# Patient Record
Sex: Female | Born: 1940 | Race: White | Hispanic: No | Smoking: Never smoker
Health system: Southern US, Community
[De-identification: ages and names within clinical notes are randomized; demographics above are authoritative.]

## PROBLEM LIST (undated history)

## (undated) DIAGNOSIS — I482 Chronic atrial fibrillation, unspecified: Secondary | ICD-10-CM

## (undated) DIAGNOSIS — I10 Essential (primary) hypertension: Secondary | ICD-10-CM

## (undated) HISTORY — DX: Chronic atrial fibrillation, unspecified: I48.20

## (undated) HISTORY — DX: Essential (primary) hypertension: I10

## (undated) HISTORY — PX: OTHER SURGICAL HISTORY: SHX169

---

## 2001-11-09 ENCOUNTER — Encounter: Payer: Self-pay | Admitting: Ophthalmology

## 2001-11-09 ENCOUNTER — Ambulatory Visit (HOSPITAL_COMMUNITY): Admission: RE | Admit: 2001-11-09 | Discharge: 2001-11-10 | Payer: Self-pay | Admitting: Ophthalmology

## 2003-09-19 ENCOUNTER — Ambulatory Visit (HOSPITAL_COMMUNITY): Admission: RE | Admit: 2003-09-19 | Discharge: 2003-09-19 | Payer: Self-pay | Admitting: Family Medicine

## 2014-09-19 DIAGNOSIS — H52222 Regular astigmatism, left eye: Secondary | ICD-10-CM | POA: Diagnosis not present

## 2014-09-19 DIAGNOSIS — H338 Other retinal detachments: Secondary | ICD-10-CM | POA: Diagnosis not present

## 2014-09-19 DIAGNOSIS — H5202 Hypermetropia, left eye: Secondary | ICD-10-CM | POA: Diagnosis not present

## 2014-09-20 DIAGNOSIS — R5383 Other fatigue: Secondary | ICD-10-CM | POA: Diagnosis not present

## 2014-09-20 DIAGNOSIS — I10 Essential (primary) hypertension: Secondary | ICD-10-CM | POA: Diagnosis not present

## 2014-09-20 DIAGNOSIS — Z6823 Body mass index (BMI) 23.0-23.9, adult: Secondary | ICD-10-CM | POA: Diagnosis not present

## 2014-09-20 DIAGNOSIS — F419 Anxiety disorder, unspecified: Secondary | ICD-10-CM | POA: Diagnosis not present

## 2014-10-03 DIAGNOSIS — Z6823 Body mass index (BMI) 23.0-23.9, adult: Secondary | ICD-10-CM | POA: Diagnosis not present

## 2014-10-03 DIAGNOSIS — F321 Major depressive disorder, single episode, moderate: Secondary | ICD-10-CM | POA: Diagnosis not present

## 2014-10-18 ENCOUNTER — Other Ambulatory Visit (HOSPITAL_COMMUNITY): Payer: Self-pay | Admitting: Family Medicine

## 2014-10-19 ENCOUNTER — Other Ambulatory Visit: Payer: Self-pay | Admitting: Family Medicine

## 2014-10-19 DIAGNOSIS — R0989 Other specified symptoms and signs involving the circulatory and respiratory systems: Secondary | ICD-10-CM

## 2014-10-20 ENCOUNTER — Other Ambulatory Visit: Payer: Self-pay | Admitting: Family Medicine

## 2014-10-23 ENCOUNTER — Ambulatory Visit
Admission: RE | Admit: 2014-10-23 | Discharge: 2014-10-23 | Disposition: A | Discharge status: Home / Self Care | Payer: Commercial Managed Care - HMO | Source: Ambulatory Visit | Attending: Family Medicine | Admitting: Family Medicine

## 2014-10-23 DIAGNOSIS — R0989 Other specified symptoms and signs involving the circulatory and respiratory systems: Secondary | ICD-10-CM

## 2014-10-23 DIAGNOSIS — E042 Nontoxic multinodular goiter: Secondary | ICD-10-CM | POA: Diagnosis not present

## 2014-11-01 ENCOUNTER — Ambulatory Visit (HOSPITAL_COMMUNITY): Payer: Commercial Managed Care - HMO

## 2014-11-01 ENCOUNTER — Other Ambulatory Visit (HOSPITAL_COMMUNITY): Payer: Self-pay | Admitting: Internal Medicine

## 2014-11-01 ENCOUNTER — Ambulatory Visit
Admission: RE | Admit: 2014-11-01 | Discharge: 2014-11-01 | Disposition: A | Discharge status: Home / Self Care | Payer: Commercial Managed Care - HMO | Source: Ambulatory Visit | Attending: Internal Medicine | Admitting: Internal Medicine

## 2014-11-01 DIAGNOSIS — R011 Cardiac murmur, unspecified: Secondary | ICD-10-CM

## 2014-11-01 DIAGNOSIS — I10 Essential (primary) hypertension: Secondary | ICD-10-CM | POA: Diagnosis not present

## 2014-11-01 DIAGNOSIS — R0989 Other specified symptoms and signs involving the circulatory and respiratory systems: Secondary | ICD-10-CM | POA: Diagnosis not present

## 2014-11-01 DIAGNOSIS — F419 Anxiety disorder, unspecified: Secondary | ICD-10-CM | POA: Diagnosis not present

## 2014-11-01 DIAGNOSIS — Z6823 Body mass index (BMI) 23.0-23.9, adult: Secondary | ICD-10-CM | POA: Diagnosis not present

## 2014-11-01 DIAGNOSIS — E781 Pure hyperglyceridemia: Secondary | ICD-10-CM | POA: Diagnosis not present

## 2014-11-01 DIAGNOSIS — F329 Major depressive disorder, single episode, unspecified: Secondary | ICD-10-CM | POA: Diagnosis not present

## 2015-03-16 DIAGNOSIS — Z6821 Body mass index (BMI) 21.0-21.9, adult: Secondary | ICD-10-CM | POA: Diagnosis not present

## 2015-03-16 DIAGNOSIS — Z1389 Encounter for screening for other disorder: Secondary | ICD-10-CM | POA: Diagnosis not present

## 2015-03-16 DIAGNOSIS — L989 Disorder of the skin and subcutaneous tissue, unspecified: Secondary | ICD-10-CM | POA: Diagnosis not present

## 2015-03-21 DIAGNOSIS — B9689 Other specified bacterial agents as the cause of diseases classified elsewhere: Secondary | ICD-10-CM | POA: Diagnosis not present

## 2015-03-21 DIAGNOSIS — L02821 Furuncle of head [any part, except face]: Secondary | ICD-10-CM | POA: Diagnosis not present

## 2015-03-21 DIAGNOSIS — L281 Prurigo nodularis: Secondary | ICD-10-CM | POA: Diagnosis not present

## 2015-03-27 DIAGNOSIS — F329 Major depressive disorder, single episode, unspecified: Secondary | ICD-10-CM | POA: Diagnosis not present

## 2015-03-27 DIAGNOSIS — I10 Essential (primary) hypertension: Secondary | ICD-10-CM | POA: Diagnosis not present

## 2015-03-27 DIAGNOSIS — F419 Anxiety disorder, unspecified: Secondary | ICD-10-CM | POA: Diagnosis not present

## 2015-03-27 DIAGNOSIS — Z6821 Body mass index (BMI) 21.0-21.9, adult: Secondary | ICD-10-CM | POA: Diagnosis not present

## 2015-03-27 DIAGNOSIS — Z1211 Encounter for screening for malignant neoplasm of colon: Secondary | ICD-10-CM | POA: Diagnosis not present

## 2015-03-27 DIAGNOSIS — E559 Vitamin D deficiency, unspecified: Secondary | ICD-10-CM | POA: Diagnosis not present

## 2015-03-27 DIAGNOSIS — Z1389 Encounter for screening for other disorder: Secondary | ICD-10-CM | POA: Diagnosis not present

## 2015-04-19 DIAGNOSIS — L570 Actinic keratosis: Secondary | ICD-10-CM | POA: Diagnosis not present

## 2015-04-19 DIAGNOSIS — L720 Epidermal cyst: Secondary | ICD-10-CM | POA: Diagnosis not present

## 2015-04-19 DIAGNOSIS — L821 Other seborrheic keratosis: Secondary | ICD-10-CM | POA: Diagnosis not present

## 2015-04-27 DIAGNOSIS — F419 Anxiety disorder, unspecified: Secondary | ICD-10-CM | POA: Diagnosis not present

## 2015-04-27 DIAGNOSIS — Z1389 Encounter for screening for other disorder: Secondary | ICD-10-CM | POA: Diagnosis not present

## 2015-04-27 DIAGNOSIS — F329 Major depressive disorder, single episode, unspecified: Secondary | ICD-10-CM | POA: Diagnosis not present

## 2015-04-27 DIAGNOSIS — I10 Essential (primary) hypertension: Secondary | ICD-10-CM | POA: Diagnosis not present

## 2015-04-27 DIAGNOSIS — H3320 Serous retinal detachment, unspecified eye: Secondary | ICD-10-CM | POA: Diagnosis not present

## 2015-04-27 DIAGNOSIS — Z6821 Body mass index (BMI) 21.0-21.9, adult: Secondary | ICD-10-CM | POA: Diagnosis not present

## 2015-05-07 DIAGNOSIS — H43812 Vitreous degeneration, left eye: Secondary | ICD-10-CM | POA: Diagnosis not present

## 2015-05-07 DIAGNOSIS — H35351 Cystoid macular degeneration, right eye: Secondary | ICD-10-CM | POA: Diagnosis not present

## 2015-05-07 DIAGNOSIS — H35372 Puckering of macula, left eye: Secondary | ICD-10-CM | POA: Diagnosis not present

## 2015-05-07 DIAGNOSIS — H33312 Horseshoe tear of retina without detachment, left eye: Secondary | ICD-10-CM | POA: Diagnosis not present

## 2015-07-20 DIAGNOSIS — L218 Other seborrheic dermatitis: Secondary | ICD-10-CM | POA: Diagnosis not present

## 2015-11-14 DIAGNOSIS — Z6821 Body mass index (BMI) 21.0-21.9, adult: Secondary | ICD-10-CM | POA: Diagnosis not present

## 2015-11-14 DIAGNOSIS — F330 Major depressive disorder, recurrent, mild: Secondary | ICD-10-CM | POA: Diagnosis not present

## 2015-11-14 DIAGNOSIS — F419 Anxiety disorder, unspecified: Secondary | ICD-10-CM | POA: Diagnosis not present

## 2015-11-14 DIAGNOSIS — Z0001 Encounter for general adult medical examination with abnormal findings: Secondary | ICD-10-CM | POA: Diagnosis not present

## 2015-11-14 DIAGNOSIS — Z1389 Encounter for screening for other disorder: Secondary | ICD-10-CM | POA: Diagnosis not present

## 2015-11-14 DIAGNOSIS — I10 Essential (primary) hypertension: Secondary | ICD-10-CM | POA: Diagnosis not present

## 2015-11-14 DIAGNOSIS — F329 Major depressive disorder, single episode, unspecified: Secondary | ICD-10-CM | POA: Diagnosis not present

## 2015-12-19 DIAGNOSIS — H33312 Horseshoe tear of retina without detachment, left eye: Secondary | ICD-10-CM | POA: Diagnosis not present

## 2015-12-19 DIAGNOSIS — H43812 Vitreous degeneration, left eye: Secondary | ICD-10-CM | POA: Diagnosis not present

## 2015-12-19 DIAGNOSIS — H35351 Cystoid macular degeneration, right eye: Secondary | ICD-10-CM | POA: Diagnosis not present

## 2015-12-19 DIAGNOSIS — H35373 Puckering of macula, bilateral: Secondary | ICD-10-CM | POA: Diagnosis not present

## 2016-04-25 DIAGNOSIS — S93402A Sprain of unspecified ligament of left ankle, initial encounter: Secondary | ICD-10-CM | POA: Diagnosis not present

## 2016-04-25 DIAGNOSIS — Z681 Body mass index (BMI) 19 or less, adult: Secondary | ICD-10-CM | POA: Diagnosis not present

## 2016-05-21 ENCOUNTER — Emergency Department (HOSPITAL_COMMUNITY): Payer: Commercial Managed Care - HMO

## 2016-05-21 ENCOUNTER — Emergency Department (HOSPITAL_COMMUNITY)
Admission: EM | Admit: 2016-05-21 | Discharge: 2016-05-21 | Disposition: A | Discharge status: Home / Self Care | Payer: Commercial Managed Care - HMO | Attending: Emergency Medicine | Admitting: Emergency Medicine

## 2016-05-21 ENCOUNTER — Encounter (HOSPITAL_COMMUNITY): Payer: Self-pay

## 2016-05-21 DIAGNOSIS — Y999 Unspecified external cause status: Secondary | ICD-10-CM | POA: Insufficient documentation

## 2016-05-21 DIAGNOSIS — W109XXA Fall (on) (from) unspecified stairs and steps, initial encounter: Secondary | ICD-10-CM | POA: Diagnosis not present

## 2016-05-21 DIAGNOSIS — Y9301 Activity, walking, marching and hiking: Secondary | ICD-10-CM | POA: Insufficient documentation

## 2016-05-21 DIAGNOSIS — Z79899 Other long term (current) drug therapy: Secondary | ICD-10-CM | POA: Insufficient documentation

## 2016-05-21 DIAGNOSIS — S8391XA Sprain of unspecified site of right knee, initial encounter: Secondary | ICD-10-CM | POA: Diagnosis not present

## 2016-05-21 DIAGNOSIS — M25561 Pain in right knee: Secondary | ICD-10-CM | POA: Diagnosis not present

## 2016-05-21 DIAGNOSIS — Y929 Unspecified place or not applicable: Secondary | ICD-10-CM | POA: Diagnosis not present

## 2016-05-21 DIAGNOSIS — S8991XA Unspecified injury of right lower leg, initial encounter: Secondary | ICD-10-CM | POA: Diagnosis not present

## 2016-05-21 MED ORDER — NAPROXEN 500 MG PO TABS: 500.0000 mg | ORAL_TABLET | Freq: Two times a day (BID) | ORAL | 0 refills | Status: DC

## 2016-05-21 NOTE — ED Triage Notes (Signed)
Pt reports was walking down the stairs yesterday, missed the last step, and fell.  C/O pain to r knee.  Swelling noted.

## 2016-05-21 NOTE — ED Provider Notes (Signed)
[facility] Provider Note   CSN: [id number] Arrival date & time: 05/21/16  1823     History   Chief Complaint Chief Complaint  Patient presents with  . Fall    HPI Lauren Baker is a 75 y.o. female presenting for evaluation of persistent right knee pain and posterior knee swelling since falling yesterday evening.  She was going down a flight of steps when she missed the last step causing a forward fall landing directly on the flexed knee onto hard flooring but believes she twisted it during the fall. She has been able to ambulate and denies popping, clicking or deformity but woke with worsened pain today.  There is no radiation of pain. She has used ice today with some relief.  She denies other injury including head injury and had no loc or dizziness during the event.  The history is provided by the patient and a relative.    History reviewed. No pertinent past medical history.  There are no active problems to display for this patient.   Past Surgical History:  Procedure Laterality Date  . detatched retina      OB History    No data available       Home Medications    Prior to Admission medications   Medication Sig Start Date End Date Taking? Authorizing Provider  naproxen (NAPROSYN) 500 MG tablet Take 1 tablet (500 mg total) by mouth 2 (two) times daily. 05/21/16   Burgess Amor, PA-C    Family History No family history on file.  Social History Social History  Substance Use Topics  . Smoking status: Never Smoker  . Smokeless tobacco: Never Used  . Alcohol use No     Allergies   Review of patient's allergies indicates no known allergies.   Review of Systems Review of Systems  Constitutional: Negative for fever.  Musculoskeletal: Positive for arthralgias and joint swelling. Negative for myalgias.  Neurological: Negative for weakness and numbness.     Physical Exam Updated Vital Signs BP 163/94   Pulse 93   Temp 98.4 F (36.9 C) (Oral)    Resp 18   Ht 5\' 9"  (1.753 m)   Wt 72.6 kg   SpO2 99%   BMI 23.63 kg/m   Physical Exam  Constitutional: She appears well-developed and well-nourished.  HENT:  Head: Atraumatic.  Neck: Normal range of motion.  Cardiovascular:  Pulses equal bilaterally  Musculoskeletal: She exhibits tenderness.       Right knee: She exhibits swelling. She exhibits no effusion, no ecchymosis, no erythema, normal alignment, no LCL laxity, no bony tenderness, normal meniscus and no MCL laxity.  Joint appears stable with no pain or laxity with valgus or varus strain. ttp with swelling noted in popliteal space.  No crepitus with ROM of joint.   Neurological: She is alert. She has normal strength. She displays normal reflexes. No sensory deficit.  Skin: Skin is warm and dry.  Psychiatric: She has a normal mood and affect.     ED Treatments / Results  Labs (all labs ordered are listed, but only abnormal results are displayed) Labs Reviewed - No data to display  EKG  EKG Interpretation None       Radiology Dg Knee Complete 4 Views Right  Result Date: 05/21/2016 CLINICAL DATA:  Recent fall with knee pain, initial encounter EXAM: RIGHT KNEE - COMPLETE 4+ VIEW COMPARISON:  None. FINDINGS: No evidence of fracture, dislocation, or joint effusion. No evidence of arthropathy or other focal  bone abnormality. Soft tissues are unremarkable. IMPRESSION: No acute abnormality noted. Electronically Signed   By: Alcide CleverMark  Lukens M.D.   On: 05/21/2016 19:12    Procedures Procedures (including critical care time)  Medications Ordered in ED Medications - No data to display   Initial Impression / Assessment and Plan / ED Course  I have reviewed the triage vital signs and the nursing notes.  Pertinent labs & imaging results that were available during my care of the patient were reviewed by me and considered in my medical decision making (see chart for details).  Clinical Course    Imaging reviewed and  negative for fracture. No obvious ligament tear or meniscal injury on todays exam.  Pt was advised RICE,  Ace provided.  Plan f/u with her pcp in one week for a recheck for any worsened or persistent sx.   Final Clinical Impressions(s) / ED Diagnoses   Final diagnoses:  Sprain of right knee, unspecified ligament, initial encounter    New Prescriptions New Prescriptions   NAPROXEN (NAPROSYN) 500 MG TABLET    Take 1 tablet (500 mg total) by mouth 2 (two) times daily.     Burgess AmorJulie Ziya Coonrod, PA-C 05/21/16 1936    Bethann BerkshireJoseph Zammit, MD 05/21/16 [phone]

## 2016-05-21 NOTE — Discharge Instructions (Signed)
Wear the ace wrap and minimize activities that worsen your pain.  Use ice and elevation as much as possible for the next several days to help reduce the swelling.  Take the medication prescribed for pain and inflammation.  Call your doctor listed for a recheck of your injury in 1 week if not improving.  Your xrays are negative today.

## 2016-11-24 DIAGNOSIS — Z1389 Encounter for screening for other disorder: Secondary | ICD-10-CM | POA: Diagnosis not present

## 2016-11-24 DIAGNOSIS — Z7689 Persons encountering health services in other specified circumstances: Secondary | ICD-10-CM | POA: Diagnosis not present

## 2016-11-24 DIAGNOSIS — F419 Anxiety disorder, unspecified: Secondary | ICD-10-CM | POA: Diagnosis not present

## 2016-11-24 DIAGNOSIS — F330 Major depressive disorder, recurrent, mild: Secondary | ICD-10-CM | POA: Diagnosis not present

## 2016-11-24 DIAGNOSIS — Z6823 Body mass index (BMI) 23.0-23.9, adult: Secondary | ICD-10-CM | POA: Diagnosis not present

## 2017-05-27 DIAGNOSIS — F419 Anxiety disorder, unspecified: Secondary | ICD-10-CM | POA: Diagnosis not present

## 2017-05-27 DIAGNOSIS — Z79891 Long term (current) use of opiate analgesic: Secondary | ICD-10-CM | POA: Diagnosis not present

## 2017-05-27 DIAGNOSIS — I10 Essential (primary) hypertension: Secondary | ICD-10-CM | POA: Diagnosis not present

## 2017-05-27 DIAGNOSIS — Z6823 Body mass index (BMI) 23.0-23.9, adult: Secondary | ICD-10-CM | POA: Diagnosis not present

## 2017-09-22 DIAGNOSIS — F419 Anxiety disorder, unspecified: Secondary | ICD-10-CM | POA: Diagnosis not present

## 2017-09-22 DIAGNOSIS — Z6823 Body mass index (BMI) 23.0-23.9, adult: Secondary | ICD-10-CM | POA: Diagnosis not present

## 2018-03-10 DIAGNOSIS — Z1389 Encounter for screening for other disorder: Secondary | ICD-10-CM | POA: Diagnosis not present

## 2018-03-10 DIAGNOSIS — Z6824 Body mass index (BMI) 24.0-24.9, adult: Secondary | ICD-10-CM | POA: Diagnosis not present

## 2018-03-10 DIAGNOSIS — F330 Major depressive disorder, recurrent, mild: Secondary | ICD-10-CM | POA: Diagnosis not present

## 2018-03-10 DIAGNOSIS — Z0001 Encounter for general adult medical examination with abnormal findings: Secondary | ICD-10-CM | POA: Diagnosis not present

## 2018-03-10 DIAGNOSIS — I4891 Unspecified atrial fibrillation: Secondary | ICD-10-CM | POA: Diagnosis not present

## 2018-03-10 DIAGNOSIS — F329 Major depressive disorder, single episode, unspecified: Secondary | ICD-10-CM | POA: Diagnosis not present

## 2018-03-10 DIAGNOSIS — I499 Cardiac arrhythmia, unspecified: Secondary | ICD-10-CM | POA: Diagnosis not present

## 2018-03-10 DIAGNOSIS — E782 Mixed hyperlipidemia: Secondary | ICD-10-CM | POA: Diagnosis not present

## 2018-03-10 DIAGNOSIS — I10 Essential (primary) hypertension: Secondary | ICD-10-CM | POA: Diagnosis not present

## 2018-03-10 DIAGNOSIS — Z13220 Encounter for screening for lipoid disorders: Secondary | ICD-10-CM | POA: Diagnosis not present

## 2018-03-12 ENCOUNTER — Encounter: Payer: Self-pay | Admitting: Cardiovascular Disease

## 2018-03-12 ENCOUNTER — Telehealth: Payer: Self-pay | Admitting: Cardiovascular Disease

## 2018-03-12 ENCOUNTER — Ambulatory Visit: Payer: Medicare HMO | Admitting: Cardiovascular Disease

## 2018-03-12 DIAGNOSIS — I482 Chronic atrial fibrillation, unspecified: Secondary | ICD-10-CM | POA: Insufficient documentation

## 2018-03-12 DIAGNOSIS — R0989 Other specified symptoms and signs involving the circulatory and respiratory systems: Secondary | ICD-10-CM

## 2018-03-12 DIAGNOSIS — I10 Essential (primary) hypertension: Secondary | ICD-10-CM | POA: Diagnosis not present

## 2018-03-12 MED ORDER — METOPROLOL SUCCINATE ER 25 MG PO TB24: 75.0000 mg | ORAL_TABLET | Freq: Every day | ORAL | 3 refills | Status: DC

## 2018-03-12 MED ORDER — LOSARTAN POTASSIUM 25 MG PO TABS: 25.0000 mg | ORAL_TABLET | Freq: Every day | ORAL | 3 refills | Status: DC

## 2018-03-12 MED ORDER — APIXABAN 5 MG PO TABS: 5.0000 mg | ORAL_TABLET | Freq: Two times a day (BID) | ORAL | 6 refills | Status: DC

## 2018-03-12 NOTE — Progress Notes (Signed)
03/12/2018 Lauren Baker   01/23/1941  [id number]  Primary Physician Elfredia Nevins, MD Primary Cardiologist: Runell Gess MD Milagros Loll, [place], [place]  HPI:  Lauren Baker is a 77 y.o. moderately overweight widowed Caucasian female mother of 4, grandmother of 3 grandchildren who is accompanied by her daughter Lauren Baker.  She was referred by Dr. Sherwood Gambler .  She has no cardiovascular risk factors other than family history with 2 siblings who have had stents.  She is never had a heart attack or stroke.  She denies chest pain or shortness of breath.  Was recently found to be in A. fib and was referred here for further evaluation.  She is unaware that she is in A. fib and the chronicity is unclear.   Current Meds  Medication Sig  . aspirin 81 MG tablet Take 81 mg by mouth daily.  Marland Kitchen buPROPion (WELLBUTRIN) 100 MG tablet Take 100 mg by mouth 3 (three) times daily.  . clonazePAM (KLONOPIN) 0.5 MG tablet Take 0.5 mg by mouth 2 (two) times daily.  . DULoxetine (CYMBALTA) 30 MG capsule Take by mouth daily.  . metoprolol succinate (TOPROL-XL) 25 MG 24 hr tablet Take 3 tablets (75 mg total) by mouth daily.  . QUEtiapine (SEROQUEL) 50 MG tablet Take 50 mg by mouth daily.  . [DISCONTINUED] metoprolol succinate (TOPROL-XL) 50 MG 24 hr tablet Take 50 mg by mouth daily.     No Known Allergies  Social History   Socioeconomic History  . Marital status: Married    Spouse name: Not on file  . Number of children: Not on file  . Years of education: Not on file  . Highest education level: Not on file  Occupational History  . Not on file  Social Needs  . Financial resource strain: Not on file  . Food insecurity:    Worry: Not on file    Inability: Not on file  . Transportation needs:    Medical: Not on file    Non-medical: Not on file  Tobacco Use  . Smoking status: Never Smoker  . Smokeless tobacco: Never Used  Substance and Sexual Activity  . Alcohol use: No  . Drug use: No    . Sexual activity: Not on file  Lifestyle  . Physical activity:    Days per week: Not on file    Minutes per session: Not on file  . Stress: Not on file  Relationships  . Social connections:    Talks on phone: Not on file    Gets together: Not on file    Attends religious service: Not on file    Active member of club or organization: Not on file    Attends meetings of clubs or organizations: Not on file    Relationship status: Not on file  . Intimate partner violence:    Fear of current or ex partner: Not on file    Emotionally abused: Not on file    Physically abused: Not on file    Forced sexual activity: Not on file  Other Topics Concern  . Not on file  Social History Narrative  . Not on file     Review of Systems: General: negative for chills, fever, night sweats or weight changes.  Cardiovascular: negative for chest pain, dyspnea on exertion, edema, orthopnea, palpitations, paroxysmal nocturnal dyspnea or shortness of breath Dermatological: negative for rash Respiratory: negative for cough or wheezing Urologic: negative for hematuria Abdominal: negative for nausea, vomiting, diarrhea,  bright red blood per rectum, melena, or hematemesis Neurologic: negative for visual changes, syncope, or dizziness All other systems reviewed and are otherwise negative except as noted above.    Blood pressure (!) 176/133, pulse (!) 116, height 5\' 8"  (1.727 m), weight 174 lb 12.8 oz (79.3 kg).  General appearance: alert and no distress Neck: no adenopathy, no carotid bruit, no JVD, supple, symmetrical, trachea midline and thyroid not enlarged, symmetric, no tenderness/mass/nodules Lungs: clear to auscultation bilaterally Heart: regular rate and rhythm, S1, S2 normal, no murmur, click, rub or gallop Extremities: extremities normal, atraumatic, no cyanosis or edema Pulses: 2+ and symmetric Skin: Skin color, texture, turgor normal. No rashes or lesions Neurologic: Alert and oriented X 3,  normal strength and tone. Normal symmetric reflexes. Normal coordination and gait  EKG atrial fibrillation with a ventricular response of 116, RSR prime in lead V1 and V2 consistent with RV conduction delay.  I personally reviewed this EKG.  ASSESSMENT AND PLAN:   Chronic atrial fibrillation (HCC) This Lauren Baker was referred to me by Dr. Carlena SaxFosco for evaluation treatment of chronic A. fib.  She is unaware that she is in this.  It is unclear how chronic this is.Her CHAD2VASc is at least 3 for gender and age.  She will need to be started on a oral anticoagulant.  I am going to increase her metoprolol from 50 to 75 mg for rate control.  I am also going to get a 2D echocardiogram.  Essential hypertension Patient does not have a history of essential hypertension although her blood pressure today is 176/133.  I suspect this is related to anxiety.  I am going to increase her metoprolol for rate control and I am going to add low-dose losartan for blood pressure.  We will check a basic metabolic panel in 2 weeks and have her follow-up in [place] in 1 month.      Runell GessJonathan J. Swan Zayed MD FACP,FACC,FAHA, [facility] 03/12/2018 11:54 AM

## 2018-03-12 NOTE — Assessment & Plan Note (Signed)
Patient does not have a history of essential hypertension although her blood pressure today is 176/133.  I suspect this is related to anxiety.  I am going to increase her metoprolol for rate control and I am going to add low-dose losartan for blood pressure.  We will check a basic metabolic panel in 2 weeks and have her follow-up in [place] in 1 month.

## 2018-03-12 NOTE — Telephone Encounter (Signed)
New Message          Pt c/o medication issue:  1. Name of Medication: Losartan  2. How are you currently taking this medication (dosage and times per day)? 1 a day 25 mg  3. Are you having a reaction (difficulty breathing--STAT)? No  4. What is your medication issue? Patient is already  taken "metoprolol" 50 mg, patient's daughter needs to know if she should take the Losartan as well? Pls advise.

## 2018-03-12 NOTE — Assessment & Plan Note (Addendum)
This Lauren Baker was referred to me by Dr. Carlena SaxFosco for evaluation treatment of chronic A. fib.  She is unaware that she is in this.  It is unclear how chronic this is.Her CHAD2VASc is at least 3 for gender and age.  She will need to be started on a oral anticoagulant.  I am going to increase her metoprolol from 50 to 75 mg for rate control.  I am also going to get a 2D echocardiogram.

## 2018-03-12 NOTE — Patient Instructions (Addendum)
Medication Instructions:   INCREASE METOPROLOL SUCC ER TO 75 MG ONCE DAILY=1 AND 1/2 OF THE 50 MG TABLET ONCE DAILY=3 OF THE 25 MG TABLETS ONCE DAILY  START LOSARTAN 25 MG ONCE DAILY  START ELIQUIS 5 MG ONE TABLET TWICE DAILY  Labwork:  Your physician recommends that you return for lab work in: 2 WEEKS  Follow-Up:  Your physician recommends that you schedule a follow-up appointment in: AS NEEDED WITH DR Allyson SabalBERRY  Your physician recommends that you schedule a follow-up appointment in: 1 MONTH WITH [facility] IN [place]    .If you need a refill on your cardiac medications before your next appointment, please call your pharmacy.

## 2018-03-12 NOTE — Telephone Encounter (Signed)
Patient daughter called, I went over office note to daughter advising of the new medications.  Concerned about taking aspirin 81 mg along with new Eliquis. She would like to know if she should continue both, as she just started the Aspirin 81 mg yesterday 03/11/18, and then was placed on the Eliquis at today's visit.   Please advise.  Thanks!

## 2018-03-15 ENCOUNTER — Telehealth: Payer: Self-pay | Admitting: Cardiovascular Disease

## 2018-03-15 NOTE — Telephone Encounter (Signed)
Spoke with pt dtr, aware no doppler is needed but she reports the patient wants it done to ease her mind and would like the test done anyway. Carotid and echo scheduled.

## 2018-03-15 NOTE — Telephone Encounter (Signed)
There is no indication for her to be on aspirin since she is on Eliquis and has no evidence of ischemic heart disease.

## 2018-03-15 NOTE — Telephone Encounter (Signed)
Spoke with pt dtr, aware for patient to stop aspirin.

## 2018-03-15 NOTE — Telephone Encounter (Signed)
Spoke with the patients daughter and she is asking if the patient should be on Aspirin 81mg  now that she has been started on Eliquis. She is also requesting that the pt has a follow up carotid ultrasound to follow up from the ultrasound she had in 2016 which showed some thickening. The patient is having anxiety about her carotids. She has not had any dizziness, headache or any syncope but has had eye trouble and her Eye MD mentioned may be good idea to have follow up ultrasound. I advised her that I will speak with Dr. Allyson SabalBerry to see if okay to order carotid ultrasound and if he would like her to take Aspirin or not. She agreed and will wait for me to call he back.

## 2018-03-15 NOTE — Telephone Encounter (Signed)
Her carotid Dopplers were normal back in 2016.  No reason to repeat

## 2018-03-15 NOTE — Telephone Encounter (Signed)
New message  Pt c/o medication issue:  1. Name of Medication:losartan (COZAAR) 25 MG tablet and  aspirin 81 MG tablet  2. How are you currently taking this medication (dosage and times per day)?  1 time per day   3. Are you having a reaction (difficulty breathing--STAT)? No   4. What is your medication issue?  Patient's daughter needs to know if she needs to take the losartan (COZAAR) 25 MG tablet with other medications the patient is taking. The patient's daughter wants to know if she needs to start taking the [employer] Aspirin on a daily basis.

## 2018-03-17 ENCOUNTER — Other Ambulatory Visit: Payer: Self-pay | Admitting: *Deleted

## 2018-03-17 ENCOUNTER — Other Ambulatory Visit: Payer: Self-pay

## 2018-03-17 ENCOUNTER — Ambulatory Visit (HOSPITAL_COMMUNITY)
Admission: RE | Admit: 2018-03-17 | Discharge: 2018-03-17 | Disposition: A | Discharge status: Home / Self Care | Payer: Medicare HMO | Source: Ambulatory Visit | Attending: Internal Medicine | Admitting: Internal Medicine

## 2018-03-17 ENCOUNTER — Encounter (HOSPITAL_COMMUNITY): Payer: Self-pay

## 2018-03-17 ENCOUNTER — Ambulatory Visit (HOSPITAL_BASED_OUTPATIENT_CLINIC_OR_DEPARTMENT_OTHER): Payer: Medicare HMO

## 2018-03-17 DIAGNOSIS — I482 Chronic atrial fibrillation, unspecified: Secondary | ICD-10-CM

## 2018-03-17 DIAGNOSIS — R0989 Other specified symptoms and signs involving the circulatory and respiratory systems: Secondary | ICD-10-CM | POA: Diagnosis not present

## 2018-03-17 MED ORDER — METOPROLOL SUCCINATE ER 100 MG PO TB24: 100.0000 mg | ORAL_TABLET | Freq: Every day | ORAL | 3 refills | Status: DC

## 2018-03-17 NOTE — Progress Notes (Signed)
Ms. Ericka PontiffMontgomery presented for an echocardiogram this afternoon ordered by Dr. Allyson SabalBerry. Patient was noted having HR 120-150bpm. DOD (Dr. Anne FuSkains) was notified and he upped Ms. Decuir's Metoprolol to 100mg . She is safe to be discharged home.  Dewitt HoesBilly Lyrique Hakim, RDCS

## 2018-03-17 NOTE — Progress Notes (Signed)
Increase Metoprolol to 100 mg daily d/t tachycardia during 2 D Echo.

## 2018-03-26 DIAGNOSIS — I10 Essential (primary) hypertension: Secondary | ICD-10-CM | POA: Diagnosis not present

## 2018-03-27 LAB — BASIC METABOLIC PANEL
BUN/Creatinine Ratio: 9 — ABNORMAL LOW (ref 12–28)
BUN: 9 mg/dL (ref 8–27)
CHLORIDE: 104 mmol/L (ref 96–106)
CO2: 26 mmol/L (ref 20–29)
Calcium: 9.2 mg/dL (ref 8.7–10.3)
Creatinine, Ser: 1.05 mg/dL — ABNORMAL HIGH (ref 0.57–1.00)
GFR, EST AFRICAN AMERICAN: 60 mL/min/{1.73_m2} (ref >59)
GFR, EST NON AFRICAN AMERICAN: 52 mL/min/{1.73_m2} — AB (ref >59)
Glucose: 129 mg/dL — ABNORMAL HIGH (ref 65–99)
POTASSIUM: 3.8 mmol/L (ref 3.5–5.2)
SODIUM: 145 mmol/L — AB (ref 134–144)

## 2018-04-01 ENCOUNTER — Telehealth: Payer: Self-pay | Admitting: Cardiovascular Disease

## 2018-04-01 DIAGNOSIS — I10 Essential (primary) hypertension: Secondary | ICD-10-CM

## 2018-04-01 MED ORDER — LOSARTAN POTASSIUM 25 MG PO TABS: 50.0000 mg | ORAL_TABLET | Freq: Every day | ORAL | 3 refills | Status: DC

## 2018-04-01 NOTE — Telephone Encounter (Signed)
Thanks, agree with medication change   Dominga FerryJ Branch MD

## 2018-04-01 NOTE — Telephone Encounter (Signed)
New Message:   Pt daughter has a question before mother next appt.

## 2018-04-01 NOTE — Telephone Encounter (Signed)
Okay to increase losartan to 50mg  daily and keep appt for 04/16/2018.  Will need to repeat BMET during next OV

## 2018-04-01 NOTE — Telephone Encounter (Signed)
Returned call to daughter-advised of recommendations and verbalized understanding.   Daughter continues to be concerned about "overdosing" her mother on medication and concerned about taking both medications at the same time.  Discussed the purpose of medications and reassured daughter.    She states they will increase and continue to monitor.  Routed to Dr. Wyline MoodBranch and primary nurse to make [facility]

## 2018-04-01 NOTE — Telephone Encounter (Signed)
Returned call to daughter (ok per DPR)-she states concerns with patients consistently elevated BP.   She states last night her BP was 147/1151 HR 108.  She does not have any other readings to provide but states this is the highest it has been.  She knows her BP and HR were elevated at the office and understands that this is why the medication was added/increased.    She was wondering if it was okay to wait until the appt on 9/6 or if something needed to be changed prior.   Advised it would be very helpful if she could provide additional BP/HR readings as we typically do not change medication based on one elevated reading.    Daughter called patient while on phone and provided additional readings:   128/98 144/101, states HR typically 100-110, no additional readings to report.   She states patient has severe anxiety and depression and thinks this is contributing to the readings.    She also wanted to verify it was okay to take losartan and metoprolol at the same time, advised this was okay.    Patient taking metoprolol succinate 100 mg daily and losartan 25 mg daily.   Advised would route to MD and pharmD to review and advise.

## 2018-04-13 ENCOUNTER — Telehealth: Payer: Self-pay | Admitting: Cardiovascular Disease

## 2018-04-13 NOTE — Telephone Encounter (Signed)
Agree with nursing assessment. 

## 2018-04-13 NOTE — Telephone Encounter (Signed)
Returned call to patient's daughter who saw Dr. Allyson Sabal 8/2 and was started on eliquis for AF. She has had spotting twice on separate days like a menstrual cycle - not heavy bleeding. Advised that patient should continue medication and contact OB-GYN for any additional gynecological concerns. Will notify MD, CVRR for any further advice.

## 2018-04-13 NOTE — Telephone Encounter (Signed)
Pt c/o medication issue: 1. Name of Medication: Eliquis  2. How are you currently taking this medication (dosage and times per day)? Think she takes twice a day 9 am and 9 pm 3. Are you having a reaction (difficulty breathing--STAT)?  No   4. What is your medication issue? The patient  Was spotting twice as if it was like a menstrual.   Patient daughter because she has some concerns.

## 2018-04-13 NOTE — Telephone Encounter (Signed)
Returned call to daughter and advised no new recommendations. Monitor for any additional bleeding that is more frequent or heavier and seek gynecological care as needed.

## 2018-04-16 ENCOUNTER — Encounter: Payer: Self-pay | Admitting: Cardiology

## 2018-04-16 ENCOUNTER — Ambulatory Visit: Payer: Medicare HMO | Admitting: Cardiology

## 2018-04-16 VITALS — BP 132/60 | HR 109 | Ht 69.0 in | Wt 176.0 lb

## 2018-04-16 DIAGNOSIS — I10 Essential (primary) hypertension: Secondary | ICD-10-CM | POA: Diagnosis not present

## 2018-04-16 DIAGNOSIS — I4819 Other persistent atrial fibrillation: Secondary | ICD-10-CM

## 2018-04-16 DIAGNOSIS — I481 Persistent atrial fibrillation: Secondary | ICD-10-CM

## 2018-04-16 MED ORDER — METOPROLOL SUCCINATE ER 50 MG PO TB24: 50.0000 mg | ORAL_TABLET | Freq: Every day | ORAL | 3 refills | Status: DC

## 2018-04-16 NOTE — Progress Notes (Signed)
Clinical Summary Ms. Canario is a 77 y.o.female previously seen by Dr Allyson Sabal, this is our first visit together.  1. 1. Afib - recent diagnosis, has been asymptomatic - on Toprol for rate control, started on eliquis (?03/12/18?) - 03/2018 echo LVEF 55-60%, no WMAs, mild AI, mild MR, mod LAE, PASP 40 - since starting eliquis had some vaginal spotting. Now resolved.   - no recent palpitations   2. Elevated bp - Toprol increased last visit with Dr Allyson Sabal, started on losartan - losartan later increased to 50mg  daily.   PMH 1. Afib 2. HTN   No Known Allergies   Current Outpatient Medications  Medication Sig Dispense Refill  . apixaban (ELIQUIS) 5 MG TABS tablet Take 1 tablet (5 mg total) by mouth 2 (two) times daily. 60 tablet 6  . buPROPion (WELLBUTRIN) 100 MG tablet Take 100 mg by mouth 3 (three) times daily.  6  . clonazePAM (KLONOPIN) 0.5 MG tablet Take 0.5 mg by mouth 2 (two) times daily.  0  . DULoxetine (CYMBALTA) 30 MG capsule Take by mouth daily.  3  . losartan (COZAAR) 25 MG tablet Take 2 tablets (50 mg total) by mouth daily. 90 tablet 3  . metoprolol succinate (TOPROL-XL) 100 MG 24 hr tablet Take 1 tablet (100 mg total) by mouth daily. 90 tablet 3  . QUEtiapine (SEROQUEL) 50 MG tablet Take 50 mg by mouth daily.  5   No current facility-administered medications for this visit.      Past Surgical History:  Procedure Laterality Date  . detatched retina       No Known Allergies    No family history on file.   Social History Ms. Brilla reports that she has never smoked. She has never used smokeless tobacco. Ms. Chevis reports that she does not drink alcohol.   Review of Systems CONSTITUTIONAL: No weight loss, fever, chills, weakness or fatigue.  HEENT: Eyes: No visual loss, blurred vision, double vision or yellow sclerae.No hearing loss, sneezing, congestion, runny nose or sore throat.  SKIN: No rash or itching.  CARDIOVASCULAR: per  hpi RESPIRATORY: No shortness of breath, cough or sputum.  GASTROINTESTINAL: No anorexia, nausea, vomiting or diarrhea. No abdominal pain or blood.  GENITOURINARY: No burning on urination, no polyuria NEUROLOGICAL: No headache, dizziness, syncope, paralysis, ataxia, numbness or tingling in the extremities. No change in bowel or bladder control.  MUSCULOSKELETAL: No muscle, back pain, joint pain or stiffness.  LYMPHATICS: No enlarged nodes. No history of splenectomy.  PSYCHIATRIC: No history of depression or anxiety.  ENDOCRINOLOGIC: No reports of sweating, cold or heat intolerance. No polyuria or polydipsia.  Marland Kitchen   Physical Examination Vitals:   04/16/18 0922  BP: 132/60  Pulse: (!) 109  SpO2: 98%   Vitals:   04/16/18 0922  Weight: 176 lb (79.8 kg)  Height: 5\' 9"  (1.753 m)    Gen: resting comfortably, no acute distress HEENT: no scleral icterus, pupils equal round and reactive, no palptable cervical adenopathy,  CV: irreg, rate 110, no m/r/g, no jvd Resp: Clear to auscultation bilaterally GI: abdomen is soft, non-tender, non-distended, normal bowel sounds, no hepatosplenomegaly MSK: extremities are warm, no edema.  Skin: warm, no rash Neuro:  no focal deficits Psych: appropriate affect   Diagnostic Studies  03/2018 echo Study Conclusions  - Left ventricle: The cavity size was normal. There was mild focal   basal hypertrophy of the septum. Systolic function was normal.   The estimated ejection fraction was in the  range of 55% to 60%.   Wall motion was normal; there were no regional wall motion   abnormalities. - Aortic valve: There was mild regurgitation. - Mitral valve: Calcified annulus. There was mild regurgitation. - Left atrium: The atrium was moderately dilated. - Pulmonary arteries: Systolic pressure was mildly increased. PA   peak pressure: 40 mm Hg (S).  Impressions:  - Normal LV systolic function; calcified aortic valve with mild AI;   mild MR; moderate  LAE; mild TR with mild pulmonary hypertension.   Assessment and Plan  1. Persistent afib - asymptomatic. EKG today shows afib rates elevated - increaset Toprol to 150mg  daily. Continue eliquis - discussed possible DCCV in setting of new diagnosis of persistent afib, at this time she is hesistant and wishes to continue medical therapy - nursing visti 1 week with EKG check, further toprol titration if needed -check BMET/Mg/TSH  2. HTN - at goal, f/u BMET since increase in losartan   F/u 6 weeks      Antoine Poche, M.D., F.A.C.C.

## 2018-04-16 NOTE — Patient Instructions (Signed)
Medication Instructions:  Increase Toprol xl to 150 mg daily   Labwork: TSH MAGNESIUM BMET  Testing/Procedure: NONE  Follow-Up: Your physician recommends that you schedule a follow-up appointment in: 1 WEEK EKG NURSE VISIT Your physician recommends that you schedule a follow-up appointment in: 6 WEEKS WITH DR. BRANCH     Any Other Special Instructions Will Be Listed Below (If Applicable).     If you need a refill on your cardiac medications before your next appointment, please call your pharmacy.

## 2018-04-23 ENCOUNTER — Ambulatory Visit (INDEPENDENT_AMBULATORY_CARE_PROVIDER_SITE_OTHER): Payer: Medicare HMO

## 2018-04-23 VITALS — BP 138/84 | HR 93 | Ht 71.0 in | Wt 178.0 lb

## 2018-04-23 DIAGNOSIS — I4891 Unspecified atrial fibrillation: Secondary | ICD-10-CM

## 2018-04-23 DIAGNOSIS — I482 Chronic atrial fibrillation, unspecified: Secondary | ICD-10-CM

## 2018-04-23 MED ORDER — METOPROLOL SUCCINATE ER 100 MG PO TB24: 200.0000 mg | ORAL_TABLET | Freq: Every day | ORAL | 3 refills | Status: DC

## 2018-04-23 NOTE — Progress Notes (Signed)
EKG per Dr Wyline MoodBranch

## 2018-04-23 NOTE — Patient Instructions (Addendum)
INCREASE Toprol XL to 200 mg daily     Return in 2 weeks for EKG/nurse visit      Thank you for choosing [facility] !

## 2018-04-27 ENCOUNTER — Other Ambulatory Visit (HOSPITAL_COMMUNITY)
Admission: RE | Admit: 2018-04-27 | Discharge: 2018-04-27 | Disposition: A | Discharge status: Home / Self Care | Payer: Medicare HMO | Source: Ambulatory Visit | Attending: Cardiology | Admitting: Cardiology

## 2018-04-27 DIAGNOSIS — I482 Chronic atrial fibrillation: Secondary | ICD-10-CM | POA: Insufficient documentation

## 2018-04-27 LAB — TSH: TSH: 5.032 u[IU]/mL — AB (ref 0.350–4.500)

## 2018-04-27 LAB — BASIC METABOLIC PANEL
ANION GAP: 7 (ref 5–15)
BUN: 11 mg/dL (ref 8–23)
CALCIUM: 9.5 mg/dL (ref 8.9–10.3)
CO2: 30 mmol/L (ref 22–32)
Chloride: 104 mmol/L (ref 98–111)
Creatinine, Ser: 1.03 mg/dL — ABNORMAL HIGH (ref 0.44–1.00)
GFR, EST AFRICAN AMERICAN: 60 mL/min — AB (ref >60)
GFR, EST NON AFRICAN AMERICAN: 51 mL/min — AB (ref >60)
GLUCOSE: 125 mg/dL — AB (ref 70–99)
POTASSIUM: 4.8 mmol/L (ref 3.5–5.1)
SODIUM: 141 mmol/L (ref 135–145)

## 2018-04-27 LAB — MAGNESIUM: MAGNESIUM: 2.2 mg/dL (ref 1.7–2.4)

## 2018-04-28 ENCOUNTER — Telehealth: Payer: Self-pay

## 2018-04-28 DIAGNOSIS — R002 Palpitations: Secondary | ICD-10-CM

## 2018-04-28 NOTE — Telephone Encounter (Signed)
Mailed lab slips

## 2018-04-28 NOTE — Telephone Encounter (Signed)
-----   Message from Antoine PocheJonathan F Branch, MD sent at 04/28/2018  3:24 PM EDT ----- Labs show thyroid function may be a little low, can we order T3 and a free T4 labs for her   Dominga FerryJ Branch MD

## 2018-04-28 NOTE — Telephone Encounter (Signed)
-----   Message from Jonathan F Branch, MD sent at 04/28/2018  3:24 PM EDT ----- Labs show thyroid function may be a little low, can we order T3 and a free T4 labs for her   J Branch MD 

## 2018-04-28 NOTE — Telephone Encounter (Signed)
Called pt. No answer- left message for pt to return call. 

## 2018-04-29 ENCOUNTER — Telehealth: Payer: Self-pay | Admitting: Cardiology

## 2018-04-29 NOTE — Telephone Encounter (Signed)
Pt's daughter Lauren Baker called stating pt had a little bit of a nose bleed this morning when she got up and wanted to speak w/ the nurse since her mom is on apixaban (ELIQUIS) 5 MG TABS tablet [[id number]][[id number]]   Lauren Baker can be reached @ [phone]

## 2018-04-29 NOTE — Telephone Encounter (Signed)
Made pt's daughter aware to watch for recurrent nose bleeds. She voiced understanding and will notify if happens again.

## 2018-04-29 NOTE — Telephone Encounter (Signed)
Would monitor at this time, if significant recurrent bleeding we may have to hold the eliquis. She can try some over the counter saline nasal drops, this may help some   J Nihar Klus MD

## 2018-04-29 NOTE — Telephone Encounter (Signed)
Returned pt's daughter call. She sates that her mother called her this morning to inform her that her nose had bled at some point through the night. It was very little blood and t had stopped by the time she woke up. She is concerned that it may be the eliquis. This is the only time she has encountered any bleeding since starting the eliquis. She also asked if it could be the weather changing and maybe it's a little dry. She is asking if saline nose drops/spray could possibly help if in deed it is the weather- allergy related. Please advise.

## 2018-05-06 ENCOUNTER — Ambulatory Visit: Payer: Medicare HMO

## 2018-05-07 ENCOUNTER — Ambulatory Visit (INDEPENDENT_AMBULATORY_CARE_PROVIDER_SITE_OTHER): Payer: Medicare HMO | Admitting: *Deleted

## 2018-05-07 ENCOUNTER — Other Ambulatory Visit (HOSPITAL_COMMUNITY)
Admission: RE | Admit: 2018-05-07 | Discharge: 2018-05-07 | Disposition: A | Discharge status: Home / Self Care | Payer: Medicare HMO | Source: Ambulatory Visit | Attending: Cardiology | Admitting: Cardiology

## 2018-05-07 VITALS — BP 150/80 | HR 139 | Ht 71.0 in | Wt 179.4 lb

## 2018-05-07 DIAGNOSIS — I10 Essential (primary) hypertension: Secondary | ICD-10-CM

## 2018-05-07 DIAGNOSIS — R002 Palpitations: Secondary | ICD-10-CM | POA: Diagnosis not present

## 2018-05-07 DIAGNOSIS — I482 Chronic atrial fibrillation, unspecified: Secondary | ICD-10-CM

## 2018-05-07 LAB — T4, FREE: Free T4: 0.83 ng/dL (ref 0.82–1.77)

## 2018-05-07 MED ORDER — DILTIAZEM HCL 60 MG PO TABS: 60.0000 mg | ORAL_TABLET | Freq: Two times a day (BID) | ORAL | 11 refills | Status: DC

## 2018-05-07 MED ORDER — LOSARTAN POTASSIUM 25 MG PO TABS: 50.0000 mg | ORAL_TABLET | Freq: Every day | ORAL | 3 refills | Status: DC

## 2018-05-07 NOTE — Progress Notes (Signed)
Spoke with Dr. Wyline Mood. Verbal order given for Diltiazem 60 mg Two times daily. EKG and vitals in 1 week.

## 2018-05-07 NOTE — Patient Instructions (Signed)
Medication Instructions:  Your physician has recommended you make the following change in your medication:  Start Diltiazem 60 mg Two times Daily    Labwork: NONE   Testing/Procedures: NONE   Follow-Up: Your physician recommends that you schedule a follow-up appointment in one week for EKG and Blood Pressure check.   Any Other Special Instructions Will Be Listed Below (If Applicable).     If you need a refill on your cardiac medications before your next appointment, please call your pharmacy.  Thank you for choosing [facility]!

## 2018-05-08 LAB — T3: T3, Total: 102 ng/dL (ref 71–180)

## 2018-05-10 ENCOUNTER — Ambulatory Visit: Payer: Medicare HMO

## 2018-05-14 ENCOUNTER — Ambulatory Visit (INDEPENDENT_AMBULATORY_CARE_PROVIDER_SITE_OTHER): Payer: Medicare HMO

## 2018-05-14 VITALS — BP 134/80 | HR 84 | Ht 69.0 in | Wt 181.0 lb

## 2018-05-14 DIAGNOSIS — I482 Chronic atrial fibrillation, unspecified: Secondary | ICD-10-CM | POA: Diagnosis not present

## 2018-05-14 DIAGNOSIS — I4891 Unspecified atrial fibrillation: Secondary | ICD-10-CM

## 2018-05-14 NOTE — Progress Notes (Signed)
Pt came in for a nurse visit per Dr. Wyline Mood. She states she is feeling better. She said she has not had any problems with her increased dose of diltiazem.

## 2018-05-17 NOTE — Progress Notes (Signed)
Heart rate and blood pressures look good, no changes. We will reevaluate at our follow up later this month   Dominga Ferry MD

## 2018-05-28 ENCOUNTER — Ambulatory Visit (INDEPENDENT_AMBULATORY_CARE_PROVIDER_SITE_OTHER): Payer: 59 | Admitting: Cardiology

## 2018-05-28 ENCOUNTER — Ambulatory Visit (INDEPENDENT_AMBULATORY_CARE_PROVIDER_SITE_OTHER): Payer: 59

## 2018-05-28 ENCOUNTER — Encounter: Payer: Self-pay | Admitting: Cardiology

## 2018-05-28 VITALS — BP 156/102 | HR 120 | Ht 70.0 in | Wt 184.0 lb

## 2018-05-28 DIAGNOSIS — I10 Essential (primary) hypertension: Secondary | ICD-10-CM | POA: Diagnosis not present

## 2018-05-28 DIAGNOSIS — I4819 Other persistent atrial fibrillation: Secondary | ICD-10-CM

## 2018-05-28 DIAGNOSIS — R002 Palpitations: Secondary | ICD-10-CM | POA: Diagnosis not present

## 2018-05-28 DIAGNOSIS — R42 Dizziness and giddiness: Secondary | ICD-10-CM

## 2018-05-28 MED ORDER — DILTIAZEM HCL 60 MG PO TABS: 60.0000 mg | ORAL_TABLET | Freq: Three times a day (TID) | ORAL | 11 refills | Status: DC

## 2018-05-28 NOTE — Progress Notes (Signed)
Clinical Summary Ms. Gangi is a 77 y.o.female seen today for follow up of the following medical problems.   1. 1. Afib - recent diagnosis, has been asymptomatic - on Toprol for rate control, started on eliquis (?03/12/18?) - 03/2018 echo LVEF 55-60%, no WMAs, mild AI, mild MR, mod LAE, PASP 40 - since starting eliquis had some vaginal spotting. Now resolved.   - has had recent elevated rates, we have been titrating up her medications. Toprol up to 200mg  daily, started dilt on 60mg  bid. At nursing follow up 10/4 heart rates looked good.    - severe episodes of dizziness yesterday, primarily with standing. Checked her vitals with bps 110s/60s, reported HR's 40s to 50s by bp cuff, unclear accuracy. Symtoms have resolved today. - no significant palpitations.    2. Orthostatic dizziness - episodes yesterday of dizziness with standing.  - glasses of water x 1, mountain dew x 4 or more, 1 cup of coffee.     PMH 1. Afib 2. HTN No past medical history on file.   No Known Allergies   Current Outpatient Medications  Medication Sig Dispense Refill  . apixaban (ELIQUIS) 5 MG TABS tablet Take 1 tablet (5 mg total) by mouth 2 (two) times daily. 60 tablet 6  . buPROPion (WELLBUTRIN) 100 MG tablet Take 100 mg by mouth 3 (three) times daily.  6  . clonazePAM (KLONOPIN) 0.5 MG tablet Take 0.5 mg by mouth 2 (two) times daily.  0  . diltiazem (CARDIZEM) 60 MG tablet Take 1 tablet (60 mg total) by mouth 2 (two) times daily. 60 tablet 11  . DULoxetine (CYMBALTA) 30 MG capsule Take by mouth daily.  3  . losartan (COZAAR) 25 MG tablet Take 2 tablets (50 mg total) by mouth daily. 90 tablet 3  . metoprolol succinate (TOPROL-XL) 100 MG 24 hr tablet Take 2 tablets (200 mg total) by mouth daily. 180 tablet 3  . QUEtiapine (SEROQUEL) 50 MG tablet Take 50 mg by mouth daily.  5   No current facility-administered medications for this visit.      Past Surgical History:  Procedure Laterality  Date  . detatched retina       No Known Allergies    No family history on file.   Social History Ms. Kram reports that she has never smoked. She has never used smokeless tobacco. Ms. Smoot reports that she does not drink alcohol.   Review of Systems CONSTITUTIONAL: No weight loss, fever, chills, weakness or fatigue.  HEENT: Eyes: No visual loss, blurred vision, double vision or yellow sclerae.No hearing loss, sneezing, congestion, runny nose or sore throat.  SKIN: No rash or itching.  CARDIOVASCULAR: per hpi RESPIRATORY: No shortness of breath, cough or sputum.  GASTROINTESTINAL: No anorexia, nausea, vomiting or diarrhea. No abdominal pain or blood.  GENITOURINARY: No burning on urination, no polyuria NEUROLOGICAL: No headache, dizziness, syncope, paralysis, ataxia, numbness or tingling in the extremities. No change in bowel or bladder control.  MUSCULOSKELETAL: No muscle, back pain, joint pain or stiffness.  LYMPHATICS: No enlarged nodes. No history of splenectomy.  PSYCHIATRIC: No history of depression or anxiety.  ENDOCRINOLOGIC: No reports of sweating, cold or heat intolerance. No polyuria or polydipsia.  Marland Kitchen   Physical Examination Vitals:   05/28/18 1336  BP: (!) 156/102  Pulse: (!) 120  SpO2: 98%   Vitals:   05/28/18 1336  Weight: 184 lb (83.5 kg)  Height: 5\' 10"  (1.778 m)    Gen: resting comfortably,  no acute distress HEENT: no scleral icterus, pupils equal round and reactive, no palptable cervical adenopathy,  CV: irreg, no m/r/g, no jvd Resp: Clear to auscultation bilaterally GI: abdomen is soft, non-tender, non-distended, normal bowel sounds, no hepatosplenomegaly MSK: extremities are warm, no edema.  Skin: warm, no rash Neuro:  no focal deficits Psych: appropriate affect   Diagnostic Studies 03/2018 echo Study Conclusions  - Left ventricle: The cavity size was normal. There was mild focal basal hypertrophy of the septum. Systolic  function was normal. The estimated ejection fraction was in the range of 55% to 60%. Wall motion was normal; there were no regional wall motion abnormalities. - Aortic valve: There was mild regurgitation. - Mitral valve: Calcified annulus. There was mild regurgitation. - Left atrium: The atrium was moderately dilated. - Pulmonary arteries: Systolic pressure was mildly increased. PA peak pressure: 40 mm Hg (S).  Impressions:  - Normal LV systolic function; calcified aortic valve with mild AI; mild MR; moderate LAE; mild TR with mild pulmonary hypertension.    Assessment and Plan  1. Persistent afib - rates are up and down, EKG today shows they are elevated again today to 120s - we will change her dilt to 60mg  tid, if controls can consolidate to long acting - severe dizziness yesterday, reported low heart rates by her home bp cuff but unclear accuracy particulary in the setting of afib. We will plan for a 21 day event monitor to follow heart rates, to evaluate for any potential tachy-brady syndrome that may have contributed to her symptoms. Titrate av nodal agents accordingly -she remains resistant for cardioversion  2. Dizziness - symptoms were just yesterday but severe, now resolved. Primarily with standing, but her home bp cuff reported HR's in the 40s - she is orthostatic in clinic, overall poor oral hydration. Encouraged better hydration - unclear if bradycardia was real or played any role, raises some suspicion for possible tachy-brady syndrome. Will plan for event monitor.   3. HTN - elevated in clinic, follow with increased dilt dosing.      Antoine Poche, M.D.

## 2018-05-28 NOTE — Patient Instructions (Signed)
Medication Instructions:  Increase DILTIAZEM TO 60 MG- THREE TIMES DAILY   Labwork: NONE  Testing/Procedures: Your physician has recommended that you wear an event monitor. Event monitors are medical devices that record the heart's electrical activity. Doctors most often [place] these monitors to diagnose arrhythmias. Arrhythmias are problems with the speed or rhythm of the heartbeat. The monitor is a small, portable device. You can wear one while you do your normal daily activities. This is usually used to diagnose what is causing palpitations/syncope (passing out).    Follow-Up: Your physician recommends that you schedule a follow-up appointment in: 1 MONTH    Any Other Special Instructions Will Be Listed Below (If Applicable).     If you need a refill on your cardiac medications before your next appointment, please call your pharmacy.

## 2018-06-23 ENCOUNTER — Telehealth: Payer: Self-pay

## 2018-06-23 NOTE — Telephone Encounter (Signed)
Called pt. No answer, left message for pt to return call.  

## 2018-06-23 NOTE — Telephone Encounter (Signed)
-----   Message from Jonathan F Branch, MD sent at 06/23/2018  3:45 PM EST ----- Heart monitor shows heart rates remain elevated, no significant slow heart rates. Please increase dilt to 90mg tid   J BrancH MD 

## 2018-06-24 ENCOUNTER — Telehealth: Payer: Self-pay

## 2018-06-24 MED ORDER — DILTIAZEM HCL 90 MG PO TABS: 90.0000 mg | ORAL_TABLET | Freq: Three times a day (TID) | ORAL | 3 refills | Status: DC

## 2018-06-24 NOTE — Telephone Encounter (Signed)
Daughter will increase mothers diltiazem to 90 mg TID, e-scribed pharmacy

## 2018-06-24 NOTE — Telephone Encounter (Signed)
-----   Message from Antoine PocheJonathan F Branch, MD sent at 06/23/2018  3:45 PM EST ----- Heart monitor shows heart rates remain elevated, no significant slow heart rates. Please increase dilt to 90mg  tid   J BrancH MD

## 2018-07-02 ENCOUNTER — Ambulatory Visit (INDEPENDENT_AMBULATORY_CARE_PROVIDER_SITE_OTHER): Payer: Medicare HMO | Admitting: Student

## 2018-07-02 ENCOUNTER — Encounter: Payer: Self-pay | Admitting: Student

## 2018-07-02 VITALS — BP 124/73 | HR 67 | Ht 70.0 in | Wt 188.0 lb

## 2018-07-02 DIAGNOSIS — I4819 Other persistent atrial fibrillation: Secondary | ICD-10-CM

## 2018-07-02 DIAGNOSIS — Z7901 Long term (current) use of anticoagulants: Secondary | ICD-10-CM

## 2018-07-02 DIAGNOSIS — I10 Essential (primary) hypertension: Secondary | ICD-10-CM

## 2018-07-02 MED ORDER — DILTIAZEM HCL ER COATED BEADS 300 MG PO CP24: 300.0000 mg | ORAL_CAPSULE | Freq: Every day | ORAL | 3 refills | Status: DC

## 2018-07-02 NOTE — Progress Notes (Signed)
Cardiology Office Note    Date:  07/03/2018   ID:  Jaslen, Adcox Oct 12, 1940, MRN [id number]  PCP:  Elfredia Nevins, MD  Cardiologist: Dina Rich, MD    Chief Complaint  Patient presents with  . Follow-up    4 week visit    History of Present Illness:    Lauren Baker is a 77 y.o. female with past medical history of permanent atrial fibrillation (on Eliquis), HTN, and orthostatic dizziness who presents to the office today for 4-week follow-up.   She was last examined by Dr. Wyline Mood in 05/2018 and reported intermittent episodes of dizziness, mostly occurring with standing. BP was 156/102 at the time of her visit but HR was elevated into the 120's. Cardizem was further titrated to 60mg  TID with plans to consolidate to long-acting Cardizem CD pending her response. Was continued on Toprol-XL 200mg  daily and wished to hold off on DCCV if possible. A 21-day event monitor was recommended for further assessment and showed atrial fibrillation with variable rates, peaking into the 150's and a 5 beat run of NSVT. Cardizem was further titrated to 90mg  TID when her monitor resulted.   In talking with the patient today, she reports overall doing well from a cardiac perspective since her last office visit.  She denies any recent chest pain, dyspnea on exertion, palpitations, orthopnea, PND, or lower extremity edema. She has overall been asymptomatic with her arrhythmia but does report she was having a variable heart rate when this was checked at home. Says that her HR has normalized since Diltiazem was further titrated to 90 mg TID.  She reports good compliance with Eliquis and denies missing any recent doses. No recent melena, hematochezia, or hematuria. Does experience easy bruising.   Past Medical History:  Diagnosis Date  . Chronic atrial fibrillation   . Hypertension     Past Surgical History:  Procedure Laterality Date  . detatched retina      Current  Medications: Outpatient Medications Prior to Visit  Medication Sig Dispense Refill  . apixaban (ELIQUIS) 5 MG TABS tablet Take 1 tablet (5 mg total) by mouth 2 (two) times daily. 60 tablet 6  . buPROPion (WELLBUTRIN) 100 MG tablet Take 100 mg by mouth 3 (three) times daily.  6  . cholecalciferol (VITAMIN D) 1000 units tablet Take 1,000 Units by mouth daily.    . clonazePAM (KLONOPIN) 0.5 MG tablet Take 0.5 mg by mouth 2 (two) times daily.  0  . DULoxetine (CYMBALTA) 30 MG capsule Take by mouth daily.  3  . losartan (COZAAR) 25 MG tablet Take 2 tablets (50 mg total) by mouth daily. 90 tablet 3  . metoprolol succinate (TOPROL-XL) 100 MG 24 hr tablet Take 2 tablets (200 mg total) by mouth daily. 180 tablet 3  . QUEtiapine (SEROQUEL) 50 MG tablet Take 50 mg by mouth daily.  5  . diltiazem (CARDIZEM) 90 MG tablet Take 1 tablet (90 mg total) by mouth 3 (three) times daily. 90 tablet 3   No facility-administered medications prior to visit.      Allergies:   Patient has no known allergies.   Social History   Socioeconomic History  . Marital status: Married    Spouse name: Not on file  . Number of children: Not on file  . Years of education: Not on file  . Highest education level: Not on file  Occupational History  . Not on file  Social Needs  . Financial resource strain: Not on  file  . Food insecurity:    Worry: Not on file    Inability: Not on file  . Transportation needs:    Medical: Not on file    Non-medical: Not on file  Tobacco Use  . Smoking status: Never Smoker  . Smokeless tobacco: Never Used  Substance and Sexual Activity  . Alcohol use: No  . Drug use: No  . Sexual activity: Not on file  Lifestyle  . Physical activity:    Days per week: Not on file    Minutes per session: Not on file  . Stress: Not on file  Relationships  . Social connections:    Talks on phone: Not on file    Gets together: Not on file    Attends religious service: Not on file    Active member  of club or organization: Not on file    Attends meetings of clubs or organizations: Not on file    Relationship status: Not on file  Other Topics Concern  . Not on file  Social History Narrative  . Not on file     Family History:  The patient's family history includes CVA in her mother; Cancer in her sister; Heart attack in her brother and sister; Hypertension in her sister.   Review of Systems:   Please see the history of present illness.     General:  No chills, fever, night sweats or weight changes.  Cardiovascular:  No chest pain, dyspnea on exertion, edema, orthopnea, palpitations, paroxysmal nocturnal dyspnea. Dermatological: No rash, lesions/masses. Positive for easy bruising.  Respiratory: No cough, dyspnea Urologic: No hematuria, dysuria Abdominal:   No nausea, vomiting, diarrhea, bright red blood per rectum, melena, or hematemesis Neurologic:  No visual changes, wkns, changes in mental status. All other systems reviewed and are otherwise negative except as noted above.   Physical Exam:    VS:  BP 124/73   Pulse 67   Ht 5\' 10"  (1.778 m)   Wt 188 lb (85.3 kg)   SpO2 95%   BMI 26.98 kg/m    General: Well developed, well nourished Caucasian female appearing in no acute distress. Head: Normocephalic, atraumatic, sclera non-icteric, no xanthomas, nares are without discharge.  Neck: No carotid bruits. JVD not elevated.  Lungs: Respirations regular and unlabored, without wheezes or rales.  Heart: Irregularly irregular. No S3 or S4.  No murmur, no rubs, or gallops appreciated. Abdomen: Soft, non-tender, non-distended with normoactive bowel sounds. No hepatomegaly. No rebound/guarding. No obvious abdominal masses. Msk:  Strength and tone appear normal for age. No joint deformities or effusions. Extremities: No clubbing or cyanosis. No lower extremity edema.  Distal pedal pulses are 2+ bilaterally. Neuro: Alert and oriented X 3. Moves all extremities spontaneously. No focal  deficits noted. Psych:  Responds to questions appropriately with a normal affect. Skin: No rashes or lesions noted  Wt Readings from Last 3 Encounters:  07/02/18 188 lb (85.3 kg)  05/28/18 184 lb (83.5 kg)  05/14/18 181 lb (82.1 kg)     Studies/Labs Reviewed:   EKG:  EKG is not ordered today.   Recent Labs: 04/27/2018: BUN 11; Creatinine, Ser 1.03; Magnesium 2.2; Potassium 4.8; Sodium 141; TSH 5.032   Lipid Panel No results found for: CHOL, TRIG, HDL, CHOLHDL, VLDL, LDLCALC, LDLDIRECT  Additional studies/ records that were reviewed today include:   Echocardiogram: 03/2018 Study Conclusions  - Left ventricle: The cavity size was normal. There was mild focal   basal hypertrophy of the septum. Systolic function  was normal.   The estimated ejection fraction was in the range of 55% to 60%.   Wall motion was normal; there were no regional wall motion   abnormalities. - Aortic valve: There was mild regurgitation. - Mitral valve: Calcified annulus. There was mild regurgitation. - Left atrium: The atrium was moderately dilated. - Pulmonary arteries: Systolic pressure was mildly increased. PA   peak pressure: 40 mm Hg (S).  Impressions:  - Normal LV systolic function; calcified aortic valve with mild AI;   mild MR; moderate LAE; mild TR with mild pulmonary hypertension.  Event Monitor: 05/2018  21 day event monitor  No symptoms reported  Telemetry tracings show afib with variable rates. Some episodes of RVR highest rate up to 150 bpm.  5 beat run of NSVT  Assessment:    1. Persistent atrial fibrillation   2. Current use of long term anticoagulation   3. Essential hypertension      Plan:   In order of problems listed above:  1. Persistent Atrial Fibrillation/ Use of Long-Term Anticoagulation - she denies any recent palpitations or dyspnea, reporting overall being asymptomatic with her arrhythmia. HR is well-controlled in the 60's during today's visit. Will  continue Toprol-XL 200mg  daily and consolidate Cardizem 90mg  TID to Cardizem CD 300mg  daily.  - she denies any evidence of active bleeding. Continue Eliquis 5mg  BID for anticoagulation.   2. HTN - BP is well-controlled at 124/73 during today's visit. - continue Toprol-XL 200mg  daily and Losartan 50mg  daily with consolidation of short-acting Cardizem to Cardizem CD as outlined above.    Medication Adjustments/Labs and Tests Ordered: Current medicines are reviewed at length with the patient today.  Concerns regarding medicines are outlined above.  Medication changes, Labs and Tests ordered today are listed in the Patient Instructions below. Patient Instructions  Medication Instructions:  Your physician has recommended you make the following change in your medication:  Cardizem CD 300 mg Daily   If you need a refill on your cardiac medications before your next appointment, please call your pharmacy.   Lab work: NONE  If you have labs (blood work) drawn today and your tests are completely normal, you will receive your results only by: Marland Kitchen MyChart Message (if you have MyChart) OR . A paper copy in the mail If you have any lab test that is abnormal or we need to change your treatment, we will call you to review the results.  Testing/Procedures: NONE   Follow-Up: At [facility], you and your health needs are our priority.  As part of our continuing mission to provide you with exceptional heart care, we have created designated Provider Care Teams.  These Care Teams include your primary Cardiologist (physician) and Advanced Practice Providers (APPs -  Physician Assistants and Nurse Practitioners) who all work together to provide you with the care you need, when you need it. You will need a follow up appointment in 3 months.  Please call our office 2 months in advance to schedule this appointment.  You may see Dina Rich, MD or one of the following Advanced Practice Providers on your  designated Care Team:   Randall An, PA-C [facility]) . Jacolyn Reedy, PA-C [facility] Office)  Any Other Special Instructions Will Be Listed Below (If Applicable). Thank you for choosing [facility]!     Signed, Ellsworth Lennox, PA-C  07/03/2018 1:54 PM    [facility] [address] [place], [place] [address] Phone: [phone]

## 2018-07-02 NOTE — Patient Instructions (Signed)
Medication Instructions:  Your physician has recommended you make the following change in your medication:  Cardizem CD 300 mg Daily   If you need a refill on your cardiac medications before your next appointment, please call your pharmacy.   Lab work: NONE  If you have labs (blood work) drawn today and your tests are completely normal, you will receive your results only by: Marland Kitchen. MyChart Message (if you have MyChart) OR . A paper copy in the mail If you have any lab test that is abnormal or we need to change your treatment, we will call you to review the results.  Testing/Procedures: NONE   Follow-Up: At [facility], you and your health needs are our priority.  As part of our continuing mission to provide you with exceptional heart care, we have created designated Provider Care Teams.  These Care Teams include your primary Cardiologist (physician) and Advanced Practice Providers (APPs -  Physician Assistants and Nurse Practitioners) who all work together to provide you with the care you need, when you need it. You will need a follow up appointment in 3 months.  Please call our office 2 months in advance to schedule this appointment.  You may see Dina RichBranch, Jonathan, MD or one of the following Advanced Practice Providers on your designated Care Team:   Randall AnBrittany Strader, PA-C [facility] [facility]) . Jacolyn ReedyMichele Lenze, PA-C [facility] [facility])  Any Other Special Instructions Will Be Listed Below (If Applicable). Thank you for choosing [facility]!

## 2018-07-03 ENCOUNTER — Encounter: Payer: Self-pay | Admitting: Student

## 2018-07-21 ENCOUNTER — Telehealth: Payer: Self-pay | Admitting: Cardiology

## 2018-07-21 DIAGNOSIS — I482 Chronic atrial fibrillation, unspecified: Secondary | ICD-10-CM

## 2018-07-21 MED ORDER — METOPROLOL SUCCINATE ER 100 MG PO TB24: 200.0000 mg | ORAL_TABLET | Freq: Every day | ORAL | 6 refills | Status: DC

## 2018-07-21 NOTE — Telephone Encounter (Signed)
Refill complete 

## 2018-07-21 NOTE — Telephone Encounter (Signed)
Patient needs RX for Metoprolol sent to [facility] for  New dosage/tg

## 2018-08-05 ENCOUNTER — Other Ambulatory Visit: Payer: Self-pay | Admitting: Cardiology

## 2018-08-05 DIAGNOSIS — I10 Essential (primary) hypertension: Secondary | ICD-10-CM

## 2018-08-05 MED ORDER — LOSARTAN POTASSIUM 50 MG PO TABS: 50.0000 mg | ORAL_TABLET | Freq: Every day | ORAL | 3 refills | Status: DC

## 2018-08-05 NOTE — Telephone Encounter (Signed)
Needs RX for new dosage of Losartin sent to [facility]. / tg

## 2018-08-05 NOTE — Telephone Encounter (Signed)
refilled losartin 50 mg daily

## 2018-09-20 ENCOUNTER — Other Ambulatory Visit: Payer: Self-pay | Admitting: Cardiology

## 2018-09-20 NOTE — Telephone Encounter (Signed)
Daughter states bottle said losartan 50 mg daily but mother took BID like she was taking 25 mg dose.They have continued to monitor BP and HR daily and have not had any low or high readings. They will f/u in March as planned and will watch BP and HR as before

## 2018-09-20 NOTE — Telephone Encounter (Signed)
Patient's Losartan dosage was changed at last office visit to 25mg  BID. Patient's pharmacy refilled medication with 50mg  tablets without advising patient. Patient has been taking 50mg  BID since 12/26 instead of the dosed 25mg . Now patient needs another refill and pharmacy is stating that insurance will not cover as it is too soon. Patient's daughter is also concerned that she as been taking 100mg  daily instead of the ordered 50mg . Please contact patient's daughter at above listed number./ tg

## 2018-10-13 ENCOUNTER — Other Ambulatory Visit: Payer: Self-pay | Admitting: Cardiovascular Disease

## 2018-10-15 DIAGNOSIS — Z1389 Encounter for screening for other disorder: Secondary | ICD-10-CM | POA: Diagnosis not present

## 2018-10-15 DIAGNOSIS — F419 Anxiety disorder, unspecified: Secondary | ICD-10-CM | POA: Diagnosis not present

## 2018-10-15 DIAGNOSIS — I10 Essential (primary) hypertension: Secondary | ICD-10-CM | POA: Diagnosis not present

## 2018-10-15 DIAGNOSIS — F329 Major depressive disorder, single episode, unspecified: Secondary | ICD-10-CM | POA: Diagnosis not present

## 2018-10-15 DIAGNOSIS — F330 Major depressive disorder, recurrent, mild: Secondary | ICD-10-CM | POA: Diagnosis not present

## 2018-10-15 DIAGNOSIS — E663 Overweight: Secondary | ICD-10-CM | POA: Diagnosis not present

## 2018-10-15 DIAGNOSIS — Z6826 Body mass index (BMI) 26.0-26.9, adult: Secondary | ICD-10-CM | POA: Diagnosis not present

## 2018-10-15 DIAGNOSIS — I4891 Unspecified atrial fibrillation: Secondary | ICD-10-CM | POA: Diagnosis not present

## 2018-10-18 ENCOUNTER — Inpatient Hospital Stay (HOSPITAL_COMMUNITY): Payer: Medicare HMO

## 2018-10-18 ENCOUNTER — Inpatient Hospital Stay (HOSPITAL_COMMUNITY)
Admission: EM | Admit: 2018-10-18 | Discharge: 2018-10-26 | DRG: 907 | Disposition: A | Discharge status: Home / Self Care | Payer: Medicare HMO | Attending: Internal Medicine | Admitting: Internal Medicine

## 2018-10-18 ENCOUNTER — Emergency Department (HOSPITAL_COMMUNITY): Payer: Medicare HMO

## 2018-10-18 ENCOUNTER — Encounter (HOSPITAL_COMMUNITY): Payer: Self-pay

## 2018-10-18 DIAGNOSIS — D649 Anemia, unspecified: Secondary | ICD-10-CM | POA: Diagnosis not present

## 2018-10-18 DIAGNOSIS — I340 Nonrheumatic mitral (valve) insufficiency: Secondary | ICD-10-CM | POA: Diagnosis not present

## 2018-10-18 DIAGNOSIS — Z4682 Encounter for fitting and adjustment of non-vascular catheter: Secondary | ICD-10-CM | POA: Diagnosis not present

## 2018-10-18 DIAGNOSIS — I361 Nonrheumatic tricuspid (valve) insufficiency: Secondary | ICD-10-CM | POA: Diagnosis not present

## 2018-10-18 DIAGNOSIS — R570 Cardiogenic shock: Secondary | ICD-10-CM | POA: Diagnosis not present

## 2018-10-18 DIAGNOSIS — E872 Acidosis, unspecified: Secondary | ICD-10-CM | POA: Diagnosis present

## 2018-10-18 DIAGNOSIS — T447X1A Poisoning by beta-adrenoreceptor antagonists, accidental (unintentional), initial encounter: Principal | ICD-10-CM | POA: Diagnosis present

## 2018-10-18 DIAGNOSIS — R739 Hyperglycemia, unspecified: Secondary | ICD-10-CM | POA: Diagnosis present

## 2018-10-18 DIAGNOSIS — Z7901 Long term (current) use of anticoagulants: Secondary | ICD-10-CM | POA: Diagnosis not present

## 2018-10-18 DIAGNOSIS — N179 Acute kidney failure, unspecified: Secondary | ICD-10-CM | POA: Diagnosis present

## 2018-10-18 DIAGNOSIS — J811 Chronic pulmonary edema: Secondary | ICD-10-CM | POA: Diagnosis not present

## 2018-10-18 DIAGNOSIS — E876 Hypokalemia: Secondary | ICD-10-CM | POA: Diagnosis present

## 2018-10-18 DIAGNOSIS — J9601 Acute respiratory failure with hypoxia: Secondary | ICD-10-CM | POA: Diagnosis not present

## 2018-10-18 DIAGNOSIS — R42 Dizziness and giddiness: Secondary | ICD-10-CM | POA: Diagnosis not present

## 2018-10-18 DIAGNOSIS — R001 Bradycardia, unspecified: Secondary | ICD-10-CM

## 2018-10-18 DIAGNOSIS — I441 Atrioventricular block, second degree: Secondary | ICD-10-CM | POA: Diagnosis not present

## 2018-10-18 DIAGNOSIS — Z8249 Family history of ischemic heart disease and other diseases of the circulatory system: Secondary | ICD-10-CM

## 2018-10-18 DIAGNOSIS — I4821 Permanent atrial fibrillation: Secondary | ICD-10-CM | POA: Diagnosis present

## 2018-10-18 DIAGNOSIS — J9691 Respiratory failure, unspecified with hypoxia: Secondary | ICD-10-CM

## 2018-10-18 DIAGNOSIS — E1165 Type 2 diabetes mellitus with hyperglycemia: Secondary | ICD-10-CM | POA: Diagnosis not present

## 2018-10-18 DIAGNOSIS — W19XXXA Unspecified fall, initial encounter: Secondary | ICD-10-CM

## 2018-10-18 DIAGNOSIS — T83511D Infection and inflammatory reaction due to indwelling urethral catheter, subsequent encounter: Secondary | ICD-10-CM | POA: Diagnosis not present

## 2018-10-18 DIAGNOSIS — Z1639 Resistance to other specified antimicrobial drug: Secondary | ICD-10-CM | POA: Diagnosis present

## 2018-10-18 DIAGNOSIS — I10 Essential (primary) hypertension: Secondary | ICD-10-CM

## 2018-10-18 DIAGNOSIS — T50901A Poisoning by unspecified drugs, medicaments and biological substances, accidental (unintentional), initial encounter: Secondary | ICD-10-CM | POA: Diagnosis not present

## 2018-10-18 DIAGNOSIS — I495 Sick sinus syndrome: Secondary | ICD-10-CM | POA: Diagnosis present

## 2018-10-18 DIAGNOSIS — I5030 Unspecified diastolic (congestive) heart failure: Secondary | ICD-10-CM | POA: Diagnosis not present

## 2018-10-18 DIAGNOSIS — I482 Chronic atrial fibrillation, unspecified: Secondary | ICD-10-CM | POA: Diagnosis present

## 2018-10-18 DIAGNOSIS — R55 Syncope and collapse: Secondary | ICD-10-CM | POA: Diagnosis not present

## 2018-10-18 DIAGNOSIS — I249 Acute ischemic heart disease, unspecified: Secondary | ICD-10-CM | POA: Diagnosis present

## 2018-10-18 DIAGNOSIS — D696 Thrombocytopenia, unspecified: Secondary | ICD-10-CM | POA: Diagnosis not present

## 2018-10-18 DIAGNOSIS — I959 Hypotension, unspecified: Secondary | ICD-10-CM | POA: Diagnosis not present

## 2018-10-18 DIAGNOSIS — J9692 Respiratory failure, unspecified with hypercapnia: Secondary | ICD-10-CM

## 2018-10-18 DIAGNOSIS — B9620 Unspecified Escherichia coli [E. coli] as the cause of diseases classified elsewhere: Secondary | ICD-10-CM | POA: Diagnosis present

## 2018-10-18 DIAGNOSIS — N390 Urinary tract infection, site not specified: Secondary | ICD-10-CM | POA: Diagnosis not present

## 2018-10-18 DIAGNOSIS — Z9911 Dependence on respirator [ventilator] status: Secondary | ICD-10-CM | POA: Diagnosis not present

## 2018-10-18 DIAGNOSIS — J90 Pleural effusion, not elsewhere classified: Secondary | ICD-10-CM | POA: Diagnosis not present

## 2018-10-18 DIAGNOSIS — Z950 Presence of cardiac pacemaker: Secondary | ICD-10-CM | POA: Diagnosis not present

## 2018-10-18 DIAGNOSIS — Z79899 Other long term (current) drug therapy: Secondary | ICD-10-CM | POA: Diagnosis not present

## 2018-10-18 DIAGNOSIS — I509 Heart failure, unspecified: Secondary | ICD-10-CM | POA: Diagnosis not present

## 2018-10-18 DIAGNOSIS — R0689 Other abnormalities of breathing: Secondary | ICD-10-CM | POA: Diagnosis not present

## 2018-10-18 DIAGNOSIS — T83511A Infection and inflammatory reaction due to indwelling urethral catheter, initial encounter: Secondary | ICD-10-CM | POA: Diagnosis not present

## 2018-10-18 DIAGNOSIS — Z959 Presence of cardiac and vascular implant and graft, unspecified: Secondary | ICD-10-CM

## 2018-10-18 DIAGNOSIS — I110 Hypertensive heart disease with heart failure: Secondary | ICD-10-CM | POA: Diagnosis present

## 2018-10-18 DIAGNOSIS — T50901S Poisoning by unspecified drugs, medicaments and biological substances, accidental (unintentional), sequela: Secondary | ICD-10-CM | POA: Diagnosis not present

## 2018-10-18 DIAGNOSIS — R0902 Hypoxemia: Secondary | ICD-10-CM | POA: Diagnosis not present

## 2018-10-18 LAB — COMPREHENSIVE METABOLIC PANEL
ALT: 51 U/L — ABNORMAL HIGH (ref 0–44)
AST: 69 U/L — ABNORMAL HIGH (ref 15–41)
Albumin: 3 g/dL — ABNORMAL LOW (ref 3.5–5.0)
Alkaline Phosphatase: 87 U/L (ref 38–126)
Anion gap: 20 — ABNORMAL HIGH (ref 5–15)
BILIRUBIN TOTAL: 0.9 mg/dL (ref 0.3–1.2)
BUN: 15 mg/dL (ref 8–23)
CO2: 11 mmol/L — ABNORMAL LOW (ref 22–32)
Calcium: 8.9 mg/dL (ref 8.9–10.3)
Chloride: 106 mmol/L (ref 98–111)
Creatinine, Ser: 1.64 mg/dL — ABNORMAL HIGH (ref 0.44–1.00)
GFR calc Af Amer: 35 mL/min — ABNORMAL LOW (ref >60)
GFR calc non Af Amer: 30 mL/min — ABNORMAL LOW (ref >60)
Glucose, Bld: 396 mg/dL — ABNORMAL HIGH (ref 70–99)
Potassium: 4.2 mmol/L (ref 3.5–5.1)
Sodium: 137 mmol/L (ref 135–145)
TOTAL PROTEIN: 6 g/dL — AB (ref 6.5–8.1)

## 2018-10-18 LAB — SALICYLATE LEVEL: Salicylate Lvl: <7 mg/dL (ref 2.8–30.0)

## 2018-10-18 LAB — CBG MONITORING, ED
GLUCOSE-CAPILLARY: 375 mg/dL — AB (ref 70–99)
Glucose-Capillary: 337 mg/dL — ABNORMAL HIGH (ref 70–99)
Glucose-Capillary: 363 mg/dL — ABNORMAL HIGH (ref 70–99)
Glucose-Capillary: 202 mg/dL — ABNORMAL HIGH (ref 70–99)
Glucose-Capillary: 308 mg/dL — ABNORMAL HIGH (ref 70–99)
Glucose-Capillary: 166 mg/dL — ABNORMAL HIGH (ref 70–99)
Glucose-Capillary: 96 mg/dL (ref 70–99)

## 2018-10-18 LAB — CBC
HCT: 42.7 % (ref 36.0–46.0)
Hemoglobin: 13.3 g/dL (ref 12.0–15.0)
MCH: 31.1 pg (ref 26.0–34.0)
MCHC: 31.1 g/dL (ref 30.0–36.0)
MCV: 100 fL (ref 80.0–100.0)
Platelets: 180 10*3/uL (ref 150–400)
RBC: 4.27 MIL/uL (ref 3.87–5.11)
RDW: 13.8 % (ref 11.5–15.5)
WBC: 12.4 10*3/uL — AB (ref 4.0–10.5)
nRBC: 0 % (ref 0.0–0.2)

## 2018-10-18 LAB — POCT I-STAT 7, (LYTES, BLD GAS, ICA,H+H)
Acid-base deficit: 9 mmol/L — ABNORMAL HIGH (ref 0.0–2.0)
BICARBONATE: 18.8 mmol/L — AB (ref 20.0–28.0)
Calcium, Ion: 1.27 mmol/L (ref 1.15–1.40)
HCT: 33 % — ABNORMAL LOW (ref 36.0–46.0)
Hemoglobin: 11.2 g/dL — ABNORMAL LOW (ref 12.0–15.0)
O2 Saturation: 98 %
PO2 ART: 113 mmHg — AB (ref 83.0–108.0)
Patient temperature: 35.2
Potassium: 3 mmol/L — ABNORMAL LOW (ref 3.5–5.1)
Sodium: 143 mmol/L (ref 135–145)
TCO2: 20 mmol/L — ABNORMAL LOW (ref 22–32)
pCO2 arterial: 42.3 mmHg (ref 32.0–48.0)
pH, Arterial: 7.247 — ABNORMAL LOW (ref 7.350–7.450)

## 2018-10-18 LAB — BLOOD GAS, ARTERIAL
Acid-base deficit: 11 mmol/L — ABNORMAL HIGH (ref 0.0–2.0)
Bicarbonate: 15.4 mmol/L — ABNORMAL LOW (ref 20.0–28.0)
FIO2: 36
O2 Saturation: 93.1 %
PH ART: 7.214 — AB (ref 7.350–7.450)
Patient temperature: 37
pCO2 arterial: 39.3 mmHg (ref 32.0–48.0)
pO2, Arterial: 82.8 mmHg — ABNORMAL LOW (ref 83.0–108.0)

## 2018-10-18 LAB — PROTIME-INR
INR: 1.8 — ABNORMAL HIGH (ref 0.8–1.2)
Prothrombin Time: 20.4 seconds — ABNORMAL HIGH (ref 11.4–15.2)

## 2018-10-18 LAB — TROPONIN I

## 2018-10-18 LAB — LACTIC ACID, PLASMA: Lactic Acid, Venous: 7.3 mmol/L (ref 0.5–1.9)

## 2018-10-18 LAB — BETA-HYDROXYBUTYRIC ACID: Beta-Hydroxybutyric Acid: 0.07 mmol/L (ref 0.05–0.27)

## 2018-10-18 LAB — GLUCOSE, CAPILLARY
Glucose-Capillary: 247 mg/dL — ABNORMAL HIGH (ref 70–99)
Glucose-Capillary: 98 mg/dL (ref 70–99)

## 2018-10-18 LAB — ACETAMINOPHEN LEVEL

## 2018-10-18 MED ORDER — PROPOFOL 1000 MG/100ML IV EMUL: 5.0000 ug/kg/min | INTRAVENOUS | Status: DC

## 2018-10-18 MED ORDER — VITAMIN D 25 MCG (1000 UNIT) PO TABS
1000.0000 [IU] | ORAL_TABLET | Freq: Every day | ORAL | Status: DC
  Administered 2018-10-19 – 2018-10-26 (×8): 1000 [IU] via ORAL
  Filled 2018-10-18 (×9): qty 1

## 2018-10-18 MED ORDER — ETOMIDATE 2 MG/ML IV SOLN
INTRAVENOUS | Status: AC | PRN
  Administered 2018-10-18: 20 mg via INTRAVENOUS

## 2018-10-18 MED ORDER — DOPAMINE-DEXTROSE 3.2-5 MG/ML-% IV SOLN
0.0000 ug/kg/min | INTRAVENOUS | Status: DC
  Administered 2018-10-18: 10 ug/kg/min via INTRAVENOUS

## 2018-10-18 MED ORDER — ONDANSETRON HCL 4 MG/2ML IJ SOLN
INTRAMUSCULAR | Status: AC
  Administered 2018-10-18: 4 mg via INTRAVENOUS
  Filled 2018-10-18: qty 2

## 2018-10-18 MED ORDER — EPINEPHRINE PF 1 MG/10ML IJ SOSY
PREFILLED_SYRINGE | INTRAMUSCULAR | Status: AC | PRN
  Administered 2018-10-18: 0.5 mg via INTRAVENOUS

## 2018-10-18 MED ORDER — CALCIUM GLUCONATE 10 % IV SOLN
INTRAVENOUS | Status: AC
  Administered 2018-10-18: 1 g
  Filled 2018-10-18: qty 10

## 2018-10-18 MED ORDER — DEXTROSE 50 % IV SOLN
INTRAVENOUS | Status: AC
  Filled 2018-10-18: qty 50

## 2018-10-18 MED ORDER — FENTANYL CITRATE (PF) 100 MCG/2ML IJ SOLN
INTRAMUSCULAR | Status: AC
  Filled 2018-10-18: qty 2

## 2018-10-18 MED ORDER — DULOXETINE HCL 30 MG PO CPEP
30.0000 mg | ORAL_CAPSULE | Freq: Every day | ORAL | Status: DC
  Administered 2018-10-19 – 2018-10-26 (×8): 30 mg via ORAL
  Filled 2018-10-18 (×8): qty 1

## 2018-10-18 MED ORDER — DOPAMINE-DEXTROSE 3.2-5 MG/ML-% IV SOLN: 10.0000 ug/kg/min | INTRAVENOUS | Status: DC

## 2018-10-18 MED ORDER — FENTANYL 2500MCG IN NS 250ML (10MCG/ML) PREMIX INFUSION
25.0000 ug/h | INTRAVENOUS | Status: DC
  Administered 2018-10-18: 25 ug/h via INTRAVENOUS
  Filled 2018-10-18 (×2): qty 250

## 2018-10-18 MED ORDER — DEXTROSE 10 % IV SOLN
2.0000 mL/kg/h | INTRAVENOUS | Status: DC
  Administered 2018-10-18: 2 mL/kg/h via INTRAVENOUS

## 2018-10-18 MED ORDER — GLUCAGON HCL RDNA (DIAGNOSTIC) 1 MG IJ SOLR
INTRAMUSCULAR | Status: AC
  Administered 2018-10-18: 1 mg
  Filled 2018-10-18: qty 1

## 2018-10-18 MED ORDER — FAMOTIDINE IN NACL 20-0.9 MG/50ML-% IV SOLN
20.0000 mg | INTRAVENOUS | Status: DC
  Administered 2018-10-18 – 2018-10-19 (×2): 20 mg via INTRAVENOUS
  Filled 2018-10-18 (×2): qty 50

## 2018-10-18 MED ORDER — SODIUM CHLORIDE 0.9 % IV SOLN
INTRAVENOUS | Status: DC | PRN
  Administered 2018-10-19: 06:00:00 via INTRA_ARTERIAL

## 2018-10-18 MED ORDER — DOPAMINE-DEXTROSE 3.2-5 MG/ML-% IV SOLN
INTRAVENOUS | Status: AC
  Filled 2018-10-18: qty 250

## 2018-10-18 MED ORDER — HEPARIN SODIUM (PORCINE) 5000 UNIT/ML IJ SOLN: 5000.0000 [IU] | Freq: Three times a day (TID) | INTRAMUSCULAR | Status: DC

## 2018-10-18 MED ORDER — NOREPINEPHRINE 4 MG/250ML-% IV SOLN
0.0000 ug/min | INTRAVENOUS | Status: DC
  Filled 2018-10-18: qty 250

## 2018-10-18 MED ORDER — FENTANYL BOLUS VIA INFUSION
25.0000 ug | INTRAVENOUS | Status: DC | PRN
  Administered 2018-10-19: 25 ug via INTRAVENOUS
  Filled 2018-10-18: qty 25

## 2018-10-18 MED ORDER — NOREPINEPHRINE 4 MG/250ML-% IV SOLN
INTRAVENOUS | Status: AC
  Filled 2018-10-18: qty 250

## 2018-10-18 MED ORDER — FENTANYL CITRATE (PF) 100 MCG/2ML IJ SOLN: 50.0000 ug | Freq: Once | INTRAMUSCULAR | Status: DC

## 2018-10-18 MED ORDER — SODIUM CHLORIDE 0.9 % IV SOLN
0.0000 [IU]/kg/h | INTRAVENOUS | Status: DC
  Administered 2018-10-18: 1 [IU]/kg/h via INTRAVENOUS
  Filled 2018-10-18: qty 10

## 2018-10-18 MED ORDER — EPINEPHRINE PF 1 MG/10ML IJ SOSY
0.5000 mg | PREFILLED_SYRINGE | Freq: Once | INTRAMUSCULAR | Status: AC
  Administered 2018-10-18: 0.5 mg via INTRAVENOUS

## 2018-10-18 MED ORDER — APIXABAN 5 MG PO TABS
5.0000 mg | ORAL_TABLET | Freq: Two times a day (BID) | ORAL | Status: DC
  Administered 2018-10-19 – 2018-10-20 (×3): 5 mg via ORAL
  Filled 2018-10-18 (×3): qty 1

## 2018-10-18 MED ORDER — QUETIAPINE FUMARATE 50 MG PO TABS
50.0000 mg | ORAL_TABLET | Freq: Every day | ORAL | Status: DC
  Administered 2018-10-19: 50 mg via ORAL
  Filled 2018-10-18: qty 1

## 2018-10-18 MED ORDER — NOREPINEPHRINE 4 MG/250ML-% IV SOLN
0.0000 ug/min | INTRAVENOUS | Status: DC
  Administered 2018-10-18: 14 ug/min via INTRAVENOUS

## 2018-10-18 MED ORDER — ONDANSETRON HCL 4 MG/2ML IJ SOLN
4.0000 mg | Freq: Once | INTRAMUSCULAR | Status: AC
  Administered 2018-10-18: 4 mg via INTRAVENOUS

## 2018-10-18 MED ORDER — MIDAZOLAM HCL 2 MG/2ML IJ SOLN: 1.0000 mg | INTRAMUSCULAR | Status: DC | PRN

## 2018-10-18 MED ORDER — DOPAMINE-DEXTROSE 3.2-5 MG/ML-% IV SOLN
10.0000 ug/kg/min | INTRAVENOUS | Status: DC
  Administered 2018-10-18: 10 ug/kg/min via INTRAVENOUS

## 2018-10-18 MED ORDER — PROPOFOL 10 MG/ML IV BOLUS: INTRAVENOUS | Status: AC | PRN

## 2018-10-18 MED ORDER — SODIUM CHLORIDE 0.9 % IV SOLN
Freq: Once | INTRAVENOUS | Status: AC
  Administered 2018-10-18: 19:00:00 via INTRAVENOUS

## 2018-10-18 MED ORDER — INSULIN ASPART 100 UNIT/ML ~~LOC~~ SOLN
SUBCUTANEOUS | Status: AC
  Filled 2018-10-18: qty 1

## 2018-10-18 MED ORDER — PROPOFOL 1000 MG/100ML IV EMUL
INTRAVENOUS | Status: AC
  Filled 2018-10-18: qty 100

## 2018-10-18 MED ORDER — DEXTROSE 50 % IV SOLN
0.5000 g/kg | Freq: Once | INTRAVENOUS | Status: AC | PRN
  Administered 2018-10-18: 42.65 g via INTRAVENOUS

## 2018-10-18 MED ORDER — SODIUM BICARBONATE 8.4 % IV SOLN
INTRAVENOUS | Status: AC
  Administered 2018-10-18: 20:00:00
  Filled 2018-10-18: qty 50

## 2018-10-18 MED ORDER — SODIUM CHLORIDE 0.9 % IV SOLN
1.0000 [IU]/kg/h | INTRAVENOUS | Status: DC
  Administered 2018-10-18: 1 [IU]/kg/h via INTRAVENOUS
  Filled 2018-10-18: qty 10

## 2018-10-18 MED ORDER — SODIUM CHLORIDE 0.9 % IV BOLUS: 1000.0000 mL | Freq: Once | INTRAVENOUS | Status: DC

## 2018-10-18 MED ORDER — INSULIN REGULAR(HUMAN) IN NACL 100-0.9 UT/100ML-% IV SOLN
INTRAVENOUS | Status: AC
  Filled 2018-10-18: qty 100

## 2018-10-18 MED ORDER — EPINEPHRINE PF 1 MG/10ML IJ SOSY
PREFILLED_SYRINGE | INTRAMUSCULAR | Status: AC | PRN
  Administered 2018-10-18 (×2): 0.5 mg via INTRAVENOUS

## 2018-10-18 MED ORDER — BUPROPION HCL 100 MG PO TABS
100.0000 mg | ORAL_TABLET | Freq: Three times a day (TID) | ORAL | Status: DC
  Administered 2018-10-19 – 2018-10-26 (×23): 100 mg via ORAL
  Filled 2018-10-18 (×27): qty 1

## 2018-10-18 MED ORDER — ROCURONIUM BROMIDE 50 MG/5ML IV SOLN
INTRAVENOUS | Status: AC | PRN
  Administered 2018-10-18: 50 mg via INTRAVENOUS

## 2018-10-18 MED ORDER — MIDAZOLAM HCL 5 MG/5ML IJ SOLN
INTRAMUSCULAR | Status: AC
  Filled 2018-10-18: qty 5

## 2018-10-18 NOTE — Procedures (Signed)
Arterial Catheter Insertion Procedure Note KYNLI AJELLO [id number] [birth year]  Procedure: Insertion of Arterial Catheter  Indications: Blood pressure monitoring  Procedure Details Consent: Risks of procedure as well as the alternatives and risks of each were explained to the (patient/caregiver).  Consent for procedure obtained. and Unable to obtain consent because of emergent medical necessity. Time Out: Verified patient identification, verified procedure, site/side was marked, verified correct patient position, special equipment/implants available, medications/allergies/relevent history reviewed, required imaging and test results available.  Performed  Maximum sterile technique was used including antiseptics, cap, gloves, gown, hand hygiene, mask and sheet. Skin prep: Chlorhexidine; local anesthetic administered 20 gauge catheter was inserted into right radial artery using the Seldinger technique. ULTRASOUND GUIDANCE USED: NO Evaluation Blood flow good; BP tracing good. Complications: No apparent complications.   Berton Bon 10/18/2018

## 2018-10-18 NOTE — Code Documentation (Signed)
Propofol was initially initiated at after intubation, but bp would not tolerated so discontinued. Difficultly charting in [facility]

## 2018-10-18 NOTE — Code Documentation (Signed)
Pt beginning to become nauseated.  Is still alert and conversive.

## 2018-10-18 NOTE — Code Documentation (Signed)
Pt continues to be bradycardic, hypotensive and hypoxic. Preparing for intubation

## 2018-10-18 NOTE — ED Notes (Signed)
Pt brought back to treatment room. Dr. Hyacinth Meeker at bedside, speaking with CCM, okay for transport. Pt taken back out to [facility] truck

## 2018-10-18 NOTE — ED Notes (Signed)
Update has been given to [place] at [facility]H.

## 2018-10-18 NOTE — Code Documentation (Signed)
Family at beside. Family given emotional support. 

## 2018-10-18 NOTE — Code Documentation (Signed)
CBG:375 

## 2018-10-18 NOTE — ED Notes (Signed)
Dr. Hyacinth Meeker successful on first attempt for central line insertion.

## 2018-10-18 NOTE — H&P (Signed)
NAME:  Lauren Baker, MRN:  [id number], DOB:  08-Sep-1940, LOS: 0 ADMISSION DATE:  10/18/2018, CONSULTATION DATE:  10/18/18 REFERRING MD:  AP, CHIEF COMPLAINT:  Bradycardia   Brief History   78 yo WF with AF come with suspected bradycardia of iatrogenesis vs unintentional overdose of both calcium channel and beta blockade. [facility] and [facility].  History of present illness   The patient was apparently found down at her home by EMS for an estimated 2 hours, she was able to phone for help prior to becoming obtunded again. Upon their arrival along with family, they found her to be cyanotic and profoundly bradycardic. She was given atropine in the field and a transcutaneous pacer was applied. After arriving the ED epinephrine was administered the patient was able to explain to the ED physician that she may have taken too much of her medication. The patient did not maintain her mentation and required intubation. Interventions in the ED included CVL placement, insulin dosing, and calcium by bolus dose, several partial doses of epinephrine and the initiation of a Levophed drip. Despite these measures the patient remained bradycardic and dopamine was added, external pacing at 70ppm/100MA was resume and maintained while in transport by [facility].  Presently she is on Levophed and Dopamine at 20 mcg, and Epinephrine at 6. Glucagon given and Insulin running at 1u/kg/hr as therapy for medication induced bradycardia.   Recent Outpatient Record Appended Here    Subjective "Lauren Baker is a 78 y.o. female with past medical history of permanent atrial fibrillation (on Eliquis), HTN, and orthostatic dizziness who presents to the office today for 4-week follow-up.   She was last examined by Dr. Wyline MoodBranch in 05/2018 and reported intermittent episodes of dizziness, mostly occurring with standing. BP was 156/102 at the time of her visit but HR was elevated  into the 120's. Cardizem was further titrated to 60mg  TID with plans to consolidate to long-acting Cardizem CD pending her response. Was continued on Toprol-XL 200mg  daily and wished to hold off on DCCV if possible. A 21-day event monitor was recommended for further assessment and showed atrial fibrillation with variable rates, peaking into the 150's and a 5 beat run of NSVT. Cardizem was further titrated to 90mg  TID when her monitor resulted.   In talking with the patient today, she reports overall doing well from a cardiac perspective since her last office visit.  She denies any recent chest pain, dyspnea on exertion, palpitations, orthopnea, PND, or lower extremity edema. She has overall been asymptomatic with her arrhythmia but does report she was having a variable heart rate when this was checked at home. Says that her HR has normalized since Diltiazem was further titrated to 90 mg TID.  She reports good compliance with Eliquis and denies missing any recent doses. No recent melena, hematochezia, or hematuria. Does experience easy bruising. A/P 1. Persistent Atrial Fibrillation/ Use of Long-Term Anticoagulation - she denies any recent palpitations or dyspnea, reporting overall being asymptomatic with her arrhythmia. HR is well-controlled in the 60's during today's visit. Will continue Toprol-XL 200mg  daily and consolidate Cardizem 90mg  TID to Cardizem CD 300mg  daily.  - she denies any evidence of active bleeding. Continue Eliquis 5mg  BID for anticoagulation.   2. HTN - BP is well-controlled at 124/73 during today's visit. - continue Toprol-XL 200mg  daily and Losartan 50mg  daily with consolidation of short-acting Cardizem to Cardizem CD as outlined above.  Medication Adjustments/Labs  and Tests Ordered: Current medicines are reviewed at length with the patient today.  Concerns regarding medicines are outlined above.  Medication changes, Labs and Tests ordered today are listed in the Patient  Instructions below. Patient Instructions  Medication Instructions:  Your physician has recommended you make the following change in your medication:  Cardizem CD 300 mg Daily"  Randall An, PA-C   Past Medical History  AF HTN Detatched Retina  Social History  Married Denied Tobacco or Ethanol per record   Family Hx  CVA, mother HTN, sister MI/CAD, brother and sister  Significant Hospital Events   New Admission  Consults:  Cards is Primary   Procedures:  Pending TVP  Significant Diagnostic Tests:  Echocardiogram: 03/2018 Study Conclusions  - Left ventricle: The cavity size was normal. There was mild focal basal hypertrophy of the septum. Systolic function was normal. The estimated ejection fraction was in the range of 55% to 60%. Wall motion was normal; there were no regional wall motion abnormalities. - Aortic valve: There was mild regurgitation. - Mitral valve: Calcified annulus. There was mild regurgitation. - Left atrium: The atrium was moderately dilated. - Pulmonary arteries: Systolic pressure was mildly increased. PA peak pressure: 40 mm Hg (S).  Impressions:  - Normal LV systolic function; calcified aortic valve with mild AI; mild MR; moderate LAE; mild TR with mild pulmonary hypertension.  Event Monitor: 05/2018  21 day event monitor  No symptoms reported  Telemetry tracings show afib with variable rates. Some episodes of RVR highest rate up to 150 bpm.  5 beat run of NSVT   Micro Data:  Pending  Antimicrobials:  None   Review of Systems  Precluded by patient acuity and intubation  Objective   Blood pressure (!) 67/35, pulse 61, resp. rate (!) 30, weight 85.3 kg, SpO2 100 %.       No intake or output data in the 24 hours ending 10/18/18 2121 Filed Weights   10/18/18 1949 10/18/18 1952  Weight: 85.3 kg 85.3 kg    Examination: General: The patient is sedated and intubated in no apparent respiratory  distress, external pacing HENT: Mild divergence of gaze noted, pupil are mid-point and sluggish  Oropharynx appears moist, wider evaluation precluded by ETT Lungs: There is good aeration to the lung fields anteriorly no rales at this time Cardiovascular: TCP rate has been 70, pulses are diminished  Abdomen: SNT, globular, bowel sounds are diminished Extremities: Perhaps trace edema Neuro: Sedated GU: deferred  Assessment & Plan:  Drug Induced Bradycardia:  The patient is currently TCP but will likely move to TVP There are several pressors aboard, which will be weaned as allowable by rate control and MAP  -dopamine 20  -epi 6  -levo 20 Will continue 1u/kg/hr insulin by drip, glucagon had been given, support with any dextrose required  Monitor calcium level and replace or drip as indicated Avoid metabolic disarray from fluids etc. Remainder per cardiology orders  Acute Hypoxemic/Hypercapnic Respiratory Failure: Critical care mechanical ventilation protocol for normalized pH   Best practice:  Diet: NPO Pain/Anxiety/Delirium protocol (if indicated): RASS goal of ZERO with Fentanyl   VAP protocol (if indicated): Yes DVT prophylaxis: SQH GI prophylaxis: H2B Glucose control: Glycemic monitoring once initial acute insulin dosing is resolved Mobility: passive Code Status: full Family Communication: no family at bedside Disposition: ICU   Labs   CBC: Recent Labs  Lab 10/18/18 1735  WBC 12.4*  HGB 13.3  HCT 42.7  MCV 100.0  PLT 180  Basic Metabolic Panel: Recent Labs  Lab 10/18/18 1735  NA 137  K 4.2  CL 106  CO2 11*  GLUCOSE 396*  BUN 15  CREATININE 1.64*  CALCIUM 8.9   GFR: Estimated Creatinine Clearance: 34.6 mL/min (A) (by C-G formula based on SCr of 1.64 mg/dL (H)). Recent Labs  Lab 10/18/18 1735 10/18/18 1846  WBC 12.4*  --   LATICACIDVEN  --  7.3*    Liver Function Tests: Recent Labs  Lab 10/18/18 1735  AST 69*  ALT 51*  ALKPHOS 87    BILITOT 0.9  PROT 6.0*  ALBUMIN 3.0*   No results for input(s): LIPASE, AMYLASE in the last 168 hours. No results for input(s): AMMONIA in the last 168 hours.  ABG    Component Value Date/Time   PHART 7.214 (L) 10/18/2018 1838   PCO2ART 39.3 10/18/2018 1838   PO2ART 82.8 (L) 10/18/2018 1838   HCO3 15.4 (L) 10/18/2018 1838   ACIDBASEDEF 11.0 (H) 10/18/2018 1838   O2SAT 93.1 10/18/2018 1838     Coagulation Profile: Recent Labs  Lab 10/18/18 1735  INR 1.8*    Cardiac Enzymes: Recent Labs  Lab 10/18/18 1735  TROPONINI <0.03    HbA1C: No results found for: HGBA1C  CBG: Recent Labs  Lab 10/18/18 1826 10/18/18 1837 10/18/18 1917 10/18/18 1940 10/18/18 2018  GLUCAP 337* 308* 202* 166* 96     Past Medical History  She,  has a past medical history of Chronic atrial fibrillation and Hypertension.   Surgical History    Past Surgical History:  Procedure Laterality Date  . detatched retina       Social History   reports that she has never smoked. She has never used smokeless tobacco. She reports that she does not drink alcohol or use drugs.   Family History   Her family history includes CVA in her mother; Cancer in her sister; Heart attack in her brother and sister; Hypertension in her sister.   Allergies No Known Allergies   Home Medications  Prior to Admission medications   Medication Sig Start Date End Date Taking? Authorizing Provider  buPROPion (WELLBUTRIN) 100 MG tablet Take 100 mg by mouth 3 (three) times daily. 02/26/18   [provider]  cholecalciferol (VITAMIN D) 1000 units tablet Take 1,000 Units by mouth daily.    [provider]  clonazePAM (KLONOPIN) 0.5 MG tablet Take 0.5 mg by mouth 2 (two) times daily. 01/19/18   [provider]  diltiazem (CARDIZEM CD) 300 MG 24 hr capsule Take 1 capsule (300 mg total) by mouth daily. 07/02/18   Strader, Lennart Pall, PA-C  DULoxetine (CYMBALTA) 30 MG capsule Take by mouth daily.  12/13/17   [provider]  ELIQUIS 5 MG TABS tablet TAKE 1 TABLET BY MOUTH TWICE A DAY 10/13/18   Antoine Poche, MD  losartan (COZAAR) 50 MG tablet Take 1 tablet (50 mg total) by mouth daily. 08/05/18 11/03/18  Antoine Poche, MD  metoprolol succinate (TOPROL-XL) 100 MG 24 hr tablet Take 2 tablets (200 mg total) by mouth daily. 07/21/18   Antoine Poche, MD  QUEtiapine (SEROQUEL) 50 MG tablet Take 50 mg by mouth daily. 02/26/18   [provider]     Critical care time: 

## 2018-10-18 NOTE — H&P (Signed)
Cardiology History and Physical    Patient ID: Lauren Baker MRN: [id number], DOB/AGE: 1941/07/01   Admit date: 10/18/2018  Primary Physician: Elfredia Nevins, MD Primary Cardiologist: Dina Rich, MD  Patient Profile    Lauren Baker is a 78 y.o. female with a history of depression, permanent atrial fibrillation on Eliquis, and hypertension. She was initially transported to [facility] for profound symptomatic bradycardia and subsequently transferred to [facility].    History of Present Illness    Per family and [facility] ED, she called her family earlier this afternoon due to profound weakness. It actually took her 2 hours to reach a phone. Family called EMS who found her severely bradycardic with a heart rate approximately 20 bpm, pale, mottled and slightly hypoxic. The patient was responsive but was becoming progressively obtunded. Initial ECG showed AF with rate of 18 and otherwise similar to baseline. She was given 1 mg atropine on arrival to the emergency department with no improvement, so was transcutaneously paced and given epinephrine 0.5mg  with significant temporary improvement - she apparently reported that she may have accidentally taken too much of her medications. She is on high doses of diltiazem and metoprolol, which have gradually been up-titrated for difficult AF rate control to Diltiazem ER 300mg  daily and Toprol XL 200mg  daily.   She subsequently decompensated, becoming more hypotensive and hypoxic with recurrent bradycardia. Labs were significant for ABG 7.21/39/82, bicarb 11, lactate 7.3, glucose 396, Cr 1.64, troponin negative. She was intubated and transcutaneous pacing re-initiated. She was treated with calcium, glucagon, and high-dose insulin for presumed BB/CCB toxicity, but still required escalating vasoactive support with norepi, dopamine, and epinephrine at the time of transfer. She was also started on bicarb.   Regarding her recent history, she is followed by Dr.  Wyline Mood for her AF. At her recent visits, she has been continued on Toprol XL 200 mg daily and diltiazem titrated up to 90mg  TID based on 21-day monitoring results - this has been consolidated to 300mg  daily with appropriate rate control. During her last cardiology office visit on 07/02/18, she had been doing well from a cardiac perspective. Per her family, she saw her PCP within the last week (outside of our system) and was given a refill of her metoprolol, however, I personally reviewed her pill bottles and this was for 50mg  daily (50mg  tablets). She realized this was an error so reportedly took 4 tablets today. She also seems to be missing at least 2 of the 200mg  tablets from her previous prescription, unclear if also taken today. Typically she manages her own medications well without any problems, but may have accidentally taken too much of the metoprolol today. Per family, until today she was in her normal state of health.  Past Medical History   Past Medical History:  Diagnosis Date  . Chronic atrial fibrillation   . Hypertension     Past Surgical History:  Procedure Laterality Date  . detatched retina       No Known Allergies Inpatient Medications    . fentaNYL      . insulin aspart      . Insulin Regular(Human) in NaCl      . Insulin Regular(Human) in NaCl      . Insulin Regular(Human) in NaCl      . midazolam        Family History    Family History  Problem Relation Age of Onset  . CVA Mother   . Hypertension Sister   .  Heart attack Brother   . Heart attack Sister   . Cancer Sister     Social History    Lives alone independently. Family in close contact. No tobacco, alcohol, or illicit drug use.   Review of Systems    Unable to obtain due to patient condition.  Physical Exam    Blood pressure (!) 67/35, pulse 61, resp. rate (!) 30, weight 85.3 kg, SpO2 100 %.    No intake or output data in the 24 hours ending 10/18/18 2022 Wt Readings from Last 3 Encounters:    10/18/18 85.3 kg  07/02/18 85.3 kg  05/28/18 83.5 kg   Vent Mode: PRVC FiO2 (%):  [70 %-100 %] 70 % Set Rate:  [16 bmp-22 bmp] 22 bmp Vt Set:  [510 mL] 510 mL PEEP:  [5 cmH20] 5 cmH20 Plateau Pressure:  [16 cmH20-18 cmH20] 16 cmH20  CONSTITUTIONAL: Intubated and sedated to RAAS -3, in no apparent distress, externally paced at 80 bpm. HEENT: atraumatic, conjunctiva normal, slightly disconjugate gaze, pupils equal, ETT secured in place. NECK: trachea midline CARDIOVASCULAR: Irregular rhythm. No gallop, murmur, or rub appreciated. Femoral pulses intact with 1:1 electrical:mechanical capture prior to discontinuation of pacing. PULMONARY/CHEST WALL: no deformities, clear mechanically ventilated lung sounds anteriorly, synchronous with vent ABDOMINAL: soft, non-tender, non-distended EXTREMITIES: no edema or muscle atrophy, cool and slightly mottled. SKIN: Dry and intact without apparent rashes or wounds. NEUROLOGIC: heavily sedated   Labs    Recent Labs    10/18/18 1735  TROPONINI <0.03   Lab Results  Component Value Date   WBC 12.4 (H) 10/18/2018   HGB 13.3 10/18/2018   HCT 42.7 10/18/2018   MCV 100.0 10/18/2018   PLT 180 10/18/2018    Recent Labs  Lab 10/18/18 1735  NA 137  K 4.2  CL 106  CO2 11*  BUN 15  CREATININE 1.64*  CALCIUM 8.9  PROT 6.0*  BILITOT 0.9  ALKPHOS 87  ALT 51*  AST 69*  GLUCOSE 396*    Radiology Studies    Dg Chest Port 1 View  Result Date: 10/18/2018 CLINICAL DATA:  Intubation EXAM: PORTABLE CHEST 1 VIEW COMPARISON:  10/18/2018 FINDINGS: Endotracheal tube tip measures 3.7 cm above the carina. Enteric tube tip is off field of view but below the left hemidiaphragm. Shallow inspiration. Cardiac enlargement with mild vascular congestion. Slight interstitial pattern to the lungs may indicate mild edema. No focal consolidation. No blunting of costophrenic angles. No pneumothorax. IMPRESSION: Appliances appear in satisfactory position. Cardiac  enlargement with mild vascular congestion and mild interstitial edema. Electronically Signed   By: Burman Nieves M.D.   On: 10/18/2018 19:53   Dg Chest Port 1 View  Result Date: 10/18/2018 CLINICAL DATA:  Found on the floor, cyanotic. EXAM: PORTABLE CHEST 1 VIEW COMPARISON:  None. FINDINGS: Artifact overlies the chest. Heart size is normal. Poor inspiration. Allowing for technical factors, the lungs are clear. No consolidation, collapse or effusion. IMPRESSION: No active disease evident allowing for technical factors. Electronically Signed   By: Paulina Fusi M.D.   On: 10/18/2018 19:18    ECG & Cardiac Imaging    Echocardiogram 03/2018 - Left ventricle: The cavity size was normal. There was mild focabasal hypertrophy of the septum. Systolic function was normal.The estimated ejection fraction was in the range of 55% to 60%.Wall motion was normal; there were no regional wall motionabnormalities. - Aortic valve: There was mild regurgitation. Calcified. - Mitral valve: Calcified annulus. There was mild regurgitation. - Left atrium: The  atrium was moderately dilated. - Pulmonary arteries: Systolic pressure was mildly increased. PApeak pressure: 40 mm Hg (S).  Event Monitor: 05/2018  21 day event monitor  No symptoms reported  Telemetry tracings show afib with variable rates. Some episodes of RVR highest rate up to 150 bpm.  5 beat run of NSVT  ECG  - personally reviewed.  Assessment & Plan    1. Unstable bradycardia with shock, presumed accidental BB/CCB toxicity: Intrinsic rhythm is AF with rates high 50s-60s without hemodynamic compromise as pacing is weaned down - does not appear to be benefiting with current pharmacologic support. We can hold off on TVP for now as long as vasoactives can be weaned to more reasonable doses - currently appears to be tolerating this as well. On exam she is cool but with normal LV systolic function (again on significant pharmacologic support). Does  not seem to be vasodilated. I suspect this is primarily beta blocker toxicity.  - Pacing paused for now, pads to remain in place - Continue insulin infusion at 1u/kg/hr until inotropic support is weaned and LV function reassessed. Frequent glucose/K+ monitoring - con. Continuous D10 infusion for goal glucose 140-180. - Dopamine 20 -> 10 - Epi 8 -> 5 - Levophed 20, weaning down - Repeat lactate - Add TSH on to initial labs  2. Acute metabolic acidosis with elevated AG: lactic acidosis, AKI, medications. - Stop bicarb infusion - Mechanical ventilation as above  3. AKI: secondary to shock. - Vasoactives now in range to promote renal perfusion - Monitor strict I&Os, daily labs. Foley in place.  4. Hypoxic and hypercapnic respiratory failure:  - Improved with mechanical ventilation - CCM following for vent management  5. Chronic AF:  - Continue Eliquis 5mg  BID - Will need to restart diltiazem and metoprolol prior to discharge, currently held.  6. Depression: family seem sure of no possible intentional OD - Continue home meds  ICU Routine Care: Diet: NPO Pain/Anxiety/Delirium protocol: per CCM DVT prophylaxis: On Eliquis GI prophylaxis: H2B Early Mobility Full Code Plan and current status discussed with family Disposition: ICU   Signed, Clerance LavScott W Chapin Arduini, MD 10/18/2018, 8:22 PM

## 2018-10-18 NOTE — Code Documentation (Signed)
Insulin bolus complete

## 2018-10-18 NOTE — ED Notes (Signed)
Dr. Hyacinth Meeker speaking with cardiology.  Pt beginning to brady down into the 40s at this time.

## 2018-10-18 NOTE — ED Notes (Signed)
ED Provider at bedside. 

## 2018-10-18 NOTE — ED Notes (Signed)
100 units/ of Insulin hung at 1840. Per Dr. Hyacinth Meeker, will be given over 1 hour. 138ml/hr.

## 2018-10-18 NOTE — ED Notes (Signed)
[facility] given report.  

## 2018-10-18 NOTE — Code Documentation (Signed)
Epi 0.5mg  IV given for HR in 30s.  Preparing for central line placement.

## 2018-10-18 NOTE — ED Notes (Signed)
Dopamine increased to 22mcg/kg  by [facility] staff. On [facility] monitor, 73/38, map 50, continues to be externally paced.

## 2018-10-18 NOTE — Progress Notes (Signed)
RR increased to 22 pr MD after ABG results.

## 2018-10-18 NOTE — Code Documentation (Signed)
[facility] remains at bedside to stabilize for transport

## 2018-10-18 NOTE — ED Notes (Signed)
Levophed drip initiated by [facility] staff per their protocol

## 2018-10-18 NOTE — ED Notes (Addendum)
Pt arrived with altered loc and moderate cyanosis.  Dr. Hyacinth Meeker at bedside to assess.  Atropine 1mg  given on arrival to ED with no improvement.  Pacer applied with rate set at 70ppm and .  Pt had immediate improvement with this.  Verified with manual pulses and improvement in bp andmental status.  Given Epi 0.5mg  ivp and pacer paused.  Improvement with rate of 50.  Pacer off at this time per Dr. Hyacinth Meeker.  Pt states she may have accidentally taken too much of her medication.  Was preparing pt for intubation, however due to improvement will hold off on this.  Pt noted to have unequal pupils as well on arrival to ED.

## 2018-10-18 NOTE — ED Provider Notes (Addendum)
[facility] EMERGENCY DEPARTMENT Provider Note   CSN: [id number] Arrival date & time: 10/18/18  1721    History   Chief Complaint Chief Complaint  Patient presents with  . Bradycardia    HPI Lauren Baker is a 78 y.o. female.     HPI  Lauren Baker a 84-year-old female, she has a known history of chronic atrial fibrillation and high blood pressure, she is currently taking a medication including Toprol-XL 200 mg in the morning as well as diltiazem 300 mg in the morning, around 2:00 in the afternoon she felt severe weakness said in and it was a couple hours later that she was able to call her family member as she had been on the floor for 2 hours and unable to get to a phone.  The family member called paramedics who found her to be severely bradycardic with a heart rate approximately 20 bpm, pale, mottled and slightly hypoxic.  The patient was able to be aroused but was becoming more and more obtunded.  Level 5 caveat applies due to severe illness.  The patient is able to wake up long enough to tell me she is unsure if she took too much of her medications.  Past Medical History:  Diagnosis Date  . Chronic atrial fibrillation   . Hypertension     Patient Active Problem List   Diagnosis Date Noted  . Bradycardia 10/18/2018  . Chronic atrial fibrillation 03/12/2018  . Essential hypertension 03/12/2018    Past Surgical History:  Procedure Laterality Date  . detatched retina       OB History   No obstetric history on file.      Home Medications    Prior to Admission medications   Medication Sig Start Date End Date Taking? Authorizing Provider  buPROPion (WELLBUTRIN) 100 MG tablet Take 100 mg by mouth 3 (three) times daily. 02/26/18   [provider]  cholecalciferol (VITAMIN D) 1000 units tablet Take 1,000 Units by mouth daily.    [provider]  clonazePAM (KLONOPIN) 0.5 MG tablet Take 0.5 mg by mouth 2 (two) times daily. 01/19/18   [provider]    diltiazem (CARDIZEM CD) 300 MG 24 hr capsule Take 1 capsule (300 mg total) by mouth daily. 07/02/18   Strader, Fransisco Hertz, PA-C  DULoxetine (CYMBALTA) 30 MG capsule Take by mouth daily. 12/13/17   [provider]  ELIQUIS 5 MG TABS tablet TAKE 1 TABLET BY MOUTH TWICE A DAY 10/13/18   Arnoldo Lenis, MD  losartan (COZAAR) 50 MG tablet Take 1 tablet (50 mg total) by mouth daily. 08/05/18 11/03/18  Arnoldo Lenis, MD  metoprolol succinate (TOPROL-XL) 100 MG 24 hr tablet Take 2 tablets (200 mg total) by mouth daily. 07/21/18   Arnoldo Lenis, MD  QUEtiapine (SEROQUEL) 50 MG tablet Take 50 mg by mouth daily. 02/26/18   [provider]    Family History Family History  Problem Relation Age of Onset  . CVA Mother   . Hypertension Sister   . Heart attack Brother   . Heart attack Sister   . Cancer Sister     Social History Social History   Tobacco Use  . Smoking status: Never Smoker  . Smokeless tobacco: Never Used  Substance Use Topics  . Alcohol use: No  . Drug use: No     Allergies   Patient has no known allergies.   Review of Systems Review of Systems  Unable to perform ROS: Acuity of  condition     Physical Exam Updated Vital Signs BP (!) 85/43   Pulse 60   Resp 19   SpO2 100%   Physical Exam Constitutional:      Comments: Ill-appearing, somnolent to obtunded  HENT:     Head:     Comments: Atraumatic, pale Eyes:     Comments: Conjunctive a pale, sclera without jaundice, pupils are bilaterally reactive  Neck:     Comments: No JVD, no lymphadenopathy, supple neck Cardiovascular:     Comments: Severe bradycardia, very weak pulses, capillary refill is extremely slowed Pulmonary:     Comments: Tachypneic, shallow breathing, hypoxic Abdominal:     Comments: Soft nontender abdomen  Musculoskeletal:     Comments: No significant edema  Skin:    Comments: Pale, mottled, diaphoretic  Neurological:     Comments: Obtunded but arousable to  loud voice and pain, follows commands but severely generally weak      ED Treatments / Results  Labs (all labs ordered are listed, but only abnormal results are displayed) Labs Reviewed  CBC - Abnormal; Notable for the following components:      Result Value   WBC 12.4 (*)    All other components within normal limits  PROTIME-INR - Abnormal; Notable for the following components:   Prothrombin Time 20.4 (*)    INR 1.8 (*)    All other components within normal limits  COMPREHENSIVE METABOLIC PANEL - Abnormal; Notable for the following components:   CO2 11 (*)    Glucose, Bld 396 (*)    Creatinine, Ser 1.64 (*)    Total Protein 6.0 (*)    Albumin 3.0 (*)    AST 69 (*)    ALT 51 (*)    GFR calc non Af Amer 30 (*)    GFR calc Af Amer 35 (*)    Anion gap 20 (*)    All other components within normal limits  LACTIC ACID, PLASMA - Abnormal; Notable for the following components:   Lactic Acid, Venous 7.3 (*)    All other components within normal limits  BLOOD GAS, ARTERIAL - Abnormal; Notable for the following components:   pH, Arterial 7.214 (*)    pO2, Arterial 82.8 (*)    Bicarbonate 15.4 (*)    Acid-base deficit 11.0 (*)    All other components within normal limits  CBG MONITORING, ED - Abnormal; Notable for the following components:   Glucose-Capillary 363 (*)    All other components within normal limits  CBG MONITORING, ED - Abnormal; Notable for the following components:   Glucose-Capillary 375 (*)    All other components within normal limits  CBG MONITORING, ED - Abnormal; Notable for the following components:   Glucose-Capillary 337 (*)    All other components within normal limits  CBG MONITORING, ED - Abnormal; Notable for the following components:   Glucose-Capillary 308 (*)    All other components within normal limits  CBG MONITORING, ED - Abnormal; Notable for the following components:   Glucose-Capillary 202 (*)    All other components within normal limits    CULTURE, BLOOD (ROUTINE X 2)  CULTURE, BLOOD (ROUTINE X 2)  TROPONIN I  LACTIC ACID, PLASMA  ACETAMINOPHEN LEVEL  SALICYLATE LEVEL  URINALYSIS, ROUTINE W REFLEX MICROSCOPIC  BETA-HYDROXYBUTYRIC ACID    EKG  ED ECG REPORT  I personally interpreted this EKG   Date: October 18, 2018 at 5:29 PM  Rate: 10  Rhythm: atrial fibrillation  QRS Axis:  indeterminate  Intervals: unknokwn  ST/T Wave abnormalities: nonspecific ST/T changes  Conduction Disutrbances:av block  Narrative Interpretation:   Old EKG Reviewed: changes noted   ED ECG REPORT  I personally interpreted this EKG   Date: October 18, 2018 at 5:31 PM  Rate: 64  Rhythm: Atrial fibrillation  QRS Axis: normal  Intervals: normal  ST/T Wave abnormalities: nonspecific ST/T changes  Conduction Disutrbances:none  Narrative Interpretation:   Old EKG Reviewed: changes noted    Radiology Dg Chest Port 1 View  Result Date: 10/18/2018 CLINICAL DATA:  Found on the floor, cyanotic. EXAM: PORTABLE CHEST 1 VIEW COMPARISON:  None. FINDINGS: Artifact overlies the chest. Heart size is normal. Poor inspiration. Allowing for technical factors, the lungs are clear. No consolidation, collapse or effusion. IMPRESSION: No active disease evident allowing for technical factors. Electronically Signed   By: Nelson Chimes M.D.   On: 10/18/2018 19:18    Procedures .Critical Care Performed by: Noemi Chapel, MD Authorized by: Noemi Chapel, MD   Critical care provider statement:    Critical care time (minutes):  75   Critical care time was exclusive of:  Separately billable procedures and treating other patients and teaching time   Critical care was necessary to treat or prevent imminent or life-threatening deterioration of the following conditions:  Shock, circulatory failure and cardiac failure   Critical care was time spent personally by me on the following activities:  Blood draw for specimens, development of treatment plan with patient or  surrogate, discussions with consultants, evaluation of patient's response to treatment, examination of patient, obtaining history from patient or surrogate, ordering and performing treatments and interventions, ordering and review of laboratory studies, ordering and review of radiographic studies, pulse oximetry, re-evaluation of patient's condition and review of old charts .Central Line Date/Time: 10/18/2018 6:44 PM Performed by: Noemi Chapel, MD Authorized by: Noemi Chapel, MD   Consent:    Consent obtained:  Verbal   Consent given by:  Patient   Risks discussed:  Incorrect placement, arterial puncture, bleeding and infection   Alternatives discussed:  No treatment, delayed treatment and alternative treatment Pre-procedure details:    Hand hygiene: Hand hygiene performed prior to insertion     Sterile barrier technique: All elements of maximal sterile technique followed     Skin preparation:  ChloraPrep   Skin preparation agent: Skin preparation agent completely dried prior to procedure   Anesthesia (see MAR for exact dosages):    Anesthesia method:  Local infiltration   Local anesthetic:  Lidocaine 1% w/o epi Procedure details:    Location:  R femoral   Patient position:  Flat   Procedural supplies:  Triple lumen   Catheter size:  7 Fr   Landmarks identified: yes     Ultrasound guidance: yes     Sterile ultrasound techniques: Sterile gel and sterile probe covers were used     Number of attempts:  1   Successful placement: yes   Post-procedure details:    Post-procedure:  Dressing applied and line sutured   Assessment:  Free fluid flow and blood return through all ports   Patient tolerance of procedure:  Tolerated well, no immediate complications Procedure Name: Intubation Date/Time: 10/18/2018 7:38 PM Performed by: Noemi Chapel, MD Pre-anesthesia Checklist: Patient identified, Patient being monitored, Emergency Drugs available, Timeout performed and Suction available Oxygen  Delivery Method: Non-rebreather mask Preoxygenation: Pre-oxygenation with 100% oxygen Induction Type: Rapid sequence Ventilation: Mask ventilation without difficulty Laryngoscope Size: Mac and 3 Grade View: Grade II  Tube size: 7.5 mm Number of attempts: 1 Airway Equipment and Method: Stylet Placement Confirmation: ETT inserted through vocal cords under direct vision,  CO2 detector and Breath sounds checked- equal and bilateral Secured at: 20 cm Tube secured with: ETT holder Dental Injury: Teeth and Oropharynx as per pre-operative assessment  Difficulty Due To: Difficulty was unanticipated    External pacer Date/Time: 10/18/2018 7:38 PM Performed by: Noemi Chapel, MD Authorized by: Noemi Chapel, MD  Consent: Verbal consent obtained. Risks and benefits: risks, benefits and alternatives were discussed Consent given by: patient Patient understanding: patient states understanding of the procedure being performed Required items: required blood products, implants, devices, and special equipment available Patient identity confirmed: verbally with patient Time out: Immediately prior to procedure a "time out" was called to verify the correct patient, procedure, equipment, support staff and site/side marked as required. Local anesthesia used: no  Anesthesia: Local anesthesia used: no Patient tolerance: Patient tolerated the procedure well with no immediate complications Comments: Patient had capture at 70 mA at 60 bpm.  Blood pressure remained 90    (including critical care time)  Medications Ordered in ED Medications  midazolam (VERSED) 5 MG/5ML injection (  Not Given 10/18/18 1752)  fentaNYL (SUBLIMAZE) 100 MCG/2ML injection (  Not Given 10/18/18 1752)  insulin aspart (novoLOG) 100 UNIT/ML injection (has no administration in time range)  Insulin Regular(Human) in NaCl (MYXREDLIN) 100-0.9 UT/100ML-% infusion (has no administration in time range)  Insulin Regular(Human) in NaCl  (MYXREDLIN) 100-0.9 UT/100ML-% infusion (has no administration in time range)  sodium chloride 0.9 % bolus 1,000 mL (has no administration in time range)  propofol (DIPRIVAN) 1000 MG/100ML infusion (has no administration in time range)  DOPamine (INTROPIN) 3.2-5 MG/ML-% infusion (has no administration in time range)  DOPamine (INTROPIN) 800 mg in dextrose 5 % 250 mL (3.2 mg/mL) infusion (10 mcg/kg/min Intravenous New Bag/Given 10/18/18 1939)  propofol (DIPRIVAN) 10 mg/mL bolus/IV push (2,559 mcg Intravenous Given 10/18/18 1937)  glucagon (human recombinant) (GLUCAGEN) 1 MG injection (1 mg  Given 10/18/18 1740)  EPINEPHrine (ADRENALIN) 1 MG/10ML injection (0.5 mg Intravenous Given 10/18/18 1742)  calcium gluconate 10 % injection (1 g  Given 10/18/18 1743)  ondansetron (ZOFRAN) injection 4 mg (4 mg Intravenous Given 10/18/18 1749)  calcium gluconate 10 % injection (1 g  Given 10/18/18 1759)  EPINEPHrine (ADRENALIN) 1 MG/10ML injection (0.5 mg Intravenous Given 10/18/18 1822)  EPINEPHrine (ADRENALIN) 1 MG/10ML injection 0.5 mg (0.5 mg Intravenous Given 10/18/18 1842)  0.9 %  sodium chloride infusion ( Intravenous New Bag/Given 10/18/18 1854)  etomidate (AMIDATE) injection (20 mg Intravenous Given 10/18/18 1928)  rocuronium (ZEMURON) injection (50 mg Intravenous Given 10/18/18 1929)     Initial Impression / Assessment and Plan / ED Course  I have reviewed the triage vital signs and the nursing notes.  Pertinent labs & imaging results that were available during my care of the patient were reviewed by me and considered in my medical decision making (see chart for details).        The patient's presentation is very concerning for a possible calcium channel or beta blocker overdose.  She is hyperglycemic but has no history of diabetes, I suspect this is related to insulin resistance secondary to the diltiazem.  I immediately called the [facility] and with the assistance of Dr. Hassell Done have worked on a medication  regimen to help including intermittent boluses of epinephrine as the patient did not respond to prehospital or emergency department administered atropine.  The  patient has improved significantly with intermittent boluses of 0.5 mg of epinephrine however she does have an acute kidney injury and will likely need some fluid resuscitation.  We have done high-dose insulin and she received 85 units of insulin by bolus without significant improvement in her pressure.  She is received multiple boluses of calcium gluconate without significant improvement, she is likely going to require pacing.  The patient will need intensive care unit placement, I placed a central line in the right femoral vein and discussed the care with Dr. Martinique of the cardiology service who has accepted the patient.  Temporary orders were placed, critical care transport has been requested.  Due to the acute kidney injury we elected to go with temporary pacing rather than further boluses of epinephrine despite her response to that.  This was discussed with the poison control toxicologist who agreed.  The patient is now currently on dopamine, she is intubated for airway protection and for her hypoxia which trailed down to 85% despite high flow nasal cannula.  Her x-ray did not reveal pulmonary abnormalities.  I personally intubated the patient on one attempt successfully, x-ray was ordered to confirm placement.  Critical care transport team at the bedside to take the patient to [facility].  She is actively being paced on transport.  Final Clinical Impressions(s) / ED Diagnoses   Final diagnoses:  Bradycardia  Accidental overdose, initial encounter  Heart failure, unspecified HF chronicity, unspecified heart failure type (HCC)      Noemi Chapel, MD 10/18/18 Lenny Pastel    Noemi Chapel, MD 10/19/18 [phone]

## 2018-10-18 NOTE — ED Triage Notes (Signed)
Pt found on floor by family, estimated there about 2 hours.  Pt was minimally responsive and cyanotic.  Given Atropine 1mg  pta.  Pacer applied on arrival to ED.

## 2018-10-18 NOTE — ED Notes (Signed)
O2 increased to 4L 

## 2018-10-18 NOTE — Code Documentation (Signed)
Bolus of 85units of regular insulin started over 5 minutes.  Verified with Dr. Hyacinth Meeker.

## 2018-10-18 NOTE — ED Notes (Signed)
Pt's family at bedside.  Dr. Hyacinth Meeker speaking with cardiology.  Pt maintaining HR in 50s, underlying a fib.

## 2018-10-18 NOTE — Code Documentation (Addendum)
Dr. Hyacinth Meeker attempting central line in right groin.  Assessing with ultrasound.   Pt tolerating procedure well.

## 2018-10-19 ENCOUNTER — Encounter (HOSPITAL_COMMUNITY): Payer: Self-pay

## 2018-10-19 ENCOUNTER — Ambulatory Visit: Payer: Medicare HMO | Admitting: Cardiology

## 2018-10-19 ENCOUNTER — Inpatient Hospital Stay (HOSPITAL_COMMUNITY): Payer: Medicare HMO

## 2018-10-19 DIAGNOSIS — E872 Acidosis, unspecified: Secondary | ICD-10-CM | POA: Diagnosis present

## 2018-10-19 DIAGNOSIS — Z9911 Dependence on respirator [ventilator] status: Secondary | ICD-10-CM

## 2018-10-19 DIAGNOSIS — I361 Nonrheumatic tricuspid (valve) insufficiency: Secondary | ICD-10-CM

## 2018-10-19 DIAGNOSIS — T50901S Poisoning by unspecified drugs, medicaments and biological substances, accidental (unintentional), sequela: Secondary | ICD-10-CM

## 2018-10-19 DIAGNOSIS — I340 Nonrheumatic mitral (valve) insufficiency: Secondary | ICD-10-CM

## 2018-10-19 DIAGNOSIS — T50901A Poisoning by unspecified drugs, medicaments and biological substances, accidental (unintentional), initial encounter: Secondary | ICD-10-CM

## 2018-10-19 DIAGNOSIS — I482 Chronic atrial fibrillation, unspecified: Secondary | ICD-10-CM

## 2018-10-19 DIAGNOSIS — I509 Heart failure, unspecified: Secondary | ICD-10-CM

## 2018-10-19 DIAGNOSIS — R001 Bradycardia, unspecified: Secondary | ICD-10-CM

## 2018-10-19 DIAGNOSIS — R570 Cardiogenic shock: Secondary | ICD-10-CM

## 2018-10-19 DIAGNOSIS — J9601 Acute respiratory failure with hypoxia: Secondary | ICD-10-CM

## 2018-10-19 LAB — BLOOD GAS, ARTERIAL
Acid-base deficit: 4.3 mmol/L — ABNORMAL HIGH (ref 0.0–2.0)
Bicarbonate: 19.8 mmol/L — ABNORMAL LOW (ref 20.0–28.0)
Drawn by: 51155
FIO2: 50
MECHVT: 510 mL
O2 Saturation: 99.3 %
PEEP: 5 cmH2O
Patient temperature: 98.6
RATE: 22 resp/min
pCO2 arterial: 33.6 mmHg (ref 32.0–48.0)
pH, Arterial: 7.388 (ref 7.350–7.450)
pO2, Arterial: 147 mmHg — ABNORMAL HIGH (ref 83.0–108.0)

## 2018-10-19 LAB — BASIC METABOLIC PANEL
Anion gap: 4 — ABNORMAL LOW (ref 5–15)
BUN: 13 mg/dL (ref 8–23)
CO2: 18 mmol/L — ABNORMAL LOW (ref 22–32)
Calcium: 8.1 mg/dL — ABNORMAL LOW (ref 8.9–10.3)
Chloride: 115 mmol/L — ABNORMAL HIGH (ref 98–111)
Creatinine, Ser: 1.4 mg/dL — ABNORMAL HIGH (ref 0.44–1.00)
GFR calc Af Amer: 42 mL/min — ABNORMAL LOW (ref >60)
GFR, EST NON AFRICAN AMERICAN: 36 mL/min — AB (ref >60)
Glucose, Bld: 138 mg/dL — ABNORMAL HIGH (ref 70–99)
Potassium: 3.2 mmol/L — ABNORMAL LOW (ref 3.5–5.1)
SODIUM: 137 mmol/L (ref 135–145)

## 2018-10-19 LAB — GLUCOSE, CAPILLARY
GLUCOSE-CAPILLARY: 132 mg/dL — AB (ref 70–99)
GLUCOSE-CAPILLARY: 50 mg/dL — AB (ref 70–99)
GLUCOSE-CAPILLARY: 81 mg/dL (ref 70–99)
GLUCOSE-CAPILLARY: 77 mg/dL (ref 70–99)
GLUCOSE-CAPILLARY: 126 mg/dL — AB (ref 70–99)
Glucose-Capillary: 122 mg/dL — ABNORMAL HIGH (ref 70–99)
Glucose-Capillary: 125 mg/dL — ABNORMAL HIGH (ref 70–99)
Glucose-Capillary: 141 mg/dL — ABNORMAL HIGH (ref 70–99)
Glucose-Capillary: 144 mg/dL — ABNORMAL HIGH (ref 70–99)
Glucose-Capillary: 160 mg/dL — ABNORMAL HIGH (ref 70–99)
Glucose-Capillary: 165 mg/dL — ABNORMAL HIGH (ref 70–99)
Glucose-Capillary: 178 mg/dL — ABNORMAL HIGH (ref 70–99)
Glucose-Capillary: 195 mg/dL — ABNORMAL HIGH (ref 70–99)
Glucose-Capillary: 234 mg/dL — ABNORMAL HIGH (ref 70–99)
Glucose-Capillary: 247 mg/dL — ABNORMAL HIGH (ref 70–99)
Glucose-Capillary: 40 mg/dL — CL (ref 70–99)
Glucose-Capillary: 66 mg/dL — ABNORMAL LOW (ref 70–99)
Glucose-Capillary: 79 mg/dL (ref 70–99)
Glucose-Capillary: 81 mg/dL (ref 70–99)
Glucose-Capillary: 97 mg/dL (ref 70–99)
Glucose-Capillary: 97 mg/dL (ref 70–99)

## 2018-10-19 LAB — MAGNESIUM
Magnesium: 1.7 mg/dL (ref 1.7–2.4)
Magnesium: 1.4 mg/dL — ABNORMAL LOW (ref 1.7–2.4)

## 2018-10-19 LAB — COMPREHENSIVE METABOLIC PANEL
ALT: 131 U/L — ABNORMAL HIGH (ref 0–44)
AST: 228 U/L — AB (ref 15–41)
Albumin: 2.7 g/dL — ABNORMAL LOW (ref 3.5–5.0)
Alkaline Phosphatase: 97 U/L (ref 38–126)
Anion gap: 8 (ref 5–15)
BUN: 15 mg/dL (ref 8–23)
CO2: 18 mmol/L — ABNORMAL LOW (ref 22–32)
Calcium: 8.6 mg/dL — ABNORMAL LOW (ref 8.9–10.3)
Chloride: 112 mmol/L — ABNORMAL HIGH (ref 98–111)
Creatinine, Ser: 1.65 mg/dL — ABNORMAL HIGH (ref 0.44–1.00)
GFR calc Af Amer: 35 mL/min — ABNORMAL LOW (ref >60)
GFR calc non Af Amer: 30 mL/min — ABNORMAL LOW (ref >60)
Glucose, Bld: 201 mg/dL — ABNORMAL HIGH (ref 70–99)
Potassium: 3 mmol/L — ABNORMAL LOW (ref 3.5–5.1)
Sodium: 138 mmol/L (ref 135–145)
Total Bilirubin: 2.3 mg/dL — ABNORMAL HIGH (ref 0.3–1.2)
Total Protein: 5.6 g/dL — ABNORMAL LOW (ref 6.5–8.1)

## 2018-10-19 LAB — LACTIC ACID, PLASMA: Lactic Acid, Venous: 1.6 mmol/L (ref 0.5–1.9)

## 2018-10-19 LAB — POTASSIUM
POTASSIUM: 3 mmol/L — AB (ref 3.5–5.1)
Potassium: 4 mmol/L (ref 3.5–5.1)

## 2018-10-19 LAB — ECHOCARDIOGRAM COMPLETE
Height: 68 in
Weight: 3008.84 oz

## 2018-10-19 LAB — MRSA PCR SCREENING: MRSA BY PCR: NEGATIVE

## 2018-10-19 LAB — PHOSPHORUS: Phosphorus: 1.9 mg/dL — ABNORMAL LOW (ref 2.5–4.6)

## 2018-10-19 LAB — TSH: TSH: 2.481 u[IU]/mL (ref 0.350–4.500)

## 2018-10-19 MED ORDER — CLONAZEPAM 0.5 MG PO TBDP
0.5000 mg | ORAL_TABLET | Freq: Once | ORAL | Status: AC
  Administered 2018-10-19: 0.5 mg
  Filled 2018-10-19: qty 1

## 2018-10-19 MED ORDER — ORAL CARE MOUTH RINSE: 15.0000 mL | Freq: Two times a day (BID) | OROMUCOSAL | Status: DC

## 2018-10-19 MED ORDER — CLONAZEPAM 0.1 MG/ML ORAL SUSPENSION
0.5000 mg | Freq: Once | ORAL | Status: DC
  Filled 2018-10-19: qty 5

## 2018-10-19 MED ORDER — DEXTROSE 50 % IV SOLN: 50.0000 mL | Freq: Once | INTRAVENOUS | Status: AC

## 2018-10-19 MED ORDER — POTASSIUM CHLORIDE 10 MEQ/50ML IV SOLN
10.0000 meq | INTRAVENOUS | Status: AC
  Administered 2018-10-19 (×2): 10 meq via INTRAVENOUS
  Filled 2018-10-19 (×2): qty 50

## 2018-10-19 MED ORDER — DEXTROSE 10 % IV SOLN
INTRAVENOUS | Status: DC
  Administered 2018-10-19 (×3): via INTRAVENOUS

## 2018-10-19 MED ORDER — POTASSIUM CHLORIDE 10 MEQ/50ML IV SOLN
10.0000 meq | INTRAVENOUS | Status: AC
  Administered 2018-10-19 (×3): 10 meq via INTRAVENOUS
  Filled 2018-10-19 (×3): qty 50

## 2018-10-19 MED ORDER — CHLORHEXIDINE GLUCONATE 0.12% ORAL RINSE (MEDLINE KIT)
15.0000 mL | Freq: Two times a day (BID) | OROMUCOSAL | Status: DC
  Administered 2018-10-19: 15 mL via OROMUCOSAL

## 2018-10-19 MED ORDER — CHLORHEXIDINE GLUCONATE 4 % EX LIQD: 60.0000 mL | Freq: Once | CUTANEOUS | Status: DC

## 2018-10-19 MED ORDER — ORAL CARE MOUTH RINSE
15.0000 mL | Freq: Two times a day (BID) | OROMUCOSAL | Status: DC
  Administered 2018-10-20 – 2018-10-24 (×4): 15 mL via OROMUCOSAL

## 2018-10-19 MED ORDER — EPINEPHRINE PF 1 MG/ML IJ SOLN
0.0000 ug/min | INTRAVENOUS | Status: DC
  Administered 2018-10-19: 5 ug/min via INTRAVENOUS
  Filled 2018-10-19: qty 4

## 2018-10-19 MED ORDER — DEXTROSE 50 % IV SOLN
50.0000 mL | Freq: Once | INTRAVENOUS | Status: AC
  Administered 2018-10-19: 50 mL via INTRAVENOUS
  Filled 2018-10-19: qty 50

## 2018-10-19 MED ORDER — MAGNESIUM SULFATE 2 GM/50ML IV SOLN
2.0000 g | Freq: Once | INTRAVENOUS | Status: AC
  Administered 2018-10-19: 2 g via INTRAVENOUS
  Filled 2018-10-19: qty 50

## 2018-10-19 MED ORDER — ORAL CARE MOUTH RINSE
15.0000 mL | OROMUCOSAL | Status: DC
  Administered 2018-10-19 (×3): 15 mL via OROMUCOSAL

## 2018-10-19 MED ORDER — DEXTROSE 50 % IV SOLN
INTRAVENOUS | Status: AC
  Administered 2018-10-19: 50 mL
  Filled 2018-10-19: qty 50

## 2018-10-19 MED ORDER — CHLORHEXIDINE GLUCONATE 0.12 % MT SOLN
15.0000 mL | Freq: Two times a day (BID) | OROMUCOSAL | Status: DC
  Administered 2018-10-19: 15 mL via OROMUCOSAL

## 2018-10-19 NOTE — Progress Notes (Signed)
1620 - Audie Box, MD notified regarding Blood Glucose of 77. D10 continues to infuse at 50 ml/hr. New orders rec'd - keep blood glucose >80, increase patient's dietary intake. Recheck blood glucse in 1 hour. Physician to be notified if no improvement in blood glucose levels

## 2018-10-19 NOTE — Progress Notes (Signed)
FiO2 dropped to 30% per Pa02 on ABG results.

## 2018-10-19 NOTE — Procedures (Signed)
Extubation Procedure Note  Patient Details:   Name: Lauren Baker DOB: 1940/11/30 MRN: [id number]   Airway Documentation:    Vent end date: (not recorded) Vent end time: (not recorded)   Evaluation  O2 sats: stable throughout Complications: No apparent complications Patient did tolerate procedure well. Bilateral Breath Sounds: Clear, Diminished   Yes   RT extubated patient to 3L [place] with RN at beside per MD order. Positive cuff leak noted. Patient tolerated well and can speak. No stridor noted. RT will continue to monitor as needed.   [name] 10/19/2018, 11:22 AM

## 2018-10-19 NOTE — Progress Notes (Signed)
  Echocardiogram 2D Echocardiogram has been performed.  Padraig Nhan G Creta Dorame 10/19/2018, 1:50 PM

## 2018-10-19 NOTE — Progress Notes (Signed)
DAILY PROGRESS NOTE   Patient Name: Lauren Baker Date of Encounter: 10/19/2018 Cardiologist: Carlyle Dolly, MD  Chief Complaint   Intubated, awake on vent  Patient Profile   78 yo female with recent onset afib - requiring escalating doses of BB and CCB for rate control, presents with cardiogenic shock due to afib with SVR and HR in the 15-20 bpm range - required atropine, TCP, intubation and pressor support.  Subjective   Lauren Baker much better today - BP has improved and she Baker weaned off pressors. She remains in afib, but with HR now in the 80's - holding BB and CCB. On minimal vent settings with possibility of extubation today.  Objective   Vitals:   10/19/18 0745 10/19/18 0800 10/19/18 0815 10/19/18 0828  BP: 120/73 (!) 102/48 122/66   Pulse: 86 87 84   Resp: '20 19 20   ' Temp:   99.2 F (37.3 C)   TempSrc:   Oral   SpO2: 93% 91% 98% 97%  Weight:      Height:        Intake/Output Summary (Last 24 hours) at 10/19/2018 0905 Last data filed at 10/19/2018 6803 Gross per 24 hour  Intake 1128.33 ml  Output 370 ml  Net 758.33 ml   Filed Weights   10/18/18 1949 10/18/18 1952 10/19/18 0114  Weight: 85.3 kg 85.3 kg 85.3 kg    Physical Exam   General appearance: alert, no distress and intubated on vent, follows commands Neck: no carotid bruit, no JVD and thyroid not enlarged, symmetric, no tenderness/mass/nodules Lungs: clear to auscultation bilaterally Heart: irregularly irregular rhythm Abdomen: soft, non-tender; bowel sounds normal; no masses,  no organomegaly Extremities: extremities normal, atraumatic, no cyanosis or edema Pulses: 2+ and symmetric Skin: Skin color, texture, turgor normal. No rashes or lesions Neurologic: Mental status: follows commands, awake on venet Psych: Pleasant  Inpatient Medications    Scheduled Meds: . apixaban  5 mg Oral BID  . buPROPion  100 mg Oral TID  . chlorhexidine gluconate (MEDLINE KIT)  15 mL Mouth Rinse  BID  . cholecalciferol  1,000 Units Oral Daily  . DULoxetine  30 mg Oral Daily  . fentaNYL (SUBLIMAZE) injection  50 mcg Intravenous Once  . mouth rinse  15 mL Mouth Rinse 10 times per day  . QUEtiapine  50 mg Oral Daily    Continuous Infusions: . sodium chloride 10 mL/hr at 10/19/18 0852  . dextrose 50 mL/hr at 10/19/18 0852  . epinephrine 1.5 mcg/min (10/19/18 0852)  . famotidine (PEPCID) IV Stopped (10/18/18 2342)  . fentaNYL infusion INTRAVENOUS 50 mcg/hr (10/19/18 0852)  . magnesium sulfate 1 - 4 g bolus IVPB 50 mL/hr at 10/19/18 0852    PRN Meds: Place/Maintain arterial line **AND** sodium chloride, fentaNYL   Labs   Results for orders placed or performed during the hospital encounter of 10/18/18 (from the past 48 hour(s))  CBC     Status: Abnormal   Collection Time: 10/18/18  5:35 PM  Result Value Ref Range   WBC 12.4 (H) 4.0 - 10.5 K/uL   RBC 4.27 3.87 - 5.11 MIL/uL   Hemoglobin 13.3 12.0 - 15.0 g/dL   HCT 42.7 36.0 - 46.0 %   MCV 100.0 80.0 - 100.0 fL   MCH 31.1 26.0 - 34.0 pg   MCHC 31.1 30.0 - 36.0 g/dL   RDW 13.8 11.5 - 15.5 %   Platelets 180 150 - 400 K/uL   nRBC 0.0 0.0 -  0.2 %    Comment: Performed at [facility], [address], [place], [place] [address]  Troponin I - ONCE - STAT     Status: None   Collection Time: 10/18/18  5:35 PM  Result Value Ref Range   Troponin I <0.03 <0.03 ng/mL    Comment: Performed at [facility], [address], [place], [place] [address]  Protime-INR (order if Patient Baker taking Coumadin / Warfarin)     Status: Abnormal   Collection Time: 10/18/18  5:35 PM  Result Value Ref Range   Prothrombin Time 20.4 (H) 11.4 - 15.2 seconds   INR 1.8 (H) 0.8 - 1.2    Comment: (NOTE) INR goal varies based on device and disease states. Performed at [facility], [address] [place], [place] [address]   Comprehensive metabolic panel     Status: Abnormal   Collection Time: 10/18/18  5:35 PM  Result Value Ref Range   Sodium 137 135 -  145 mmol/L   Potassium 4.2 3.5 - 5.1 mmol/L   Chloride 106 98 - 111 mmol/L   CO2 11 (L) 22 - 32 mmol/L   Glucose, Bld 396 (H) 70 - 99 mg/dL   BUN 15 8 - 23 mg/dL   Creatinine, Ser 1.64 (H) 0.44 - 1.00 mg/dL   Calcium 8.9 8.9 - 10.3 mg/dL   Total Protein 6.0 (L) 6.5 - 8.1 g/dL   Albumin 3.0 (L) 3.5 - 5.0 g/dL   AST 69 (H) 15 - 41 U/L   ALT 51 (H) 0 - 44 U/L   Alkaline Phosphatase 87 38 - 126 U/L   Total Bilirubin 0.9 0.3 - 1.2 mg/dL   GFR calc non Af Amer 30 (L) >60 mL/min   GFR calc Af Amer 35 (L) >60 mL/min   Anion gap 20 (H) 5 - 15    Comment: Performed at [facility], [address], [place], [place] [address]  CBG monitoring, ED     Status: Abnormal   Collection Time: 10/18/18  5:51 PM  Result Value Ref Range   Glucose-Capillary 363 (H) 70 - 99 mg/dL  CBG monitoring, ED     Status: Abnormal   Collection Time: 10/18/18  6:17 PM  Result Value Ref Range   Glucose-Capillary 375 (H) 70 - 99 mg/dL  CBG monitoring, ED     Status: Abnormal   Collection Time: 10/18/18  6:26 PM  Result Value Ref Range   Glucose-Capillary 337 (H) 70 - 99 mg/dL  Blood culture (routine x 2)     Status: None (Preliminary result)   Collection Time: 10/18/18  6:35 PM  Result Value Ref Range   Specimen Description BLOOD RIGHT ANTECUBITAL    Special Requests AEROBIC BOTTLE ONLY Blood Culture adequate volume    Culture      NO GROWTH < 12 HOURS Performed at [facility], [address], [place], [place] [address]    Report Status PENDING   CBG monitoring, ED     Status: Abnormal   Collection Time: 10/18/18  6:37 PM  Result Value Ref Range   Glucose-Capillary 308 (H) 70 - 99 mg/dL  Blood gas, arterial (at [facility])     Status: Abnormal   Collection Time: 10/18/18  6:38 PM  Result Value Ref Range   FIO2 36.00    pH, Arterial 7.214 (L) 7.350 - 7.450   pCO2 arterial 39.3 32.0 - 48.0 mmHg   pO2, Arterial 82.8 (L) 83.0 - 108.0 mmHg   Bicarbonate  15.4 (L) 20.0 - 28.0 mmol/L   Acid-base deficit 11.0 (H)  0.0 - 2.0 mmol/L   O2 Saturation 93.1 %   Patient temperature 37.0    Allens test (pass/fail) PASS PASS    Comment: Performed at [facility], [address] [place], [place] [address]  Lactic acid, plasma     Status: Abnormal   Collection Time: 10/18/18  6:46 PM  Result Value Ref Range   Lactic Acid, Venous 7.3 (HH) 0.5 - 1.9 mmol/L    Comment: CRITICAL RESULT CALLED TO, READ BACK BY AND VERIFIED WITH: DOSS,M ON 10/18/18 AT 1930 BY LOY,C Performed at [facility], [address], [place], [place] [address]   Blood culture (routine x 2)     Status: None (Preliminary result)   Collection Time: 10/18/18  6:46 PM  Result Value Ref Range   Specimen Description BLOOD RIGHT ANTECUBITAL    Special Requests      BOTTLES DRAWN AEROBIC AND ANAEROBIC Blood Culture adequate volume   Culture      NO GROWTH < 12 HOURS Performed at [facility], [address] [place], [place] [address]    Report Status PENDING   Acetaminophen level     Status: Abnormal   Collection Time: 10/18/18  6:59 PM  Result Value Ref Range   Acetaminophen (Tylenol), Serum <10 (L) 10 - 30 ug/mL    Comment: (NOTE) Therapeutic concentrations vary significantly. A range of 10-30 ug/mL  may be an effective concentration for many patients. However, some  are best treated at concentrations outside of this range. Acetaminophen concentrations >150 ug/mL at 4 hours after ingestion  and >50 ug/mL at 12 hours after ingestion are often associated with  toxic reactions. Performed at [facility], [address], [place], [place] [address]   Salicylate level     Status: None   Collection Time: 10/18/18  6:59 PM  Result Value Ref Range   Salicylate Lvl <1.6 2.8 - 30.0 mg/dL    Comment: Performed at [facility], [address], [place], [place] [address]  Beta-hydroxybutyric acid     Status: None   Collection Time: 10/18/18  6:59 PM  Result Value Ref Range   Beta-Hydroxybutyric Acid 0.07 0.05 - 0.27 mmol/L    Comment: Performed at  [facility], [address], [place], [place] [address]  CBG monitoring, ED     Status: Abnormal   Collection Time: 10/18/18  7:17 PM  Result Value Ref Range   Glucose-Capillary 202 (H) 70 - 99 mg/dL  CBG monitoring, ED     Status: Abnormal   Collection Time: 10/18/18  7:40 PM  Result Value Ref Range   Glucose-Capillary 166 (H) 70 - 99 mg/dL  CBG monitoring, ED     Status: None   Collection Time: 10/18/18  8:18 PM  Result Value Ref Range   Glucose-Capillary 96 70 - 99 mg/dL  Comprehensive metabolic panel     Status: Abnormal   Collection Time: 10/18/18  9:21 PM  Result Value Ref Range   Sodium 138 135 - 145 mmol/L   Potassium 3.0 (L) 3.5 - 5.1 mmol/L   Chloride 112 (H) 98 - 111 mmol/L   CO2 18 (L) 22 - 32 mmol/L   Glucose, Bld 201 (H) 70 - 99 mg/dL   BUN 15 8 - 23 mg/dL   Creatinine, Ser 1.65 (H) 0.44 - 1.00 mg/dL   Calcium 8.6 (L) 8.9 - 10.3 mg/dL   Total Protein 5.6 (L) 6.5 - 8.1 g/dL  Albumin 2.7 (L) 3.5 - 5.0 g/dL   AST 228 (H) 15 - 41 U/L   ALT 131 (H) 0 - 44 U/L   Alkaline Phosphatase 97 38 - 126 U/L   Total Bilirubin 2.3 (H) 0.3 - 1.2 mg/dL   GFR calc non Af Amer 30 (L) >60 mL/min   GFR calc Af Amer 35 (L) >60 mL/min   Anion gap 8 5 - 15    Comment: Performed at [facility] [address], [place], [place] [address]  Magnesium     Status: None   Collection Time: 10/18/18  9:21 PM  Result Value Ref Range   Magnesium 1.7 1.7 - 2.4 mg/dL    Comment: Performed at [facility], [place] [address], [place], [place] [address]  Phosphorus     Status: Abnormal   Collection Time: 10/18/18  9:21 PM  Result Value Ref Range   Phosphorus 1.9 (L) 2.5 - 4.6 mg/dL    Comment: Performed at [facility] [address], [place], [place] [address]  Glucose, capillary     Status: None   Collection Time: 10/18/18  9:37 PM  Result Value Ref Range   Glucose-Capillary 98 70 - 99 mg/dL  I-STAT 7, (LYTES, BLD GAS, ICA, H+H)     Status: Abnormal   Collection Time:  10/18/18  9:55 PM  Result Value Ref Range   pH, Arterial 7.247 (L) 7.350 - 7.450   pCO2 arterial 42.3 32.0 - 48.0 mmHg   pO2, Arterial 113.0 (H) 83.0 - 108.0 mmHg   Bicarbonate 18.8 (L) 20.0 - 28.0 mmol/L   TCO2 20 (L) 22 - 32 mmol/L   O2 Saturation 98.0 %   Acid-base deficit 9.0 (H) 0.0 - 2.0 mmol/L   Sodium 143 135 - 145 mmol/L   Potassium 3.0 (L) 3.5 - 5.1 mmol/L   Calcium, Ion 1.27 1.15 - 1.40 mmol/L   HCT 33.0 (L) 36.0 - 46.0 %   Hemoglobin 11.2 (L) 12.0 - 15.0 g/dL   Patient temperature 35.2 C    Collection site ARTERIAL LINE    Drawn by Nurse    Sample type ARTERIAL    Comment NOTIFIED PHYSICIAN   Potassium     Status: Abnormal   Collection Time: 10/18/18 10:02 PM  Result Value Ref Range   Potassium 3.0 (L) 3.5 - 5.1 mmol/L    Comment: Performed at [facility], [address], [place], [place] [address]  Glucose, capillary     Status: Abnormal   Collection Time: 10/18/18 10:06 PM  Result Value Ref Range   Glucose-Capillary 247 (H) 70 - 99 mg/dL  Glucose, capillary     Status: Abnormal   Collection Time: 10/18/18 10:34 PM  Result Value Ref Range   Glucose-Capillary 247 (H) 70 - 99 mg/dL  MRSA PCR Screening     Status: None   Collection Time: 10/18/18 11:02 PM  Result Value Ref Range   MRSA by PCR NEGATIVE NEGATIVE    Comment:        The GeneXpert MRSA Assay (FDA approved for NASAL specimens only), Baker one component of a comprehensive MRSA colonization surveillance program. It Baker not intended to diagnose MRSA infection nor to guide or monitor treatment for MRSA infections. Performed at [facility], [place] [address], [place],  [address]   Glucose, capillary     Status: Abnormal   Collection Time: 10/18/18 11:14 PM  Result Value Ref Range   Glucose-Capillary  234 (H) 70 - 99 mg/dL  TSH     Status: None   Collection Time: 10/18/18 11:17 PM  Result Value Ref Range   TSH 2.481 0.350 - 4.500 uIU/mL    Comment: Performed by a 3rd Generation assay  with a functional sensitivity of <=0.01 uIU/mL. Performed at [facility], [place] [address] [place], [place] [address]   Glucose, capillary     Status: Abnormal   Collection Time: 10/18/18 11:34 PM  Result Value Ref Range   Glucose-Capillary 195 (H) 70 - 99 mg/dL  Glucose, capillary     Status: Abnormal   Collection Time: 10/19/18 12:01 AM  Result Value Ref Range   Glucose-Capillary 178 (H) 70 - 99 mg/dL  Glucose, capillary     Status: Abnormal   Collection Time: 10/19/18 12:30 AM  Result Value Ref Range   Glucose-Capillary 165 (H) 70 - 99 mg/dL  Glucose, capillary     Status: Abnormal   Collection Time: 10/19/18  1:03 AM  Result Value Ref Range   Glucose-Capillary 160 (H) 70 - 99 mg/dL  Glucose, capillary     Status: Abnormal   Collection Time: 10/19/18  1:29 AM  Result Value Ref Range   Glucose-Capillary 132 (H) 70 - 99 mg/dL  Glucose, capillary     Status: None   Collection Time: 10/19/18  2:27 AM  Result Value Ref Range   Glucose-Capillary 79 70 - 99 mg/dL  Lactic acid, plasma     Status: None   Collection Time: 10/19/18  3:15 AM  Result Value Ref Range   Lactic Acid, Venous 1.6 0.5 - 1.9 mmol/L    Comment: Performed at [facility], [place] [address] [place], [place] [address]  Blood gas, arterial     Status: Abnormal   Collection Time: 10/19/18  3:25 AM  Result Value Ref Range   FIO2 50.00    Delivery systems VENTILATOR    Mode PRESSURE REGULATED VOLUME CONTROL    VT 510 mL   LHR 22 resp/min   Peep/cpap 5.0 cm H20   pH, Arterial 7.388 7.350 - 7.450   pCO2 arterial 33.6 32.0 - 48.0 mmHg   pO2, Arterial 147 (H) 83.0 - 108.0 mmHg   Bicarbonate 19.8 (L) 20.0 - 28.0 mmol/L   Acid-base deficit 4.3 (H) 0.0 - 2.0 mmol/L   O2 Saturation 99.3 %   Patient temperature 98.6    Collection site A-LINE    Drawn by [phone]    Sample type ARTERIAL DRAW   Glucose, capillary     Status: Abnormal   Collection Time: 10/19/18  3:26 AM  Result Value Ref Range    Glucose-Capillary 40 (LL) 70 - 99 mg/dL   Comment 1 Call MD NNP PA CNM   Basic metabolic panel     Status: Abnormal   Collection Time: 10/19/18  4:02 AM  Result Value Ref Range   Sodium 137 135 - 145 mmol/L   Potassium 3.2 (L) 3.5 - 5.1 mmol/L   Chloride 115 (H) 98 - 111 mmol/L   CO2 18 (L) 22 - 32 mmol/L   Glucose, Bld 138 (H) 70 - 99 mg/dL   BUN 13 8 - 23 mg/dL   Creatinine, Ser 1.40 (H) 0.44 - 1.00 mg/dL   Calcium 8.1 (L) 8.9 - 10.3 mg/dL   GFR calc non Af Amer 36 (L) >60 mL/min   GFR calc Af Amer 42 (L) >60 mL/min   Anion gap 4 (L) 5 - 15  Comment: Performed at [facility], [place] [address] [place], [place] [address]  Magnesium     Status: Abnormal   Collection Time: 10/19/18  4:02 AM  Result Value Ref Range   Magnesium 1.4 (L) 1.7 - 2.4 mg/dL    Comment: Performed at [facility] [address], [place], [place] [address]  Glucose, capillary     Status: Abnormal   Collection Time: 10/19/18  4:05 AM  Result Value Ref Range   Glucose-Capillary 141 (H) 70 - 99 mg/dL  Glucose, capillary     Status: Abnormal   Collection Time: 10/19/18  5:51 AM  Result Value Ref Range   Glucose-Capillary 50 (L) 70 - 99 mg/dL  Potassium     Status: None   Collection Time: 10/19/18  6:02 AM  Result Value Ref Range   Potassium 4.0 3.5 - 5.1 mmol/L    Comment: Performed at [facility], [place] [address] [place], [place] [address]  Glucose, capillary     Status: Abnormal   Collection Time: 10/19/18  6:55 AM  Result Value Ref Range   Glucose-Capillary 122 (H) 70 - 99 mg/dL  Glucose, capillary     Status: Abnormal   Collection Time: 10/19/18  8:25 AM  Result Value Ref Range   Glucose-Capillary 66 (L) 70 - 99 mg/dL  Glucose, capillary     Status: None   Collection Time: 10/19/18  8:44 AM  Result Value Ref Range   Glucose-Capillary 81 70 - 99 mg/dL    ECG   afib with SVR at 35 - Personally Reviewed  Telemetry   Afib with CVR- Personally Reviewed  Radiology    Dg  Chest Port 1 View  Result Date: 10/18/2018 CLINICAL DATA:  Intubation EXAM: PORTABLE CHEST 1 VIEW COMPARISON:  10/18/2018 FINDINGS: Endotracheal tube tip measures 3.7 cm above the carina. Enteric tube tip Baker off field of view but below the left hemidiaphragm. Shallow inspiration. Cardiac enlargement with mild vascular congestion. Slight interstitial pattern to the lungs may indicate mild edema. No focal consolidation. No blunting of costophrenic angles. No pneumothorax. IMPRESSION: Appliances appear in satisfactory position. Cardiac enlargement with mild vascular congestion and mild interstitial edema. Electronically Signed   By: Lucienne Capers M.D.   On: 10/18/2018 19:53   Dg Chest Port 1 View  Result Date: 10/18/2018 CLINICAL DATA:  Found on the floor, cyanotic. EXAM: PORTABLE CHEST 1 VIEW COMPARISON:  None. FINDINGS: Artifact overlies the chest. Heart size Baker normal. Poor inspiration. Allowing for technical factors, the lungs are clear. No consolidation, collapse or effusion. IMPRESSION: No active disease evident allowing for technical factors. Electronically Signed   By: Nelson Chimes M.D.   On: 10/18/2018 19:18    Cardiac Studies   Echo pending  Assessment   1. Principal Problem: 2.   Bradycardia 3. Active Problems: 4.   Chronic atrial fibrillation 5.   Acute respiratory failure with hypoxia (HCC) 6.   Shock, cardiogenic ([place]) 7.   Accidental overdose 8.   Metabolic acidosis 9.   Plan   1. This Baker a 78 yo female with afib and slow VR complicated by cardiogenic shock - ?medication overdose issue related to patient compliance or rapid medication titration, vs. Combination of conduction disease and medication. Family insists the patient was taking medication as prescribed, despite recent medication changes. Its' not clear to me if she may require a pacemaker or whether this was just related to cumulative high dose mediation effects. Will ask cardiac  EP to consult and provide further  recommendations.  Time Spent Directly with Patient:  I have spent a total of 35 minutes with the patient reviewing hospital notes, telemetry, EKGs, labs and examining the patient as well as establishing an assessment and plan that was discussed personally with the patient.  > 50% of time was spent in direct patient care.  Length of Stay:  LOS: 1 day   Pixie Casino, MD, [facility], [place] Director of the [facility] Lipid Disorders &  Cardiovascular Risk Reduction Clinic Diplomate of the American Board of Clinical Lipidology Attending Cardiologist  Direct Dial: [phone]  Fax: [phone]  Website:  [email or web address] 10/19/2018, 9:05 AM

## 2018-10-19 NOTE — Consult Note (Addendum)
Cardiology Consultation:   Patient ID: KRESTA TEMPLEMAN MRN: [id number]; DOB: 26-May-1941  Admit date: 10/18/2018 Date of Consult: 10/19/2018  Primary Care Provider: Redmond School, MD Primary Cardiologist: Lauren Dolly, MD  Primary Electrophysiologist:  None    Patient Profile:   Lauren Baker is a 78 y.o. 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 Debara Baker.  History of Present Illness:   [name] was initially referred to Lauren Baker Aug 2019 with new finding of AFib, she was unaware of the arrhythmia, and of unknown duration, started on Eliquis and metoprolol increased for HR 116 at the time of her visit then and an ARB for HTN.Subsequently has been following with Dr. Harl Baker. She has subsequently had meds titrated over time and dilt added in effort to gain better rate control There was some mention of dizziness that sounded orthostatic and resolved.  Her last visit was in November with Lauren Baker,  the patient reported no symptoms, better HR control by the patient's account as well.  Her dilt 90TID was changed to CD 318m daily  [facility] pt apparently called family reported she was on the floor and too weak to get up, (by notes) EMS Baker her bradycardic with a heart rate approximately 20 bpm, pale, mottled and slightly hypoxic.  The patient was able to be aroused but was becoming more and more obtunded.   In the ER, there was concern for a possible calcium channel or beta blocker overdose.  Labs were significant for ABG 7.21/39/82, bicarb 11, lactate 7.3, glucose 396, Cr 1.64, troponin negative ER MD noted "She is hyperglycemic but has no history of diabetes, I suspect this is related to insulin resistance secondary to the diltiazem.  [facility] assistance [facility] Dr. FHassell Donehave worked on a  medication regimen to help including intermittent boluses of epinephrine as the patient did not respond to prehospital or emergency department administered atropine.  The patient has improved significantly with intermittent boluses of 0.5 mg of epinephrine however she does have an acute kidney injury and will likely need some fluid resuscitation.  We have done high-dose insulin and she received 85 units of insulin by bolus without significant improvement in her pressure" She had minimal improvement, started on dopamine, externally paced, [facility], transcutaneous pacing held with AFib rates 50's-60's  There was some concern that the patient may have accidentally taken extra of her metoprolol She was on dopamine gtt, Epi gtt, levophed gtt, bicarb.  AS of this morning her gtts have been weaned off, HR maintaining 80's in AFib, BP stable and being weaned off vent.  EP is asked to see for possible tachy-brady  The patient has since been weaned of vent > n/c O2 She reports taking her medicines as prescribed, and doing well.  Her daughter confirms of late with daily BP and HR checks everything has been "good"  They saw her PMD on Friday and report he refilled her medicines.  Initially not noticing that her metoprolol was filled at 561mtabs.  The patient reports in her USO until about 2pm yesterday.  She became very dizzy, weak, recalls so weak she was unable to get up, though denies falling to the floor, and denies syncope.  Recalls the EMS team and ambulance ride.   The family seems to recall or think she had a couple of her 133m tabs of Toprol and the bottle now empty, and that 4 of the 553mtabs missing.  The patient reports she did in-fact take 4 of the 50 tabs to make 20073motal dose that she was taking,  She states that she did not take any other metoprolol pills.   She has not had any kind of CP, exertional intolerances leading onto this  event.  LABS   K+ 4.2 > 3.0 > 3.0 > 3.0 > 3.2 > 4.0 Mag 1.7 > 1.4 (replacement ordered) BUN/Creat 15/1.64 >> 13/1.4 Glucose 396 >> AST 69  ALT 51  Trop I: <0.03  Lactic acid 7.3 >> 1.6 WBC 12.4 H/H 13/42 Plts 180  Home meds included (complete list below): Diltiazem 300m75mily Toprol XL 200mg60mly     Past Medical History:  Diagnosis Date  . Chronic atrial fibrillation   . Hypertension     Past Surgical History:  Procedure Laterality Date  . detatched retina       Home Medications:  Prior to Admission medications   Medication Sig Start Date End Date Taking? Authorizing Provider  buPROPion (WELLBUTRIN) 100 MG tablet Take 100 mg by mouth 3 (three) times daily. 02/26/18  Yes [provider]  cholecalciferol (VITAMIN D) 1000 units tablet Take 1,000 Units by mouth daily.   Yes [provider]  clonazePAM (KLONOPIN) 0.5 MG tablet Take 0.5 mg by mouth 2 (two) times daily. 01/19/18  Yes [provider]  diltiazem (CARDIZEM CD) 300 MG 24 hr capsule Take 1 capsule (300 mg total) by mouth daily. 07/02/18  Yes Strader, [place]  DULoxetine (CYMBALTA) 30 MG capsule Take 30 mg by mouth daily.  12/13/17  Yes [provider]  ELIQUIS 5 MG TABS tablet TAKE 1 TABLET BY MOUTH TWICE A DAY Patient taking differently: Take 5 mg by mouth 2 (two) times daily.  10/13/18  Yes Branch, JonatAlphonse Guild losartan (COZAAR) 50 MG tablet Take 1 tablet (50 mg total) by mouth daily. 08/05/18 11/03/18 Yes Branch, JonAlphonse Guild metoprolol succinate (TOPROL-XL) 100 MG 24 hr tablet Take 2 tablets (200 mg total) by mouth daily. 07/21/18  Yes Branch, JonAlphonse Guild QUEtiapine (SEROQUEL) 50 MG tablet Take 50 mg by mouth at bedtime.  02/26/18  Yes [provider]    Inpatient Medications: Scheduled Meds: . apixaban  5 mg Oral BID  . buPROPion  100 mg Oral TID  . chlorhexidine gluconate (MEDLINE KIT)  15 mL Mouth Rinse BID  . cholecalciferol  1,000 Units Oral  Daily  . DULoxetine  30 mg Oral Daily  . fentaNYL (SUBLIMAZE) injection  50 mcg Intravenous Once  . mouth rinse  15 mL Mouth Rinse 10 times per day  . QUEtiapine  50 mg Oral Daily   Continuous Infusions: . sodium chloride 10 mL/hr at 10/19/18 1000  . dextrose 50 mL/hr at 10/19/18 1000  . epinephrine Stopped (10/19/18 0900)  . famotidine (PEPCID) IV Stopped (10/18/18 2342)  . fentaNYL infusion INTRAVENOUS Stopped (10/19/18 0919)   PRN Meds: Place/Maintain arterial line **AND** sodium chloride, fentaNYL  Allergies:   No Known Allergies  Social History:   Social History   Socioeconomic History  . Marital status: Married    Spouse name: Not on file  . Number of children: Not on file  . Years of education: Not on file  .  Highest education level: Not on file  Occupational History  . Not on file  Social Needs  . Financial resource strain: Not on file  . Food insecurity:    Worry: Not on file    Inability: Not on file  . Transportation needs:    Medical: Not on file    Non-medical: Not on file  Tobacco Use  . Smoking status: Never Smoker  . Smokeless tobacco: Never Used  Substance and Sexual Activity  . Alcohol use: No  . Drug use: No  . Sexual activity: Not on file  Lifestyle  . Physical activity:    Days per week: Not on file    Minutes per session: Not on file  . Stress: Not on file  Relationships  . Social connections:    Talks on phone: Not on file    Gets together: Not on file    Attends religious service: Not on file    Active member of club or organization: Not on file    Attends meetings of clubs or organizations: Not on file    Relationship status: Not on file  . Intimate partner violence:    Fear of current or ex partner: Not on file    Emotionally abused: Not on file    Physically abused: Not on file    Forced sexual activity: Not on file  Other Topics Concern  . Not on file  Social History Narrative  . Not on file    Family History:   Family  History  Problem Relation Age of Onset  . CVA Mother   . Hypertension Sister   . Heart attack Brother   . Heart attack Sister   . Cancer Sister      ROS:  Please see the history of present illness.  All other ROS reviewed and negative.     Physical Exam/Data:   Vitals:   10/19/18 1030 10/19/18 1045 10/19/18 1100 10/19/18 1118  BP: 1'02/68 98/60 99/68 ' (!) 153/132  Pulse: 87 92 89 85  Resp: '17 19 18 19  ' Temp:    98.1 F (36.7 C)  TempSrc:      SpO2: 97% 99% 97% 98%  Weight:      Height:        Intake/Output Summary (Last 24 hours) at 10/19/2018 1232 Last data filed at 10/19/2018 1000 Gross per 24 hour  Intake 1338.99 ml  Output 670 ml  Net 668.99 ml   Last 3 Weights 10/19/2018 10/18/2018 10/18/2018  Weight (lbs) 188 lb 0.8 oz 188 lb 0.8 oz 188 lb 0.8 oz  Weight (kg) 85.3 kg 85.3 kg 85.3 kg     Body mass index is 28.59 kg/m.  General:  Well nourished, well developed, in no acute distress HEENT: normal Lymph: no adenopathy Neck: no JVD Endocrine:  No thryomegaly Vascular: No carotid bruits  Cardiac:  irreg-irreg; no murmurs, gallops or rubs noted Lungs:  CTA b/l,  no wheezing, rhonchi or rales  Abd: soft, nontender, obese Ext: no LE edema Musculoskeletal:  No deformities Skin: warm and dry  Neuro:  She is just extubated this afternoon, is AAO, no gross focal abnormalities noted Psych:  Normal affect   EKG:  The EKG was personally reviewed and demonstrates:    Afib, profound brady 20-30bpm, RBBB AFib, beginning of strip with pause of unknown duration #3 Afib, RBBB, 64bpm Today AFib 59bpm (no RBBB) AFib 73, 'rSr', QRS 74m  Telemetry:  Telemetry was personally reviewed and demonstrates:   Afib 90's  Relevant CV Studies:  03/17/18: TTE Study Conclusions - Left ventricle: The cavity size was normal. There was mild focal   basal hypertrophy of the septum. Systolic function was normal.   The estimated ejection fraction was in the range of 55% to 60%.   Wall  motion was normal; there were no regional wall motion   abnormalities. - Aortic valve: There was mild regurgitation. - Mitral valve: Calcified annulus. There was mild regurgitation. - Left atrium: The atrium was moderately dilated. - Pulmonary arteries: Systolic pressure was mildly increased. Baker   peak pressure: 40 mm Hg (S). Impressions: - Normal LV systolic function; calcified aortic valve with mild AI;   mild MR; moderate LAE; mild TR with mild pulmonary hypertension.  Event Monitor: 05/2018  21 day event monitor  No symptoms reported  Telemetry tracings show afib with variable rates. Some episodes of RVR highest rate up to 150 bpm.  5 beat run of NSVT  Laboratory Data:  Chemistry Recent Labs  Lab 10/18/18 1735 10/18/18 2121 10/18/18 2155 10/18/18 2202 10/19/18 0402 10/19/18 0602  NA 137 138 143  --  137  --   K 4.2 3.0* 3.0* 3.0* 3.2* 4.0  CL 106 112*  --   --  115*  --   CO2 11* 18*  --   --  18*  --   GLUCOSE 396* 201*  --   --  138*  --   BUN 15 15  --   --  13  --   CREATININE 1.64* 1.65*  --   --  1.40*  --   CALCIUM 8.9 8.6*  --   --  8.1*  --   GFRNONAA 30* 30*  --   --  36*  --   GFRAA 35* 35*  --   --  42*  --   ANIONGAP 20* 8  --   --  4*  --     Recent Labs  Lab 10/18/18 1735 10/18/18 2121  PROT 6.0* 5.6*  ALBUMIN 3.0* 2.7*  AST 69* 228*  ALT 51* 131*  ALKPHOS 87 97  BILITOT 0.9 2.3*   Hematology Recent Labs  Lab 10/18/18 1735 10/18/18 2155  WBC 12.4*  --   RBC 4.27  --   HGB 13.3 11.2*  HCT 42.7 33.0*  MCV 100.0  --   MCH 31.1  --   MCHC 31.1  --   RDW 13.8  --   PLT 180  --    Cardiac Enzymes Recent Labs  Lab 10/18/18 1735  TROPONINI <0.03   No results for input(s): TROPIPOC in the last 168 hours.  BNPNo results for input(s): BNP, PROBNP in the last 168 hours.  DDimer No results for input(s): DDIMER in the last 168 hours.  Radiology/Studies:   Dg Chest Port 1 View Result Date: 10/18/2018 CLINICAL DATA:  Intubation EXAM:  PORTABLE CHEST 1 VIEW COMPARISON:  10/18/2018 FINDINGS: Endotracheal tube tip measures 3.7 cm above the carina. Enteric tube tip is off field of view but below the left hemidiaphragm. Shallow inspiration. Cardiac enlargement with mild vascular congestion. Slight interstitial pattern to the lungs may indicate mild edema. No focal consolidation. No blunting of costophrenic angles. No pneumothorax. IMPRESSION: Appliances appear in satisfactory position. Cardiac enlargement with mild vascular congestion and mild interstitial edema. Electronically Signed   By: Lucienne Capers M.D.   On: 10/18/2018 19:53      Assessment and Plan:   1. 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 I discussed we can revisit this tomorrow morning once the patient has had time to give it thought though felt she will need pacing, though barring a compelling discovery of true overdose, feel she will need pacer implant  We discussed PPM implant the rational, the procedure and potential risks/benefits.  The patient is agreeable though she and her family would like to revisit in the AM as well.   I will not have her sign consent today given she was sedated earlier today. v    For questions or updates, please contact [place] Please consult [email or web address] for contact info under     Signed, Baldwin [place], Baker-C  10/19/2018 12:32 PM   I have seen, examined the patient, and reviewed the above assessment and plan.  Changes to above are made where necessary.  On exam, iRRR.  Profound bradycardia on admit, now with AF with RVR.  Her afib  appears to be permanent. I would advise PPM implantation once clinically improved.  Pt extubated today.  Hopefully to telemetry tomorrow.  Remove arterial line/ CVLs. Tentatively scheduled for pacemaker implant (leadless) on Thursday.  Co Sign: Thompson Grayer, MD 10/19/2018 11:41 PM

## 2018-10-19 NOTE — Care Plan (Signed)
Dopamine and Levophed off.  HR still wnl, not requiring any further pacing. LV function still normal on my limited echo.  Will discontinue insulin infusion. Insulin clearance more prolonged with AKI. Continue dextrose with q1h glucose checks for at least next 2 hours, then space. We will keep epinephrine at 5 mcg/min for now. Family at bedside, updated.

## 2018-10-19 NOTE — Progress Notes (Addendum)
NAME:  Lauren Baker, MRN:  [id number], DOB:  [birth year], LOS: 1 ADMISSION DATE:  10/18/2018, CONSULTATION DATE:  10/18/18 REFERRING MD:  AP, CHIEF COMPLAINT:  Bradycardia   Brief History   78 yo w/hx of AF on eliquis presenting with suspected bradycardia of iatrogenesis vs unintentional overdose of both calcium channel and beta blockade. She was transported by EMS to [facility] and subsequently required intubation, pressors, glucagon/ insulin gtt, and tx to [facility] for possible temp wire placement.  Past Medical History  AF HTN Detatched Retina Anxiety   Significant Hospital Events   3/9 Admitted  Consults:  Cards is Primary   Procedures:  3/9 ETT >> 3/9 Foley >> 3/9 R femoral CVL >> 3/9 R radial Aline >>  Significant Diagnostic Tests:  03/2018 TTE  - Normal LV systolic function; calcified aortic valve with mild AI; mild MR; moderate LAE; mild TR with mild pulmonary hypertension.  Micro Data:  3/9 MRSA PCR >> neg 3/9 BCx 2 >>  Antimicrobials:  None  Subjective:  Dopamine and levophed off.  Off insulin gtt- since midnight, ongoing intermittent hypoglycemic episodes, currently on D10 at 50 ml/hr.  No further pacing overnight- Per Card notes limited bedside echo overnight, LV function normal Remains on epi gtt at 5 mcg/min and weaning- remains in AF rates 70-100's She is fully awake, without complaints on fentanyl gtt 50 mcg/hr  Objective   Blood pressure 115/78, pulse 77, temperature 97.7 F (36.5 C), temperature source Axillary, resp. rate (!) 21, height 5\' 8"  (1.727 m), weight 85.3 kg, SpO2 (!) 87 %.    Vent Mode: PRVC FiO2 (%):  [30 %-100 %] 30 % Set Rate:  [16 bmp-22 bmp] 22 bmp Vt Set:  [510 mL] 510 mL PEEP:  [5 cmH20] 5 cmH20 Plateau Pressure:  [16 cmH20-18 cmH20] 17 cmH20   Intake/Output Summary (Last 24 hours) at 10/19/2018 0740 Last data filed at 10/19/2018 0700 Gross per 24 hour  Intake 890.37 ml  Output 370 ml  Net 520.37 ml   Filed Weights   10/18/18  1949 10/18/18 1952 10/19/18 0114  Weight: 85.3 kg 85.3 kg 85.3 kg   Examination: General:  Elderly female on MV/ intubated in NAD, slightly appearing HEENT: MM pink/moist, ETT at 21 cm, pupils 4/reactive, anicteric  Neuro: Fully awake, f/c, MAE CV: IRIR- AF rates reaching above 100 PULM: even/non-labored, lungs bilaterally clear, changed to PSV and doing well on 8/5 GI: obese, soft, non-tender, bs active  Extremities: warm/dry, trace LE edema, R groin site wnl Skin: no rashes   Assessment & Plan:  Unstable bradycardia with shock from presumed accidental BB/ CCB toxicity - off insulin/ glucagon gtt, levophed and dopamine P:  Cards primary  Ongoing tele monitoring Continue Aline and CVL for now- may be able to d/c once off pressors and stable Weaning Epi gtt for HR >60 with MAP > 65 Monitoring electrolytes/ replacement TSH 2.481  Acute Hypoxemic/Hypercapnic Respiratory Failure P: Continue full MV support PRVC 8 cc/kg Doing well currently on SBT, if remains hemodynamically stable will extubate VAP measures  PAD protocol with fentanyl gtt for RASS goal 0/-1  Hypoglycemia - off high dose insulin gtt w/ AKI around midnight P:  Continue to wean D10 gtt for glucose > 80 Hourly CBG's and PRN, till 4 consecutive stable glucose ADDENDUM- notified by RN of ongoing hypoglycemia by fingerstick.  Will check with arterial sample for correlation.  May be a perfusion issue due to pressor use and therefore inacurate.   AKI -  improving AGMA- resolved  Hypokalemia Hypomag P:  S/p K replete, recheck now Mag 2 gm now Trend UOP/ BMP  Chronic AF P:  Per cards Continue eliquis  HTN P:  Holding home diltiazem ER and toprol XL with bradycardia/ shock  Depression/ Anxiety P:  continuing home   Best practice:  Diet: NPO, if for some reason not extubated, start TF Pain/Anxiety/Delirium protocol (if indicated): RASS goal 0/-1   VAP protocol (if indicated): Yes DVT prophylaxis:  eliquis GI prophylaxis: H2B Glucose control: hourly/ D10 gtt Mobility: passive Code Status: full Family Communication: Patient and patient's daughter at bedside updated on plan of care. Disposition: ICU   Labs   CBC: Recent Labs  Lab 10/18/18 1735 10/18/18 2155  WBC 12.4*  --   HGB 13.3 11.2*  HCT 42.7 33.0*  MCV 100.0  --   PLT 180  --     Basic Metabolic Panel: Recent Labs  Lab 10/18/18 1735 10/18/18 2121 10/18/18 2155 10/18/18 2202 10/19/18 0402 10/19/18 0602  NA 137 138 143  --  137  --   K 4.2 3.0* 3.0* 3.0* 3.2* 4.0  CL 106 112*  --   --  115*  --   CO2 11* 18*  --   --  18*  --   GLUCOSE 396* 201*  --   --  138*  --   BUN 15 15  --   --  13  --   CREATININE 1.64* 1.65*  --   --  1.40*  --   CALCIUM 8.9 8.6*  --   --  8.1*  --   MG  --  1.7  --   --  1.4*  --   PHOS  --  1.9*  --   --   --   --    GFR: Estimated Creatinine Clearance: 39.1 mL/min (A) (by C-G formula based on SCr of 1.4 mg/dL (H)). Recent Labs  Lab 10/18/18 1735 10/18/18 1846 10/19/18 0315  WBC 12.4*  --   --   LATICACIDVEN  --  7.3* 1.6    Liver Function Tests: Recent Labs  Lab 10/18/18 1735 10/18/18 2121  AST 69* 228*  ALT 51* 131*  ALKPHOS 87 97  BILITOT 0.9 2.3*  PROT 6.0* 5.6*  ALBUMIN 3.0* 2.7*   No results for input(s): LIPASE, AMYLASE in the last 168 hours. No results for input(s): AMMONIA in the last 168 hours.  ABG    Component Value Date/Time   PHART 7.388 10/19/2018 0325   PCO2ART 33.6 10/19/2018 0325   PO2ART 147 (H) 10/19/2018 0325   HCO3 19.8 (L) 10/19/2018 0325   TCO2 20 (L) 10/18/2018 2155   ACIDBASEDEF 4.3 (H) 10/19/2018 0325   O2SAT 99.3 10/19/2018 0325     Coagulation Profile: Recent Labs  Lab 10/18/18 1735  INR 1.8*    Cardiac Enzymes: Recent Labs  Lab 10/18/18 1735  TROPONINI <0.03    HbA1C: No results found for: HGBA1C  CBG: Recent Labs  Lab 10/19/18 0227 10/19/18 0326 10/19/18 0405 10/19/18 0551 10/19/18 0655  GLUCAP 79  40* 141* 50* 122*     Critical care time: 35 mins   Posey Boyer, MSN, AGACNP-BC [facility] Pgr: [phone] or if no answer [phone] 10/19/2018, 7:40 AM

## 2018-10-19 NOTE — Progress Notes (Signed)
PT Cancellation Note  Patient Details Name: Lauren Baker MRN: [id number] DOB: 06/28/1941   Cancelled Treatment:    Reason Eval/Treat Not Completed: Patient not medically ready(just extubated, will follow for appropriateness)   Fabio Asa 10/19/2018, 1:08 PM

## 2018-10-20 DIAGNOSIS — I495 Sick sinus syndrome: Secondary | ICD-10-CM

## 2018-10-20 LAB — BASIC METABOLIC PANEL
Anion gap: 3 — ABNORMAL LOW (ref 5–15)
BUN: 12 mg/dL (ref 8–23)
CO2: 24 mmol/L (ref 22–32)
Calcium: 8.6 mg/dL — ABNORMAL LOW (ref 8.9–10.3)
Chloride: 112 mmol/L — ABNORMAL HIGH (ref 98–111)
Creatinine, Ser: 1.09 mg/dL — ABNORMAL HIGH (ref 0.44–1.00)
GFR calc non Af Amer: 49 mL/min — ABNORMAL LOW (ref >60)
GFR, EST AFRICAN AMERICAN: 57 mL/min — AB (ref >60)
Glucose, Bld: 165 mg/dL — ABNORMAL HIGH (ref 70–99)
Potassium: 4 mmol/L (ref 3.5–5.1)
Sodium: 139 mmol/L (ref 135–145)

## 2018-10-20 LAB — URINALYSIS, ROUTINE W REFLEX MICROSCOPIC
Bilirubin Urine: NEGATIVE
Glucose, UA: 50 mg/dL — AB
Ketones, ur: NEGATIVE mg/dL
Nitrite: POSITIVE — AB
Protein, ur: NEGATIVE mg/dL
Specific Gravity, Urine: 1.013 (ref 1.005–1.030)
WBC, UA: >50 WBC/hpf — ABNORMAL HIGH (ref 0–5)
pH: 5 (ref 5.0–8.0)

## 2018-10-20 LAB — SURGICAL PCR SCREEN
MRSA, PCR: NEGATIVE
Staphylococcus aureus: POSITIVE — AB

## 2018-10-20 LAB — GLUCOSE, CAPILLARY
Glucose-Capillary: 161 mg/dL — ABNORMAL HIGH (ref 70–99)
Glucose-Capillary: 142 mg/dL — ABNORMAL HIGH (ref 70–99)

## 2018-10-20 MED ORDER — MUPIROCIN 2 % EX OINT
1.0000 "application " | TOPICAL_OINTMENT | Freq: Two times a day (BID) | CUTANEOUS | Status: AC
  Administered 2018-10-20 – 2018-10-25 (×9): 1 via NASAL
  Filled 2018-10-20 (×2): qty 22

## 2018-10-20 MED ORDER — SODIUM CHLORIDE 0.9 % IV SOLN: INTRAVENOUS | Status: DC

## 2018-10-20 MED ORDER — CLONAZEPAM 0.5 MG PO TABS
0.5000 mg | ORAL_TABLET | Freq: Two times a day (BID) | ORAL | Status: DC
  Administered 2018-10-20 – 2018-10-26 (×13): 0.5 mg via ORAL
  Filled 2018-10-20 (×13): qty 1

## 2018-10-20 MED ORDER — SODIUM CHLORIDE 0.45 % IV SOLN: INTRAVENOUS | Status: DC

## 2018-10-20 MED ORDER — ACETAMINOPHEN 325 MG PO TABS
650.0000 mg | ORAL_TABLET | Freq: Four times a day (QID) | ORAL | Status: DC | PRN
  Administered 2018-10-20 – 2018-10-21 (×2): 650 mg via ORAL
  Filled 2018-10-20 (×2): qty 2

## 2018-10-20 MED ORDER — CHLORHEXIDINE GLUCONATE CLOTH 2 % EX PADS
6.0000 | MEDICATED_PAD | Freq: Every day | CUTANEOUS | Status: DC
  Administered 2018-10-21 – 2018-10-24 (×4): 6 via TOPICAL

## 2018-10-20 MED ORDER — QUETIAPINE FUMARATE 50 MG PO TABS
50.0000 mg | ORAL_TABLET | Freq: Every day | ORAL | Status: DC
  Administered 2018-10-20 – 2018-10-25 (×6): 50 mg via ORAL
  Filled 2018-10-20 (×6): qty 1

## 2018-10-20 MED ORDER — CEFAZOLIN SODIUM-DEXTROSE 2-4 GM/100ML-% IV SOLN
2.0000 g | INTRAVENOUS | Status: AC
  Filled 2018-10-20: qty 100

## 2018-10-20 MED ORDER — SODIUM CHLORIDE 0.9 % IV SOLN
1.0000 g | INTRAVENOUS | Status: DC
  Administered 2018-10-20 – 2018-10-23 (×4): 1 g via INTRAVENOUS
  Filled 2018-10-20 (×4): qty 10

## 2018-10-20 NOTE — Evaluation (Addendum)
Physical Therapy Evaluation Patient Details Name: [name] MRN: [id number] DOB: 1940/08/30 Today's Date: 10/20/2018   History of Present Illness  78 y.o. female with a hx of new AFib (Aug 2019) and HTN onlyadmitted with near syncope, profound bradycardia requiring transcutaneous pacing, and intubation for protectino of airway/lethargy transferred from [facility] to [facility]   Clinical Impression  Pt admitted with above diagnosis. Pt currently with functional limitations due to the deficits listed below (see PT Problem List). Pt was able to ambulate with RW with min assist and cues in hallways.  Pt slightly unsteady and may need a RW for home and supervision initailly.  Pt did need 3LO2 to keep sats >90% with ambulation and had to take several standing rest breaks with pursed lip breathing.  Family supportive.  Will follow acutely  Should progress well.  Pt will benefit from skilled PT to increase their independence and safety with mobility to allow discharge to the venue listed below.      Follow Up Recommendations Home health PT;Supervision/Assistance - 24 hour    Equipment Recommendations  Rolling walker with 5" wheels;3in1 (PT)    Recommendations for Other Services       Precautions / Restrictions Precautions Precautions: Fall Restrictions Weight Bearing Restrictions: No      Mobility  Bed Mobility Overal bed mobility: Needs Assistance Bed Mobility: Supine to Sit     Supine to sit: Min guard;Min assist     General bed mobility comments: Pt needed a little assist as she was moving quickly as she was getting up to use 3N1.    Transfers Overall transfer level: Needs assistance Equipment used: 1 person hand held assist Transfers: Sit to/from [employer] Sit to Stand: Min guard Stand pivot transfers: Min guard       General transfer comment: Pt needed min guard assist to stand and to pivot to 3N1.  Slightly impulsive with movemetn.    Ambulation/Gait Ambulation/Gait assistance: Min assist Gait Distance (Feet): 330 Feet Assistive device: Rolling walker (2 wheeled) Gait Pattern/deviations: Step-through pattern;Decreased stride length;Drifts right/left   Gait velocity interpretation: 1.31 - 2.62 ft/sec, indicative of limited community ambulator General Gait Details: Pt ambulated the entire unit with min assist and cues with RW.  Slightly unsteady needing steadying assist.  This is pts first time getting up since admit.   Had to keep O2 in place as pt desat to 80's when removing.  Pt did need 3LO2 to keep sats >90% with ambulation and had to take several standing rest breaks with pursed lip breathing. HR up to 140 bpm. Nurse aware. Practice incentive spirometer.   Stairs            Wheelchair Mobility    Modified Rankin (Stroke Patients Only)       Balance Overall balance assessment: Needs assistance Sitting-balance support: No upper extremity supported;Feet supported Sitting balance-Leahy Scale: Fair     Standing balance support: Bilateral upper extremity supported;During functional activity Standing balance-Leahy Scale: Poor Standing balance comment: Pt relies on RW for support.                              Pertinent Vitals/Pain Pain Assessment: No/denies pain    Home Living Family/patient expects to be discharged to:: Private residence Living Arrangements: Alone Available Help at Discharge: Family;Available 24 hours/day Type of Home: House Home Access: Stairs to enter Entrance Stairs-Rails: None Entrance Stairs-Number of Steps: 1 Home Layout: One level  Home Equipment: None      Prior Function Level of Independence: Independent         Comments: Drove PTA     Hand Dominance        Extremity/Trunk Assessment   Upper Extremity Assessment Upper Extremity Assessment: Defer to OT evaluation    Lower Extremity Assessment Lower Extremity Assessment: Generalized weakness     Cervical / Trunk Assessment Cervical / Trunk Assessment: Normal  Communication   Communication: No difficulties  Cognition Arousal/Alertness: Awake/alert Behavior During Therapy: WFL for tasks assessed/performed Overall Cognitive Status: Within Functional Limits for tasks assessed                                        General Comments      Exercises General Exercises - Lower Extremity Ankle Circles/Pumps: AROM;Both;10 reps;Supine Long Arc Quad: AROM;Both;5 reps;Seated   Assessment/Plan    PT Assessment Patient needs continued PT services  PT Problem List Decreased activity tolerance;Decreased balance;Decreased mobility;Decreased knowledge of precautions;Decreased knowledge of use of DME;Decreased safety awareness       PT Treatment Interventions DME instruction;Gait training;Stair training;Functional mobility training;Therapeutic activities;Therapeutic exercise;Balance training;Patient/family education    PT Goals (Current goals Lauren Baker)  Acute Rehab PT Goals Patient Stated Goal: to go home PT Goal Formulation: With patient Time For Goal Achievement: 11/03/18 Potential to Achieve Goals: Good    Frequency Min 3X/week   Barriers to discharge        Co-evaluation               AM-PAC PT "6 Clicks" Mobility  Outcome Measure Help needed turning from your back to your side while in a flat bed without using bedrails?: None Help needed moving from lying on your back to sitting on the side of a flat bed without using bedrails?: A Little Help needed moving to and from a bed to a chair (including a wheelchair)?: A Little Help needed standing up from a chair using your arms (e.g., wheelchair or bedside chair)?: A Little Help needed to walk in hospital room?: A Little Help needed climbing 3-5 steps with a railing? : A Little 6 Click Score: 19    End of Session Equipment Utilized During Treatment: Gait  belt;Oxygen Activity Tolerance: Patient limited by fatigue Patient left: in chair;with call bell/phone within reach;with chair alarm set;with family/visitor present Nurse Communication: Mobility status PT Visit Diagnosis: Unsteadiness on feet (R26.81);Muscle weakness (generalized) (M62.81)    Time: 0454-0981 PT Time Calculation (min) (ACUTE ONLY): 27 min   Charges:   PT Evaluation $PT Eval Moderate Complexity: 1 Mod PT Treatments $Gait Training: 8-22 mins        Lauren Baker,PT Acute Rehabilitation Services Pager:  [phone]  Office:  [phone]    Lauren Baker 10/20/2018, 1:42 PM

## 2018-10-20 NOTE — Evaluation (Signed)
Clinical/Bedside Swallow Evaluation Patient Details  Name: Lauren Baker MRN: [id number] Date of Birth: [birth year]  Today's Date: 10/20/2018 Time: SLP Start Time (ACUTE ONLY): 0840 SLP Stop Time (ACUTE ONLY): 0855 SLP Time Calculation (min) (ACUTE ONLY): 15 min  Past Medical History:  Past Medical History:  Diagnosis Date  . Chronic atrial fibrillation   . Hypertension    Past Surgical History:  Past Surgical History:  Procedure Laterality Date  . detatched retina     HPI:  Pt is a 78 yo female admitted to [facility] with acute hypoxemic respiratory failure requiring mechanical ventilation (ETT  3/9-3/10) secondary to unstable bradycardic event and shock related to potential accidental calcium channel beta-blockade overdose. Pt also required pressors, transfer to [facility] for possible temp wire placement. CXR with lungs clear on admission. PMH: HTN, afib   Assessment / Plan / Recommendation Clinical Impression  Pt has no overt s/s of aspiration across POs tested, and she denies any prior h/o dysphagia. Her vocal quality sounds clear to SLP. Although her daughter believes it is mildly hoarse, she also acknowledges that it is improved from previous date and near baseline. Intubation was relatively brief. Recommend advancement to regular solids, continuing thin liquids. No acute SLP needs anticipated at this time - will sign off. SLP Visit Diagnosis: Dysphagia, unspecified (R13.10)    Aspiration Risk  Mild aspiration risk    Diet Recommendation Regular;Thin liquid   Liquid Administration via: Cup;Straw Medication Administration: Whole meds with liquid Supervision: Patient able to self feed;Intermittent supervision to cue for compensatory strategies Compensations: Slow rate;Small sips/bites Postural Changes: Seated upright at 90 degrees    Other  Recommendations Oral Care Recommendations: Oral care BID   Follow up Recommendations None      Frequency and Duration             Prognosis Prognosis for Safe Diet Advancement: Good      Swallow Study   General HPI: Pt is a 78 yo female admitted to [facility] with acute hypoxemic respiratory failure requiring mechanical ventilation (ETT  3/9-3/10) secondary to unstable bradycardic event and shock related to potential accidental calcium channel beta-blockade overdose. Pt also required pressors, transfer to [facility] for possible temp wire placement. CXR with lungs clear on admission. PMH: HTN, afib Type of Study: Bedside Swallow Evaluation Previous Swallow Assessment: none in chart Diet Prior to this Study: Thin liquids(clear liquid diet) Temperature Spikes Noted: No Respiratory Status: Nasal cannula History of Recent Intubation: Yes Length of Intubations (days): 1 days Date extubated: 10/19/18 Behavior/Cognition: Alert;Cooperative;Pleasant mood;Requires cueing Oral Cavity Assessment: Within Functional Limits(green on tongue from jello) Oral Care Completed by SLP: No Oral Cavity - Dentition: Edentulous Vision: Functional for self-feeding Self-Feeding Abilities: Able to feed self Patient Positioning: Upright in bed Baseline Vocal Quality: Normal;Other (comment)(dtr says mildly hoarse but near baseline) Volitional Cough: Weak Volitional Swallow: Able to elicit    Oral/Motor/Sensory Function Overall Oral Motor/Sensory Function: Within functional limits   Ice Chips Ice chips: Not tested   Thin Liquid Thin Liquid: Within functional limits Presentation: Cup;Self Fed;Straw    Nectar Thick Nectar Thick Liquid: Not tested   Honey Thick Honey Thick Liquid: Not tested   Puree Puree: Within functional limits Presentation: Self Fed;Spoon   Solid     Solid: Within functional limits Presentation: Self Primus Bravo Ozzie Remmers 10/20/2018,9:01 AM   Ivar Drape, M.A. CCC-SLP Acute Herbalist [phone] Office [phone]

## 2018-10-20 NOTE — Progress Notes (Signed)
Patient stated experiencing dysuria, urinary foley catheter removed 3/10. Paged MD, got orders for UA and urine culture. Patients family worried about possible UTI and having pacemaker placed. MD called back to notify that patient will be placed on IV abx and pacemaker will be placed on Friday. Updated family, they feel better with the patient getting treated for possible infection. Will continue to monitor patient.

## 2018-10-20 NOTE — Progress Notes (Addendum)
Progress Note  Patient Name: Lauren Baker Date of Encounter: 10/20/2018  Primary Cardiologist: Dina Rich, MD   Subjective   OOB eating lunch, feeling much better, a bit emotional about events, and nervous about pacer  Inpatient Medications    Scheduled Meds: . apixaban  5 mg Oral BID  . buPROPion  100 mg Oral TID  . cholecalciferol  1,000 Units Oral Daily  . DULoxetine  30 mg Oral Daily  . fentaNYL (SUBLIMAZE) injection  50 mcg Intravenous Once  . mouth rinse  15 mL Mouth Rinse BID  . QUEtiapine  50 mg Oral QHS   Continuous Infusions: . sodium chloride Stopped (10/20/18 0444)  . dextrose Stopped (10/20/18 0441)  . epinephrine Stopped (10/19/18 0900)  . fentaNYL infusion INTRAVENOUS Stopped (10/19/18 0919)   PRN Meds: Place/Maintain arterial line **AND** sodium chloride, fentaNYL   Vital Signs    Vitals:   10/20/18 0900 10/20/18 1000 10/20/18 1044 10/20/18 1100  BP: 126/85 106/71 (!) 111/91 (!) 127/116  Pulse: (!) 109 91 (!) 143 (!) 135  Resp: 18 19 (!) 39 (!) 21  Temp:      TempSrc:      SpO2: 98% 96% 94% 95%  Weight:      Height:        Intake/Output Summary (Last 24 hours) at 10/20/2018 1136 Last data filed at 10/20/2018 1000 Gross per 24 hour  Intake 1024.16 ml  Output 1850 ml  Net -825.84 ml   Last 3 Weights 10/19/2018 10/18/2018 10/18/2018  Weight (lbs) 188 lb 0.8 oz 188 lb 0.8 oz 188 lb 0.8 oz  Weight (kg) 85.3 kg 85.3 kg 85.3 kg      Telemetry    AFib 100-120's - Personally Reviewed  ECG    No new EKGs - Personally Reviewed  Physical Exam   GEN: No acute distress.   Neck: No JVD Cardiac: irreg-irreg, no murmurs, rubs, or gallops.  Respiratory: CTA b/l. GI: Soft, nontender, non-distended  MS: No edema; No deformity. Neuro:  Nonfocal  Psych: Normal affect   Labs    Chemistry Recent Labs  Lab 10/18/18 1735 10/18/18 2121 10/18/18 2155  10/19/18 0402 10/19/18 0602 10/20/18 0230  NA 137 138 143  --  137  --  139  K 4.2  3.0* 3.0*   < > 3.2* 4.0 4.0  CL 106 112*  --   --  115*  --  112*  CO2 11* 18*  --   --  18*  --  24  GLUCOSE 396* 201*  --   --  138*  --  165*  BUN 15 15  --   --  13  --  12  CREATININE 1.64* 1.65*  --   --  1.40*  --  1.09*  CALCIUM 8.9 8.6*  --   --  8.1*  --  8.6*  PROT 6.0* 5.6*  --   --   --   --   --   ALBUMIN 3.0* 2.7*  --   --   --   --   --   AST 69* 228*  --   --   --   --   --   ALT 51* 131*  --   --   --   --   --   ALKPHOS 87 97  --   --   --   --   --   BILITOT 0.9 2.3*  --   --   --   --   --  GFRNONAA 30* 30*  --   --  36*  --  49*  GFRAA 35* 35*  --   --  42*  --  57*  ANIONGAP 20* 8  --   --  4*  --  3*   < > = values in this interval not displayed.     Hematology Recent Labs  Lab 10/18/18 1735 10/18/18 2155  WBC 12.4*  --   RBC 4.27  --   HGB 13.3 11.2*  HCT 42.7 33.0*  MCV 100.0  --   MCH 31.1  --   MCHC 31.1  --   RDW 13.8  --   PLT 180  --     Cardiac Enzymes Recent Labs  Lab 10/18/18 1735  TROPONINI <0.03   No results for input(s): TROPIPOC in the last 168 hours.   BNPNo results for input(s): BNP, PROBNP in the last 168 hours.   DDimer No results for input(s): DDIMER in the last 168 hours.   Radiology    Dg Chest Port 1 View Result Date: 10/18/2018 CLINICAL DATA:  Intubation EXAM: PORTABLE CHEST 1 VIEW COMPARISON:  10/18/2018 FINDINGS: Endotracheal tube tip measures 3.7 cm above the carina. Enteric tube tip is off field of view but below the left hemidiaphragm. Shallow inspiration. Cardiac enlargement with mild vascular congestion. Slight interstitial pattern to the lungs may indicate mild edema. No focal consolidation. No blunting of costophrenic angles. No pneumothorax. IMPRESSION: Appliances appear in satisfactory position. Cardiac enlargement with mild vascular congestion and mild interstitial edema. Electronically Signed   By: Burman NievesWilliam  Stevens M.D.   On: 10/18/2018 19:53      Cardiac Studies   10/19/2018: TTE IMPRESSIONS  1. The  left ventricle has hyperdynamic systolic function, with an ejection fraction of >65%. The cavity size was normal. There is mildly increased left ventricular wall thickness. Left ventricular diastolic function could not be evaluated secondary to  atrial fibrillation.  2. The right ventricle has normal systolic function. The cavity was normal. There is no increase in right ventricular wall thickness.  3. Left atrial size was mildly dilated.  4. Mild thickening of the mitral valve leaflet.  5. The tricuspid valve is grossly normal.  6. The aortic valve is tricuspid Mild thickening of the aortic valve Aortic valve regurgitation is trivial by color flow Doppler.  7. The inferior vena cava was normal in size with <50% respiratory variability.  8. Vigorous LV systolic function; mild LVH; trace AI; mild MR; mild LAE; mild TR; moderately elevated pulmonary pressure.  FINDINGS  Left Ventricle: The left ventricle has hyperdynamic systolic function, with an ejection fraction of >65%. The cavity size was normal. There is mildly increased left ventricular wall thickness. Left ventricular diastolic function could not be evaluated  secondary to atrial fibrillation. Right Ventricle: The right ventricle has normal systolic function. The cavity was normal. There is no increase in right ventricular wall thickness. Left Atrium: left atrial size was mildly dilated Right Atrium: right atrial size was normal in size. Right atrial pressure is estimated at 8 mmHg.   03/17/18: TTE Study Conclusions - Left ventricle: The cavity size was normal. There was mild focal basal hypertrophy of the septum. Systolic function was normal. The estimated ejection fraction was in the range of 55% to 60%. Wall motion was normal; there were no regional wall motion abnormalities. - Aortic valve: There was mild regurgitation. - Mitral valve: Calcified annulus. There was mild regurgitation. - Left atrium: The atrium was  moderately dilated. -  Pulmonary arteries: Systolic pressure was mildly increased. PA peak pressure: 40 mm Hg (S). Impressions: - Normal LV systolic function; calcified aortic valve with mild AI; mild MR; moderate LAE; mild TR with mild pulmonary hypertension.  Event Monitor: 05/2018  21 day event monitor  No symptoms reported  Telemetry tracings show afib with variable rates. Some episodes of RVR highest rate up to 150 bpm.  5 beat run of NSVT  Patient Profile     78 y.o. female with a hx of new AFib (Aug 2019) and HTN only admitted with near syncope, profound bradycardia requiring transcutaneous pacing, and intubation for protectino of airway/lethargy transferred from [facility] to [facility]   Assessment & Plan    1. Profound and symptomatic bradycardia     Home metoprolol and dilt held     She is s/p numerous medicines/interventions as noted above for concern of perhaps BB/CCB overdose     She extubated yesterday , and has weaned off all of her pressors yesterday and now off all gtts/IVs     She has ambulated this AM  2. Permanent AFib     CHA2DS2Vasc is 4, on Eliquis (will hold tonight/tomorrow)     Rates up today, 100-120's  3. Tachy-Brady     Planned for PPM implant tomorrow     Dr. Johney Frame discussed with patient/family yesterday anticipate leadless device  4. HTN     No changes today     Plan for home meds post pacing       For questions or updates, please contact [facility] Please consult [email or web address] for contact info under        Signed, Sheilah Pigeon, PA-C  10/20/2018, 11:36 AM    I have seen, examined the patient, and reviewed the above assessment and plan.  Changes to above are made where necessary.  On exam, iRRR.  The patient has symptomatic bradycardia. She also has permanent AF with RVR requiring rate control long term.  I would therefore recommend pacemaker implantation at this time.  Risks, benefits, alternatives to leadless pacemaker implantation  were discussed in detail with the patient today. The patient understands that the risks include but are not limited to bleeding, infection, perforation, tamponade, vascular damage, renal failure, MI, stroke, death, and lead dislodgement and wishes to proceed. We will therefore schedule the procedure at the next available time.  We also discussed remote monitoring and its role today. She is tentatively on the schedule for tomorrow.  OK to transfer to telemetry and increase ambulation in the interim. Hold eliquis overnight. Hopefully she can be discharged on Friday.  Hillis Range MD, [facility] 10/20/2018 10:11 PM

## 2018-10-20 NOTE — Progress Notes (Signed)
Notified MD about PCR  positive for staphylococcus aureus. Initiated staphylococcus aureus positive standing orders. Will continue to monitor patient.

## 2018-10-20 NOTE — Evaluation (Signed)
Occupational Therapy Evaluation Patient Details Name: Lauren Baker MRN: [id number] DOB: Mar 13, 1941 Today's Date: 10/20/2018    History of Present Illness 78 y.o. female with a hx of new AFib (Aug 2019) and HTN onlyadmitted with near syncope, profound bradycardia requiring transcutaneous pacing, and intubation for protectino of airway/lethargy transferred from [facility] to [facility]    Clinical Impression   PTA pt lives alone and is mod I for ADL and transfers. Pt today was min A to mod A for bed mobility (more assist back to bed). Pt able to don/doff socks EOB and perform grooming tasks with set up - HR elevated to 160 sitting EOB and so returned supine. Sons present throughout. OT will continue to follow acutely. At this time recommending HHOT to perform safety evaluation as well as work on ADL and IADL in the home environment as Pt lives alone and typically cooks and cleans in addition to self-care    Follow Up Recommendations  Home health OT;Supervision/Assistance - 24 hour    Equipment Recommendations  None recommended by OT    Recommendations for Other Services       Precautions / Restrictions Precautions Precautions: Fall Restrictions Weight Bearing Restrictions: No      Mobility Bed Mobility Overal bed mobility: Needs Assistance Bed Mobility: Supine to Sit;Sit to Supine     Supine to sit: Min assist;HOB elevated Sit to supine: Mod assist   General bed mobility comments: min A for hips EOB, mod A for BLE back into bed  Transfers                 General transfer comment: Pt HR elevated to 160 sitting EOB, returned to supine    Balance Overall balance assessment: Needs assistance Sitting-balance support: No upper extremity supported;Feet supported Sitting balance-Leahy Scale: Fair                                     ADL either performed or assessed with clinical judgement   ADL Overall ADL's : Needs assistance/impaired Eating/Feeding: Modified  independent;Bed level   Grooming: Set up;Sitting;Wash/dry hands;Wash/dry face Grooming Details (indicate cue type and reason): EOB Upper Body Bathing: Min guard;Sitting   Lower Body Bathing: Min guard;Sitting/lateral leans   Upper Body Dressing : Set up;Sitting   Lower Body Dressing: Moderate assistance;Sit to/from stand   Toilet Transfer: Minimal assistance;Stand-pivot;BSC   Toileting- Clothing Manipulation and Hygiene: Minimal assistance;Sit to/from stand       Functional mobility during ADLs: Minimal assistance(SPT only) General ADL Comments: HR elevated to 160 sitting EOB     Vision Patient Visual Report: No change from baseline       Perception     Praxis      Pertinent Vitals/Pain Pain Assessment: No/denies pain     Hand Dominance Right   Extremity/Trunk Assessment Upper Extremity Assessment Upper Extremity Assessment: Generalized weakness   Lower Extremity Assessment Lower Extremity Assessment: Defer to PT evaluation   Cervical / Trunk Assessment Cervical / Trunk Assessment: Normal   Communication Communication Communication: No difficulties   Cognition Arousal/Alertness: Awake/alert Behavior During Therapy: WFL for tasks assessed/performed Overall Cognitive Status: Within Functional Limits for tasks assessed                                     General Comments  2 sons present throughout session, HR  elevated to 160 sitting EOB    Exercises     Shoulder Instructions      Home Living Family/patient expects to be discharged to:: Private residence Living Arrangements: Alone Available Help at Discharge: Family;Available 24 hours/day(3 adult sons and an adult daughter) Type of Home: House Home Access: Stairs to enter Secretary/administrator of Steps: 1 Entrance Stairs-Rails: None Home Layout: One level     Bathroom Shower/Tub: Chief Strategy Officer: Standard     Home Equipment: Shower seat;Hand held shower head           Prior Functioning/Environment Level of Independence: Independent        Comments: Drove PTA        OT Problem List: Decreased activity tolerance;Impaired balance (sitting and/or standing);Decreased knowledge of use of DME or AE;Decreased knowledge of precautions;Cardiopulmonary status limiting activity      OT Treatment/Interventions: Self-care/ADL training;DME and/or AE instruction;Therapeutic activities;Patient/family education;Balance training    OT Goals(Current goals can be found in the care plan section) Acute Rehab OT Goals Patient Stated Goal: to go home OT Goal Formulation: With patient/family Time For Goal Achievement: 11/03/18 Potential to Achieve Goals: Good ADL Goals Pt Will Perform Grooming: with supervision;standing Pt Will Perform Upper Body Dressing: with modified independence;sitting Pt Will Perform Lower Body Dressing: with supervision;sit to/from stand Pt Will Transfer to Toilet: with modified independence;ambulating  OT Frequency: Min 2X/week   Barriers to D/C:            Co-evaluation              AM-PAC OT "6 Clicks" Daily Activity     Outcome Measure Help from another person eating meals?: None Help from another person taking care of personal grooming?: A Little Help from another person toileting, which includes using toliet, bedpan, or urinal?: A Little Help from another person bathing (including washing, rinsing, drying)?: A Little Help from another person to put on and taking off regular upper body clothing?: A Little Help from another person to put on and taking off regular lower body clothing?: A Lot 6 Click Score: 18   End of Session Equipment Utilized During Treatment: Oxygen Nurse Communication: Mobility status  Activity Tolerance: Patient tolerated treatment well Patient left: in bed;with call bell/phone within reach;with family/visitor present  OT Visit Diagnosis: Unsteadiness on feet (R26.81);Other abnormalities of  gait and mobility (R26.89);Muscle weakness (generalized) (M62.81)                Time: 7619-5093 OT Time Calculation (min): 16 min Charges:  OT General Charges $OT Visit: 1 Visit OT Evaluation $OT Eval Moderate Complexity: 1 Mod  Sherryl Manges OTR/L Acute Rehabilitation Services Pager: [phone] Office: [phone]  [name] Brentyn Seehafer 10/20/2018, 5:25 PM

## 2018-10-21 ENCOUNTER — Encounter (HOSPITAL_COMMUNITY)
Admission: EM | Disposition: A | Discharge status: Home / Self Care | Payer: Self-pay | Source: Home / Self Care | Attending: Internal Medicine

## 2018-10-21 DIAGNOSIS — T83511A Infection and inflammatory reaction due to indwelling urethral catheter, initial encounter: Secondary | ICD-10-CM

## 2018-10-21 DIAGNOSIS — N390 Urinary tract infection, site not specified: Secondary | ICD-10-CM

## 2018-10-21 LAB — CBC WITH DIFFERENTIAL/PLATELET
Abs Immature Granulocytes: 0.01 10*3/uL (ref 0.00–0.07)
Basophils Absolute: 0 10*3/uL (ref 0.0–0.1)
Basophils Relative: 1 %
Eosinophils Absolute: 0.1 10*3/uL (ref 0.0–0.5)
Eosinophils Relative: 2 %
HCT: 32.1 % — ABNORMAL LOW (ref 36.0–46.0)
Hemoglobin: 10.5 g/dL — ABNORMAL LOW (ref 12.0–15.0)
IMMATURE GRANULOCYTES: 0 %
Lymphocytes Relative: 17 %
Lymphs Abs: 1 10*3/uL (ref 0.7–4.0)
MCH: 30.7 pg (ref 26.0–34.0)
MCHC: 32.7 g/dL (ref 30.0–36.0)
MCV: 93.9 fL (ref 80.0–100.0)
MONOS PCT: 11 %
Monocytes Absolute: 0.6 10*3/uL (ref 0.1–1.0)
Neutro Abs: 4 10*3/uL (ref 1.7–7.7)
Neutrophils Relative %: 69 %
Platelets: 108 10*3/uL — ABNORMAL LOW (ref 150–400)
RBC: 3.42 MIL/uL — ABNORMAL LOW (ref 3.87–5.11)
RDW: 13.6 % (ref 11.5–15.5)
WBC: 5.8 10*3/uL (ref 4.0–10.5)
nRBC: 0 % (ref 0.0–0.2)

## 2018-10-21 LAB — BASIC METABOLIC PANEL
Anion gap: 7 (ref 5–15)
BUN: 11 mg/dL (ref 8–23)
CO2: 25 mmol/L (ref 22–32)
Calcium: 8.8 mg/dL — ABNORMAL LOW (ref 8.9–10.3)
Chloride: 109 mmol/L (ref 98–111)
Creatinine, Ser: 1.01 mg/dL — ABNORMAL HIGH (ref 0.44–1.00)
GFR calc Af Amer: >60 mL/min (ref >60)
GFR, EST NON AFRICAN AMERICAN: 54 mL/min — AB (ref >60)
Glucose, Bld: 119 mg/dL — ABNORMAL HIGH (ref 70–99)
POTASSIUM: 3.6 mmol/L (ref 3.5–5.1)
Sodium: 141 mmol/L (ref 135–145)

## 2018-10-21 SURGERY — INVASIVE LAB ABORTED CASE

## 2018-10-21 MED ORDER — METOPROLOL TARTRATE 25 MG PO TABS
37.5000 mg | ORAL_TABLET | Freq: Four times a day (QID) | ORAL | Status: DC
  Administered 2018-10-21 – 2018-10-22 (×3): 37.5 mg via ORAL
  Filled 2018-10-21 (×3): qty 1

## 2018-10-21 MED ORDER — METOPROLOL TARTRATE 25 MG PO TABS
25.0000 mg | ORAL_TABLET | Freq: Four times a day (QID) | ORAL | Status: DC
  Administered 2018-10-21: 25 mg via ORAL

## 2018-10-21 MED ORDER — APIXABAN 5 MG PO TABS
5.0000 mg | ORAL_TABLET | Freq: Two times a day (BID) | ORAL | Status: AC
  Administered 2018-10-21 – 2018-10-24 (×6): 5 mg via ORAL
  Filled 2018-10-21 (×6): qty 1

## 2018-10-21 MED ORDER — METOPROLOL TARTRATE 25 MG PO TABS
ORAL_TABLET | ORAL | Status: AC
  Filled 2018-10-21: qty 1

## 2018-10-21 MED FILL — Epinephrine PF Soln Prefilled Syringe 1 MG/10ML (0.1 MG/ML): INTRAMUSCULAR | Qty: 30 | Status: AC

## 2018-10-21 MED FILL — Epinephrine PF Soln Prefilled Syringe 1 MG/10ML (0.1 MG/ML): INTRAMUSCULAR | Qty: 10 | Status: AC

## 2018-10-21 SURGICAL SUPPLY — 3 items
CABLE SURGICAL S-101-97-12 (CABLE) ×4 IMPLANT
PAD PRO RADIOLUCENT 2001M-C (PAD) ×4 IMPLANT
TRAY PACEMAKER INSERTION (PACKS) ×4 IMPLANT

## 2018-10-21 NOTE — Care Management Important Message (Signed)
Important Message  Patient Details  Name: Lauren Baker MRN: [id number] Date of Birth: 05/08/1941   Medicare Important Message Given:  Yes    Oralia Rud Azari Janssens 10/21/2018, 3:05 PM

## 2018-10-21 NOTE — Progress Notes (Addendum)
Progress Note  Patient Name: Lauren Baker Date of Encounter: 10/21/2018  Primary Cardiologist: Dina Rich, MD   Subjective   C/w some dysuria this AM, urine is foul smelling, otherwise doing OK, no CP or SOB  Inpatient Medications    Scheduled Meds: . buPROPion  100 mg Oral TID  . Chlorhexidine Gluconate Cloth  6 each Topical Daily  . cholecalciferol  1,000 Units Oral Daily  . clonazePAM  0.5 mg Oral BID  . DULoxetine  30 mg Oral Daily  . fentaNYL (SUBLIMAZE) injection  50 mcg Intravenous Once  . mouth rinse  15 mL Mouth Rinse BID  . metoprolol tartrate  25 mg Oral Q6H  . mupirocin ointment  1 application Nasal BID  . QUEtiapine  50 mg Oral QHS   Continuous Infusions: . sodium chloride    . sodium chloride Stopped (10/20/18 0444)  . sodium chloride    .  ceFAZolin (ANCEF) IV    . cefTRIAXone (ROCEPHIN)  IV 1 g (10/20/18 2343)  . dextrose Stopped (10/20/18 0441)  . epinephrine Stopped (10/19/18 0900)  . fentaNYL infusion INTRAVENOUS Stopped (10/19/18 0919)   PRN Meds: Place/Maintain arterial line **AND** sodium chloride, acetaminophen, fentaNYL   Vital Signs    Vitals:   10/20/18 2024 10/20/18 2228 10/21/18 0537 10/21/18 0832  BP: (!) 149/118  121/84 (!) 128/93  Pulse:   86 (!) 114  Resp: (!) 22 (!) 22  20  Temp:   99.1 F (37.3 C) 98.2 F (36.8 C)  TempSrc:    Oral  SpO2:   100% 95%  Weight:   92.2 kg   Height:        Intake/Output Summary (Last 24 hours) at 10/21/2018 1009 Last data filed at 10/20/2018 2000 Gross per 24 hour  Intake 240 ml  Output 300 ml  Net -60 ml   Last 3 Weights 10/21/2018 10/19/2018 10/18/2018  Weight (lbs) 203 lb 4.8 oz 188 lb 0.8 oz 188 lb 0.8 oz  Weight (kg) 92.216 kg 85.3 kg 85.3 kg      Telemetry    AFib 100-120's - Personally Reviewed  ECG    No new EKGs - Personally Reviewed  Physical Exam   GEN: No acute distress.   Neck: No JVD Cardiac: irreg-irreg, tachycardic, no murmurs, rubs, or gallops.   Respiratory: CTA b/l. GI: Soft, nontender, non-distended  MS: No edema; No deformity. Neuro:  Nonfocal  Psych: Normal affect   Labs    Chemistry Recent Labs  Lab 10/18/18 1735 10/18/18 2121  10/19/18 0402 10/19/18 0602 10/20/18 0230 10/21/18 0554  NA 137 138   < > 137  --  139 141  K 4.2 3.0*   < > 3.2* 4.0 4.0 3.6  CL 106 112*  --  115*  --  112* 109  CO2 11* 18*  --  18*  --  24 25  GLUCOSE 396* 201*  --  138*  --  165* 119*  BUN 15 15  --  13  --  12 11  CREATININE 1.64* 1.65*  --  1.40*  --  1.09* 1.01*  CALCIUM 8.9 8.6*  --  8.1*  --  8.6* 8.8*  PROT 6.0* 5.6*  --   --   --   --   --   ALBUMIN 3.0* 2.7*  --   --   --   --   --   AST 69* 228*  --   --   --   --   --  ALT 51* 131*  --   --   --   --   --   ALKPHOS 87 97  --   --   --   --   --   BILITOT 0.9 2.3*  --   --   --   --   --   GFRNONAA 30* 30*  --  36*  --  49* 54*  GFRAA 35* 35*  --  42*  --  57* >60  ANIONGAP 20* 8  --  4*  --  3* 7   < > = values in this interval not displayed.     Hematology Recent Labs  Lab 10/18/18 1735 10/18/18 2155 10/21/18 0554  WBC 12.4*  --  5.8  RBC 4.27  --  3.42*  HGB 13.3 11.2* 10.5*  HCT 42.7 33.0* 32.1*  MCV 100.0  --  93.9  MCH 31.1  --  30.7  MCHC 31.1  --  32.7  RDW 13.8  --  13.6  PLT 180  --  108*    Cardiac Enzymes Recent Labs  Lab 10/18/18 1735  TROPONINI <0.03   No results for input(s): TROPIPOC in the last 168 hours.   BNPNo results for input(s): BNP, PROBNP in the last 168 hours.   DDimer No results for input(s): DDIMER in the last 168 hours.   Radiology    Dg Chest Port 1 View Result Date: 10/18/2018 CLINICAL DATA:  Intubation EXAM: PORTABLE CHEST 1 VIEW COMPARISON:  10/18/2018 FINDINGS: Endotracheal tube tip measures 3.7 cm above the carina. Enteric tube tip is off field of view but below the left hemidiaphragm. Shallow inspiration. Cardiac enlargement with mild vascular congestion. Slight interstitial pattern to the lungs may indicate  mild edema. No focal consolidation. No blunting of costophrenic angles. No pneumothorax. IMPRESSION: Appliances appear in satisfactory position. Cardiac enlargement with mild vascular congestion and mild interstitial edema. Electronically Signed   By: Burman Nieves M.D.   On: 10/18/2018 19:53      Cardiac Studies   10/19/2018: TTE IMPRESSIONS  1. The left ventricle has hyperdynamic systolic function, with an ejection fraction of >65%. The cavity size was normal. There is mildly increased left ventricular wall thickness. Left ventricular diastolic function could not be evaluated secondary to  atrial fibrillation.  2. The right ventricle has normal systolic function. The cavity was normal. There is no increase in right ventricular wall thickness.  3. Left atrial size was mildly dilated.  4. Mild thickening of the mitral valve leaflet.  5. The tricuspid valve is grossly normal.  6. The aortic valve is tricuspid Mild thickening of the aortic valve Aortic valve regurgitation is trivial by color flow Doppler.  7. The inferior vena cava was normal in size with <50% respiratory variability.  8. Vigorous LV systolic function; mild LVH; trace AI; mild MR; mild LAE; mild TR; moderately elevated pulmonary pressure.  FINDINGS  Left Ventricle: The left ventricle has hyperdynamic systolic function, with an ejection fraction of >65%. The cavity size was normal. There is mildly increased left ventricular wall thickness. Left ventricular diastolic function could not be evaluated  secondary to atrial fibrillation. Right Ventricle: The right ventricle has normal systolic function. The cavity was normal. There is no increase in right ventricular wall thickness. Left Atrium: left atrial size was mildly dilated Right Atrium: right atrial size was normal in size. Right atrial pressure is estimated at 8 mmHg.   03/17/18: TTE Study Conclusions - Left ventricle: The cavity size was  normal. There was mild focal  basal hypertrophy of the septum. Systolic function was normal. The estimated ejection fraction was in the range of 55% to 60%. Wall motion was normal; there were no regional wall motion abnormalities. - Aortic valve: There was mild regurgitation. - Mitral valve: Calcified annulus. There was mild regurgitation. - Left atrium: The atrium was moderately dilated. - Pulmonary arteries: Systolic pressure was mildly increased. PA peak pressure: 40 mm Hg (S). Impressions: - Normal LV systolic function; calcified aortic valve with mild AI; mild MR; moderate LAE; mild TR with mild pulmonary hypertension.  Event Monitor: 05/2018  21 day event monitor  No symptoms reported  Telemetry tracings show afib with variable rates. Some episodes of RVR highest rate up to 150 bpm.  5 beat run of NSVT  Patient Profile     78 y.o. female with a hx of new AFib (Aug 2019) and HTN only admitted with near syncope, profound bradycardia requiring transcutaneous pacing, and intubation for protectino of airway/lethargy transferred from [facility] to [facility]   Assessment & Plan    1. Profound and symptomatic bradycardia     Home metoprolol and dilt held     She is s/p numerous medicines/interventions as noted above for concern of perhaps BB/CCB overdose     She extubated yesterday , and has weaned off all of her pressors yesterday and now off all gtts/IVs     She has ambulated this AM  2. Permanent AFib     Onset of her afib Aug 2019, in review of record, looks like patient had declined DCCV, without symptoms of her AF, and maintained on rate control strategy      CHA2DS2Vasc is 4, on Eliquis (will hold for pacer)     Rates up today, 120's-130's      Will start lopressor  3. Tachy-Brady     Planned tentatively  for PPM implant tomorrow      4. HTN     Looks OK  5. Dysuria     Abnormal UA c/w UTI, culture pending     Antibiotic started last night     WBC is wnl     Tmax 99.1     Pt denies any  overt symptoms of illness, no chills, malaise     Will hold off pacing today     Discussed with family at bedside       For questions or updates, please contact [facility] Please consult [email or web address] for contact info under        Signed, Sheilah Pigeon, PA-C  10/21/2018, 10:09 AM    I have seen, examined the patient, and reviewed the above assessment and plan.  Changes to above are made where necessary.  On exam, irregular tachycardia.  Given UTI, I would not advise PPM today.  We will reevaluate tomorrow am.  NPO after midnight just incase.  Awaiting urine culture.  On antibiotics.  Co Sign: Hillis Range, MD 10/21/2018 11:50 AM

## 2018-10-22 ENCOUNTER — Encounter (HOSPITAL_COMMUNITY): Payer: Self-pay | Admitting: Anesthesiology

## 2018-10-22 ENCOUNTER — Other Ambulatory Visit: Payer: Self-pay

## 2018-10-22 ENCOUNTER — Encounter (HOSPITAL_COMMUNITY): Payer: Self-pay | Admitting: *Deleted

## 2018-10-22 DIAGNOSIS — T83511D Infection and inflammatory reaction due to indwelling urethral catheter, subsequent encounter: Secondary | ICD-10-CM

## 2018-10-22 LAB — CBC
HCT: 32 % — ABNORMAL LOW (ref 36.0–46.0)
Hemoglobin: 10.6 g/dL — ABNORMAL LOW (ref 12.0–15.0)
MCH: 31 pg (ref 26.0–34.0)
MCHC: 33.1 g/dL (ref 30.0–36.0)
MCV: 93.6 fL (ref 80.0–100.0)
Platelets: 118 10*3/uL — ABNORMAL LOW (ref 150–400)
RBC: 3.42 MIL/uL — ABNORMAL LOW (ref 3.87–5.11)
RDW: 13.6 % (ref 11.5–15.5)
WBC: 5.6 10*3/uL (ref 4.0–10.5)
nRBC: 0 % (ref 0.0–0.2)

## 2018-10-22 LAB — BASIC METABOLIC PANEL
Anion gap: 9 (ref 5–15)
BUN: 9 mg/dL (ref 8–23)
CALCIUM: 9.2 mg/dL (ref 8.9–10.3)
CO2: 26 mmol/L (ref 22–32)
Chloride: 107 mmol/L (ref 98–111)
Creatinine, Ser: 0.99 mg/dL (ref 0.44–1.00)
GFR calc Af Amer: >60 mL/min (ref >60)
GFR calc non Af Amer: 55 mL/min — ABNORMAL LOW (ref >60)
Glucose, Bld: 118 mg/dL — ABNORMAL HIGH (ref 70–99)
Potassium: 3.5 mmol/L (ref 3.5–5.1)
Sodium: 142 mmol/L (ref 135–145)

## 2018-10-22 MED ORDER — METOPROLOL TARTRATE 50 MG PO TABS
50.0000 mg | ORAL_TABLET | Freq: Four times a day (QID) | ORAL | Status: DC
  Administered 2018-10-22 – 2018-10-23 (×4): 50 mg via ORAL
  Filled 2018-10-22 (×4): qty 1

## 2018-10-22 NOTE — Progress Notes (Addendum)
Electrophysiology Rounding Note  Patient Name: Lauren Baker Date of Encounter: 10/22/2018  Primary Cardiologist: Wyline Mood Electrophysiologist: Hilding Quintanar   Subjective   The patient is doing well today.  At this time, the patient denies chest pain, shortness of breath, or any new concerns.  V rates still elevated.   Inpatient Medications    Scheduled Meds: . apixaban  5 mg Oral BID  . buPROPion  100 mg Oral TID  . Chlorhexidine Gluconate Cloth  6 each Topical Daily  . cholecalciferol  1,000 Units Oral Daily  . clonazePAM  0.5 mg Oral BID  . DULoxetine  30 mg Oral Daily  . fentaNYL (SUBLIMAZE) injection  50 mcg Intravenous Once  . mouth rinse  15 mL Mouth Rinse BID  . metoprolol tartrate  50 mg Oral Q6H  . mupirocin ointment  1 application Nasal BID  . QUEtiapine  50 mg Oral QHS   Continuous Infusions: . sodium chloride    . sodium chloride Stopped (10/20/18 0444)  . sodium chloride    . cefTRIAXone (ROCEPHIN)  IV 1 g (10/21/18 2146)  . dextrose Stopped (10/20/18 0441)  . epinephrine Stopped (10/19/18 0900)  . fentaNYL infusion INTRAVENOUS Stopped (10/19/18 0919)   PRN Meds: Place/Maintain arterial line **AND** sodium chloride, acetaminophen, fentaNYL   Vital Signs    Vitals:   10/22/18 0519 10/22/18 0520 10/22/18 0900 10/22/18 1210  BP: 138/89 138/89  (!) 134/104  Pulse: (!) 34 (!) 116  (!) 111  Resp:      Temp: 98.5 F (36.9 C)  98.5 F (36.9 C)   TempSrc: Oral  Oral   SpO2: (!) 89%     Weight:      Height:        Intake/Output Summary (Last 24 hours) at 10/22/2018 1246 Last data filed at 10/22/2018 4098 Gross per 24 hour  Intake 240 ml  Output -  Net 240 ml   Filed Weights   10/18/18 1952 10/19/18 0114 10/21/18 0537  Weight: 85.3 kg 85.3 kg 92.2 kg    Physical Exam    GEN- The patient is well appearing, alert and oriented x 3 today.   Head- normocephalic, atraumatic Eyes-  Sclera clear, conjunctiva pink Ears- hearing intact Oropharynx- clear  Neck- supple Lungs- Clear to ausculation bilaterally, normal work of breathing Heart- Tachycardic irregular rate and rhythm  GI- soft, NT, ND, + BS Extremities- no clubbing, cyanosis, or edema Skin- no rash or lesion Psych- euthymic mood, full affect Neuro- strength and sensation are intact  Labs    CBC Recent Labs    10/21/18 0554 10/22/18 0406  WBC 5.8 5.6  NEUTROABS 4.0  --   HGB 10.5* 10.6*  HCT 32.1* 32.0*  MCV 93.9 93.6  PLT 108* 118*   Basic Metabolic Panel Recent Labs    11/91/47 0554 10/22/18 0406  NA 141 142  K 3.6 3.5  CL 109 107  CO2 25 26  GLUCOSE 119* 118*  BUN 11 9  CREATININE 1.01* 0.99  CALCIUM 8.8* 9.2     Telemetry    AF, V rates 120-140's (personally reviewed)  Radiology    No results found.   Patient Profile     78 y.o. female with a hx of new AFib (Aug 2019) and HTN onlyadmitted with near syncope, profound bradycardia requiring transcutaneous pacing, and intubation for protectino of airway/lethargy transferred from [facility] to [facility]   Assessment & Plan    1.  Symptomatic bradycardia Plan for PPM implant after UTI  clears Working on scheduling for Monday. With permanent AF, plan for MICRA  2.  Permanent AF Continue Eliquis for CHADS2VASC of 4 If able to schedule MICRA for Monday, hold Eliquis Sunday night Continue Metoprolol (will consolidate to 100mg  bid starting tomorrow)  3.  UTI Culture pending Continue Rocephin  Low grade fever last night   For questions or updates, please contact [facility] Please consult [email or web address] for contact info under Cardiology/STEMI.  Signed, Gypsy Balsam, NP  10/22/2018, 12:46 PM    /I have seen, examined the patient, and reviewed the above assessment and plan.  Changes to above are made where necessary.  On exam,  iRRR.  Pt with tachycardia/ bradycardia syndrome.  Awaiting leadless pacemaker implantation once UTI is resolved.  She has been scheduled for the procedure on Monday am.  Please  make NPO after midnight on Sunday.  Continue eliquis in the interim, holding eliquis Sunday pm and Monday am doses only. Continue to titrate metoprolol over the weekend.  Very complicated patient.  A high level of decision making was required for this encounter.  Co Sign: Hillis Range, MD 10/22/2018 10:39 PM

## 2018-10-22 NOTE — Progress Notes (Signed)
OT Cancellation Note  Patient Details Name: Lauren Baker MRN: [id number] DOB: Mar 23, 1941   Cancelled Treatment:    Reason Eval/Treat Not Completed: Patient at procedure or test/ unavailable(elevated HR, set to go for pacemaker today). OT will hold until after sx and continue acute services then.   [name] Virgil Slinger 10/22/2018, 1:01 PM  Sherryl Manges OTR/L Acute Rehabilitation Services Pager: [phone] Office: [phone]

## 2018-10-23 LAB — URINE CULTURE: Culture: >=100000 — AB

## 2018-10-23 LAB — HEPATIC FUNCTION PANEL
ALT: 38 U/L (ref 0–44)
AST: 30 U/L (ref 15–41)
Albumin: 2.4 g/dL — ABNORMAL LOW (ref 3.5–5.0)
Alkaline Phosphatase: 73 U/L (ref 38–126)
Bilirubin, Direct: 0.3 mg/dL — ABNORMAL HIGH (ref 0.0–0.2)
Indirect Bilirubin: 0.6 mg/dL (ref 0.3–0.9)
Total Bilirubin: 0.9 mg/dL (ref 0.3–1.2)
Total Protein: 5.6 g/dL — ABNORMAL LOW (ref 6.5–8.1)

## 2018-10-23 LAB — BASIC METABOLIC PANEL
Anion gap: 6 (ref 5–15)
BUN: 9 mg/dL (ref 8–23)
CO2: 29 mmol/L (ref 22–32)
CREATININE: 0.84 mg/dL (ref 0.44–1.00)
Calcium: 9.1 mg/dL (ref 8.9–10.3)
Chloride: 105 mmol/L (ref 98–111)
GFR calc non Af Amer: >60 mL/min (ref >60)
Glucose, Bld: 101 mg/dL — ABNORMAL HIGH (ref 70–99)
Potassium: 3.3 mmol/L — ABNORMAL LOW (ref 3.5–5.1)
Sodium: 140 mmol/L (ref 135–145)

## 2018-10-23 LAB — CULTURE, BLOOD (ROUTINE X 2)
CULTURE: NO GROWTH
Culture: NO GROWTH
Special Requests: ADEQUATE
Special Requests: ADEQUATE

## 2018-10-23 MED ORDER — POTASSIUM CHLORIDE CRYS ER 20 MEQ PO TBCR
40.0000 meq | EXTENDED_RELEASE_TABLET | Freq: Once | ORAL | Status: AC
  Administered 2018-10-23: 40 meq via ORAL
  Filled 2018-10-23: qty 2

## 2018-10-23 MED ORDER — FUROSEMIDE 10 MG/ML IJ SOLN
20.0000 mg | Freq: Two times a day (BID) | INTRAMUSCULAR | Status: AC
  Administered 2018-10-23: 20 mg via INTRAVENOUS
  Filled 2018-10-23: qty 2

## 2018-10-23 MED ORDER — METOPROLOL TARTRATE 50 MG PO TABS
75.0000 mg | ORAL_TABLET | Freq: Four times a day (QID) | ORAL | Status: DC
  Administered 2018-10-23 – 2018-10-25 (×8): 75 mg via ORAL
  Filled 2018-10-23 (×8): qty 1

## 2018-10-23 NOTE — Progress Notes (Addendum)
Electrophysiology Rounding Note  Patient Name: Lauren Baker Date of Encounter: 10/23/2018  Primary Cardiologist: Wyline Mood Electrophysiologist: Allred   Subjective   feeling pretty good with scant palps Ambulating in room   Inpatient Medications    Scheduled Meds: . apixaban  5 mg Oral BID  . buPROPion  100 mg Oral TID  . Chlorhexidine Gluconate Cloth  6 each Topical Daily  . cholecalciferol  1,000 Units Oral Daily  . clonazePAM  0.5 mg Oral BID  . DULoxetine  30 mg Oral Daily  . fentaNYL (SUBLIMAZE) injection  50 mcg Intravenous Once  . mouth rinse  15 mL Mouth Rinse BID  . metoprolol tartrate  50 mg Oral Q6H  . mupirocin ointment  1 application Nasal BID  . QUEtiapine  50 mg Oral QHS   Continuous Infusions: . sodium chloride    . sodium chloride Stopped (10/20/18 0444)  . sodium chloride    . cefTRIAXone (ROCEPHIN)  IV 1 g (10/22/18 2148)  . dextrose Stopped (10/20/18 0441)  . epinephrine Stopped (10/19/18 0900)  . fentaNYL infusion INTRAVENOUS Stopped (10/19/18 0919)   PRN Meds: Place/Maintain arterial line **AND** sodium chloride, acetaminophen, fentaNYL   Vital Signs    Vitals:   10/22/18 2106 10/23/18 0043 10/23/18 0637 10/23/18 0643  BP: (!) 156/99 133/78 (!) 141/91   Pulse: 70 (!) 112 (!) 105 (!) 132  Resp: 18  18   Temp: 98.8 F (37.1 C)  98.6 F (37 C)   TempSrc: Oral  Oral   SpO2: 90%  93%   Weight:   91.8 kg   Height:        Intake/Output Summary (Last 24 hours) at 10/23/2018 0841 Last data filed at 10/22/2018 1500 Gross per 24 hour  Intake 240 ml  Output 750 ml  Net -510 ml   Filed Weights   10/19/18 0114 10/21/18 0537 10/23/18 0637  Weight: 85.3 kg 92.2 kg 91.8 kg    Physical Exam    Well developed and nourished in no acute distress HENT normal Neck supple with JVP-8 Crackles Irregularly irregular rate and rhythm with rapid ventricular response, no murmurs or gallops Abd-soft with active BS  No Clubbing cyanosis tr edema  Skin-warm and dry A & Oriented  Grossly normal sensory and motor function   Labs    CBC Recent Labs    10/21/18 0554 10/22/18 0406  WBC 5.8 5.6  NEUTROABS 4.0  --   HGB 10.5* 10.6*  HCT 32.1* 32.0*  MCV 93.9 93.6  PLT 108* 118*   Basic Metabolic Panel Recent Labs    24/26/83 0406 10/23/18 0259  NA 142 140  K 3.5 3.3*  CL 107 105  CO2 26 29  GLUCOSE 118* 101*  BUN 9 9  CREATININE 0.99 0.84  CALCIUM 9.2 9.1     Telemetry    Telemetry Personally reviewed  AF rates 110-150s  Radiology    No results found.   Patient Profile     78 y.o. female with a hx of new AFib (Aug 2019) and HTN onlyadmitted with near syncope, profound bradycardia requiring transcutaneous pacing, and intubation for protectino of airway/lethargy transferred from [facility] to [facility]   Assessment & Plan    1.  Symptomatic bradycardia   2.  Permanent AF RVR    3.  UTI  HTN  Hypokalemia   HFpEF acute     Diuretic dose x 1 furosemide 20 mg iv  wll continue apixoban for now Will need orders for  pacemaker on Monday   Will increase rate control but gingerly as slow rates are the bigger issue acutely compared to tachy for the next 48 hrs  Will need K repletion

## 2018-10-23 NOTE — Progress Notes (Signed)
Physical Therapy Treatment Patient Details Name: Lauren Baker MRN: [id number] DOB: 16-Sep-1940 Today's Date: 10/23/2018    History of Present Illness 78 y.o. female with a hx of new AFib (Aug 2019) and HTN onlyadmitted with near syncope, profound bradycardia requiring transcutaneous pacing, and intubation for protectino of airway/lethargy transferred from [facility] to [facility]     PT Comments    Patient seen for activity progression, tolerated session well despite elevated HR. Current POC remains appropriate.   Follow Up Recommendations  Home health PT;Supervision/Assistance - 24 hour     Equipment Recommendations  Rolling walker with 5" wheels;3in1 (PT)    Recommendations for Other Services       Precautions / Restrictions Precautions Precautions: Fall Restrictions Weight Bearing Restrictions: No    Mobility  Bed Mobility Overal bed mobility: Needs Assistance Bed Mobility: Supine to Sit     Supine to sit: Min guard;HOB elevated        Transfers Overall transfer level: Needs assistance Equipment used: Rolling walker (2 wheeled) Transfers: Sit to/from [employer] Sit to Stand: Min guard         General transfer comment: Min guard for safety, Vcs for hand placement  Ambulation/Gait Ambulation/Gait assistance: Min assist Gait Distance (Feet): 240 Feet Assistive device: Rolling walker (2 wheeled) Gait Pattern/deviations: Step-through pattern;Decreased stride length;Drifts right/left Gait velocity: decreased Gait velocity interpretation: 1.31 - 2.62 ft/sec, indicative of limited community ambulator General Gait Details: Hr elevations to upper 130s, modest instability intially but improved with distance   Stairs             Wheelchair Mobility    Modified Rankin (Stroke Patients Only)       Balance Overall balance assessment: Needs assistance Sitting-balance support: No upper extremity supported;Feet supported Sitting balance-Leahy  Scale: Fair     Standing balance support: Bilateral upper extremity supported;During functional activity Standing balance-Leahy Scale: Poor Standing balance comment: Pt relies on RW for support.                             Cognition Arousal/Alertness: Awake/alert Behavior During Therapy: WFL for tasks assessed/performed Overall Cognitive Status: Within Functional Limits for tasks assessed                                        Exercises General Exercises - Lower Extremity Ankle Circles/Pumps: AROM;Both;10 reps;Supine Long Arc Quad: AROM;Both;5 reps;Seated    General Comments        Pertinent Vitals/Pain Pain Assessment: No/denies pain    Home Living                      Prior Function            PT Goals (current goals can now be found in the care plan section) Acute Rehab PT Goals Patient Stated Goal: to go home PT Goal Formulation: With patient Time For Goal Achievement: 11/03/18 Potential to Achieve Goals: Good Progress towards PT goals: Progressing toward goals    Frequency    Min 3X/week      PT Plan Current plan remains appropriate    Co-evaluation              AM-PAC PT "6 Clicks" Mobility   Outcome Measure  Help needed turning from your back to your side while in a flat bed without using  bedrails?: None Help needed moving from lying on your back to sitting on the side of a flat bed without using bedrails?: A Little Help needed moving to and from a bed to a chair (including a wheelchair)?: A Little Help needed standing up from a chair using your arms (e.g., wheelchair or bedside chair)?: A Little Help needed to walk in hospital room?: A Little Help needed climbing 3-5 steps with a railing? : A Little 6 Click Score: 19    End of Session Equipment Utilized During Treatment: Gait belt;Oxygen Activity Tolerance: Patient limited by fatigue Patient left: in chair;with call bell/phone within reach;with chair  alarm set;with family/visitor present Nurse Communication: Mobility status PT Visit Diagnosis: Unsteadiness on feet (R26.81);Muscle weakness (generalized) (M62.81)     Time: 5027-7412 PT Time Calculation (min) (ACUTE ONLY): 19 min  Charges:  $Gait Training: 8-22 mins                     Charlotte Crumb, PT DPT  Board Certified Neurologic Specialist Acute Rehabilitation Services Pager [phone] Office [phone]    Fabio Asa 10/23/2018, 1:47 PM

## 2018-10-24 LAB — BASIC METABOLIC PANEL
Anion gap: 11 (ref 5–15)
BUN: 9 mg/dL (ref 8–23)
CO2: 28 mmol/L (ref 22–32)
CREATININE: 1 mg/dL (ref 0.44–1.00)
Calcium: 9.2 mg/dL (ref 8.9–10.3)
Chloride: 102 mmol/L (ref 98–111)
GFR calc Af Amer: >60 mL/min (ref >60)
GFR calc non Af Amer: 55 mL/min — ABNORMAL LOW (ref >60)
Glucose, Bld: 104 mg/dL — ABNORMAL HIGH (ref 70–99)
Potassium: 3.6 mmol/L (ref 3.5–5.1)
Sodium: 141 mmol/L (ref 135–145)

## 2018-10-24 MED ORDER — DILTIAZEM HCL ER COATED BEADS 120 MG PO CP24
120.0000 mg | ORAL_CAPSULE | Freq: Every day | ORAL | Status: DC
  Administered 2018-10-24: 120 mg via ORAL
  Filled 2018-10-24: qty 1

## 2018-10-24 MED ORDER — CEFAZOLIN SODIUM-DEXTROSE 2-4 GM/100ML-% IV SOLN
2.0000 g | INTRAVENOUS | Status: AC
  Administered 2018-10-25: 2 g via INTRAVENOUS
  Filled 2018-10-24: qty 100

## 2018-10-24 MED ORDER — SODIUM CHLORIDE 0.9 % IV SOLN: INTRAVENOUS | Status: DC

## 2018-10-24 MED ORDER — SODIUM CHLORIDE 0.9 % IV SOLN
80.0000 mg | INTRAVENOUS | Status: AC
  Administered 2018-10-25: 80 mg
  Filled 2018-10-24: qty 2

## 2018-10-24 NOTE — Anesthesia Preprocedure Evaluation (Deleted)
Anesthesia Evaluation    Reviewed: Allergy & Precautions, Patient's Chart, lab work & pertinent test results, reviewed documented beta blocker date and time   Airway        Dental   Pulmonary neg pulmonary ROS,           Cardiovascular hypertension, Pt. on home beta blockers and Pt. on medications + dysrhythmias (on eliquis) Atrial Fibrillation   TTE 10/2018 EF >65%, valves normal  Carotid Doppler 2016 1. Minimal amount of bilateral intimal wall thickening and atherosclerotic plaque, right subjectively greater than left, not resulting in hemodynamically significant stenosis. 2. Patient was noted to be hypertensive at the time of the carotid Doppler ultrasound.   Neuro/Psych PSYCHIATRIC DISORDERS Anxiety negative neurological ROS     GI/Hepatic negative GI ROS, Neg liver ROS,   Endo/Other  negative endocrine ROS  Renal/GU negative Renal ROS  negative genitourinary   Musculoskeletal negative musculoskeletal ROS (+)   Abdominal   Peds  Hematology negative hematology ROS (+)   Anesthesia Other Findings Pacemaker insertion for bradycardia  Reproductive/Obstetrics                            Anesthesia Physical Anesthesia Plan  ASA: II  Anesthesia Plan: General   Post-op Pain Management:    Induction: Intravenous  PONV Risk Score and Plan: 3 and Ondansetron, Dexamethasone and Treatment may vary due to age or medical condition  Airway Management Planned: Oral ETT  Additional Equipment:   Intra-op Plan:   Post-operative Plan: Extubation in OR  Informed Consent: I have reviewed the patients History and Physical, chart, labs and discussed the procedure including the risks, benefits and alternatives for the proposed anesthesia with the patient or authorized representative who has indicated his/her understanding and acceptance.     Dental advisory given  Plan Discussed with:  CRNA  Anesthesia Plan Comments:         Anesthesia Quick Evaluation

## 2018-10-24 NOTE — Progress Notes (Signed)
Electrophysiology Rounding Note  Patient Name: Lauren Baker Date of Encounter: 10/24/2018  Primary Cardiologist: Wyline Mood Electrophysiologist: Allred   Subjective  Breathing some better No chest pain    Inpatient Medications    Scheduled Meds: . apixaban  5 mg Oral BID  . buPROPion  100 mg Oral TID  . Chlorhexidine Gluconate Cloth  6 each Topical Daily  . cholecalciferol  1,000 Units Oral Daily  . clonazePAM  0.5 mg Oral BID  . DULoxetine  30 mg Oral Daily  . fentaNYL (SUBLIMAZE) injection  50 mcg Intravenous Once  . mouth rinse  15 mL Mouth Rinse BID  . metoprolol tartrate  75 mg Oral Q6H  . mupirocin ointment  1 application Nasal BID  . QUEtiapine  50 mg Oral QHS   Continuous Infusions: . sodium chloride    . sodium chloride Stopped (10/20/18 0444)  . sodium chloride    . cefTRIAXone (ROCEPHIN)  IV 1 g (10/23/18 2146)  . dextrose Stopped (10/20/18 0441)  . epinephrine Stopped (10/19/18 0900)  . fentaNYL infusion INTRAVENOUS Stopped (10/19/18 0919)   PRN Meds: Place/Maintain arterial line **AND** sodium chloride, acetaminophen, fentaNYL   Vital Signs    Vitals:   10/23/18 1900 10/23/18 2136 10/24/18 0019 10/24/18 0607  BP:  (!) 144/99 108/67 (!) 143/98  Pulse: 66 93 (!) 118 (!) 131  Resp:    18  Temp:  98.8 F (37.1 C)  98 F (36.7 C)  TempSrc:  Oral  Oral  SpO2: 97% 98%  100%  Weight:    87.2 kg  Height:        Intake/Output Summary (Last 24 hours) at 10/24/2018 0750 Last data filed at 10/24/2018 0650 Gross per 24 hour  Intake 1200 ml  Output 800 ml  Net 400 ml   Filed Weights   10/21/18 0537 10/23/18 0637 10/24/18 0607  Weight: 92.2 kg 91.8 kg 87.2 kg    Physical Exam    Well developed and nourished in no acute distress HENT normal Neck supple  Clear Irregularly irregular rate and rhythm withrapid ventricular response, no murmurs or gallops Abd-soft with active BS without hepatomegaly No Clubbing cyanosis edema Skin-warm and dry A &  Oriented  Grossly normal sensory and motor function   Labs    CBC Recent Labs    10/22/18 0406  WBC 5.6  HGB 10.6*  HCT 32.0*  MCV 93.6  PLT 118*   Basic Metabolic Panel Recent Labs    71/24/58 0259 10/24/18 0426  NA 140 141  K 3.3* 3.6  CL 105 102  CO2 29 28  GLUCOSE 101* 104*  BUN 9 9  CREATININE 0.84 1.00  CALCIUM 9.1 9.2     Telemetry    Telemetry Personally reviewed  AFib 110-150s  Radiology    No results found.   Patient Profile     78 y.o. female with a hx of new AFib (Aug 2019) and HTN onlyadmitted with near syncope, profound bradycardia requiring transcutaneous pacing, and intubation for protectino of airway/lethargy transferred from [facility] to [facility]   Echo EF 65%  Assessment & Plan    Symptomatic bradycardia   Permanent AF RVR    UTI   HTN  Hypokalemia resolved   HFpEF acute   For PM in am--leadless per JA  Will discuss with him as rate control looks like might be a challenge and AV ablation may be necessary  apixoban ordered to STOP after this am in anticipation of the procedure  Will NEED TO BE REORDERED   Continue metoprolol over night   Urine culture E Coli >>cefazolin sensitive so will stop ceftriaxone

## 2018-10-25 ENCOUNTER — Encounter (HOSPITAL_COMMUNITY)
Admission: EM | Disposition: A | Discharge status: Home / Self Care | Payer: Self-pay | Source: Home / Self Care | Attending: Internal Medicine

## 2018-10-25 ENCOUNTER — Encounter (HOSPITAL_COMMUNITY): Payer: Self-pay | Admitting: Internal Medicine

## 2018-10-25 HISTORY — PX: PACEMAKER IMPLANT: EP1218

## 2018-10-25 LAB — BASIC METABOLIC PANEL
Anion gap: 6 (ref 5–15)
BUN: 12 mg/dL (ref 8–23)
CO2: 28 mmol/L (ref 22–32)
Calcium: 9.2 mg/dL (ref 8.9–10.3)
Chloride: 105 mmol/L (ref 98–111)
Creatinine, Ser: 0.92 mg/dL (ref 0.44–1.00)
GFR calc non Af Amer: >60 mL/min (ref >60)
Glucose, Bld: 112 mg/dL — ABNORMAL HIGH (ref 70–99)
Potassium: 3.6 mmol/L (ref 3.5–5.1)
Sodium: 139 mmol/L (ref 135–145)

## 2018-10-25 SURGERY — PACEMAKER IMPLANT

## 2018-10-25 MED ORDER — DILTIAZEM HCL ER COATED BEADS 240 MG PO CP24
240.0000 mg | ORAL_CAPSULE | Freq: Every day | ORAL | Status: DC
  Administered 2018-10-25 – 2018-10-26 (×2): 240 mg via ORAL
  Filled 2018-10-25 (×2): qty 1

## 2018-10-25 MED ORDER — SODIUM CHLORIDE 0.9% FLUSH: 3.0000 mL | Freq: Two times a day (BID) | INTRAVENOUS | Status: DC

## 2018-10-25 MED ORDER — SODIUM CHLORIDE 0.9% FLUSH: 3.0000 mL | INTRAVENOUS | Status: DC | PRN

## 2018-10-25 MED ORDER — IOHEXOL 350 MG/ML SOLN
INTRAVENOUS | Status: DC | PRN
  Administered 2018-10-25: 15 mL via INTRAVENOUS

## 2018-10-25 MED ORDER — MIDAZOLAM HCL 5 MG/5ML IJ SOLN
INTRAMUSCULAR | Status: DC | PRN
  Administered 2018-10-25: 1 mg via INTRAVENOUS

## 2018-10-25 MED ORDER — SODIUM CHLORIDE 0.9 % IV SOLN
INTRAVENOUS | Status: DC
  Administered 2018-10-25: 06:00:00 via INTRAVENOUS

## 2018-10-25 MED ORDER — HEPARIN (PORCINE) IN NACL 1000-0.9 UT/500ML-% IV SOLN
INTRAVENOUS | Status: DC | PRN
  Administered 2018-10-25: 500 mL

## 2018-10-25 MED ORDER — CEFAZOLIN SODIUM-DEXTROSE 1-4 GM/50ML-% IV SOLN
1.0000 g | Freq: Four times a day (QID) | INTRAVENOUS | Status: AC
  Administered 2018-10-25 – 2018-10-26 (×3): 1 g via INTRAVENOUS
  Filled 2018-10-25 (×3): qty 50

## 2018-10-25 MED ORDER — MIDAZOLAM HCL 5 MG/5ML IJ SOLN
INTRAMUSCULAR | Status: AC
  Filled 2018-10-25: qty 5

## 2018-10-25 MED ORDER — LIDOCAINE HCL (PF) 1 % IJ SOLN
INTRAMUSCULAR | Status: DC | PRN
  Administered 2018-10-25: 50 mL

## 2018-10-25 MED ORDER — METOPROLOL SUCCINATE ER 100 MG PO TB24
200.0000 mg | ORAL_TABLET | Freq: Every day | ORAL | Status: DC
  Administered 2018-10-25 – 2018-10-26 (×2): 200 mg via ORAL
  Filled 2018-10-25 (×3): qty 2

## 2018-10-25 MED ORDER — CEFAZOLIN SODIUM-DEXTROSE 2-4 GM/100ML-% IV SOLN
INTRAVENOUS | Status: AC
  Filled 2018-10-25: qty 100

## 2018-10-25 MED ORDER — SODIUM CHLORIDE 0.9 % IV SOLN
INTRAVENOUS | Status: AC
  Filled 2018-10-25: qty 2

## 2018-10-25 MED ORDER — LIDOCAINE HCL (PF) 1 % IJ SOLN
INTRAMUSCULAR | Status: AC
  Filled 2018-10-25: qty 30

## 2018-10-25 MED ORDER — HYDROCODONE-ACETAMINOPHEN 5-325 MG PO TABS: 1.0000 | ORAL_TABLET | ORAL | Status: DC | PRN

## 2018-10-25 MED ORDER — ACETAMINOPHEN 325 MG PO TABS
325.0000 mg | ORAL_TABLET | ORAL | Status: DC | PRN
  Administered 2018-10-26: 650 mg via ORAL
  Filled 2018-10-25: qty 2

## 2018-10-25 MED ORDER — SODIUM CHLORIDE 0.9 % IV SOLN: INTRAVENOUS | Status: DC

## 2018-10-25 MED ORDER — LIDOCAINE HCL (PF) 1 % IJ SOLN
INTRAMUSCULAR | Status: AC
  Filled 2018-10-25: qty 60

## 2018-10-25 MED ORDER — HEPARIN (PORCINE) IN NACL 1000-0.9 UT/500ML-% IV SOLN
INTRAVENOUS | Status: AC
  Filled 2018-10-25: qty 500

## 2018-10-25 MED ORDER — ONDANSETRON HCL 4 MG/2ML IJ SOLN: 4.0000 mg | Freq: Four times a day (QID) | INTRAMUSCULAR | Status: DC | PRN

## 2018-10-25 MED ORDER — SODIUM CHLORIDE 0.9 % IV SOLN: 250.0000 mL | INTRAVENOUS | Status: DC | PRN

## 2018-10-25 SURGICAL SUPPLY — 7 items
CABLE SURGICAL S-101-97-12 (CABLE) ×2 IMPLANT
IPG PACE AZUR XT SR MRI W1SR01 (Pacemaker) ×1 IMPLANT
LEAD CAPSURE NOVUS 5076-58CM (Lead) ×2 IMPLANT
PACE AZURE XT SR MRI W1SR01 (Pacemaker) ×2 IMPLANT
PAD PRO RADIOLUCENT 2001M-C (PAD) ×2 IMPLANT
SHEATH CLASSIC 7F (SHEATH) ×2 IMPLANT
TRAY PACEMAKER INSERTION (PACKS) ×2 IMPLANT

## 2018-10-25 NOTE — Discharge Summary (Addendum)
ELECTROPHYSIOLOGY PROCEDURE DISCHARGE SUMMARY    Patient ID: Lauren Baker,  MRN: [id number], DOB/AGE: 1940-12-29 78 y.o.  Admit date: 10/18/2018 Discharge date: 10/26/2018  Primary Care Physician: Elfredia Nevins, MD Primary Cardiologist: Dr. Wyline Mood Electrophysiologist: Dr. Johney Frame (new this admission)  Primary Discharge Diagnosis:  1. Symptomatic bradycardia  2. Tachycardia-bradycardia 3. UTI  Secondary Discharge Diagnosis:  1. Permanent Afib     CHA2DS2Vasc is 4, on Eliquis 2. HTN  No Known Allergies   Procedures This Admission:  1.  Implantation of a MDT single chamber PPM on 10/25/2018 by Dr Johney Frame.  The patient received a Medtronic model 5076- 58 (serial number [id number]) right ventricular lead, Medtronic Azure XT SR MRI SureScan model [id number] (serial number [id number]) pacemaker There were no immediate post procedure complications. 2.  CXR on 10/26/2018 demonstrated no pneumothorax status post device implantation.   Brief HPI: Lauren Baker is a 78 y.o. female has hx of new AFib (Aug 2019) and HTN only  was initially treated at [facility] via EMS, pt apparently called family reported she was on the floor and too weak to get up, (by notes) EMS found her bradycardic with a heart rate approximately 20 bpm, pale, mottled and slightly hypoxic. The patient was able to be aroused but was becoming more and more obtunded. In the ER, there was concern for a possible calcium channel or beta blocker overdose. Labs were significant for ABG7.21/39/82, bicarb 11, lactate 7.3, glucose 396, Cr 1.64, troponin negative ER MD noted "She is hyperglycemic but has no history of diabetes, I suspect this is related to insulin resistance secondary to the diltiazem. I immediately called the [facility] and with the assistance of Dr. Caryn Section have worked on a medication regimen to help including intermittent boluses of epinephrine as the patient did not respond to prehospital or emergency  department administered atropine. The patient has improved significantly with intermittent boluses of 0.5 mg of epinephrine however she does have an acute kidney injury and will likely need some fluid resuscitation. We have done high-dose insulin and she received 85 units of insulin by bolus without significant improvement in her pressure" She had minimal improvement, started on dopamine, externally paced, intubated and transferred to [facility]  Hospital Course:  The patient was admitted to ICU, her HR had improved, transcutaneous pacing held with AFib rates 50's-60's  There was some concern that the patient may have accidentally taken extra of her metoprolol She was on dopamine gtt, Epi gtt, levophed gtt, bicarb.  By the following morning pressor and gtts had been weaned off and by afternoon extubated.  In reviw of record though only fairly recently in Aug found with AFib, was asymptomatic, found incidentally and patient had not wanted to pursue DCCV, planned for rate control strategy alone.  Requiring over the period of some months continued uptitration of meds to gain rate control.  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  TTE noted LVEF >65%  The patient was planend for PPM implant, recommended leadless device given permanent Afib.  Overnight she developed dysuria and found with UTI, started on antibiotics.,  HR remained better and PPM held off given UTI.  She eventually had AFib w/RVR requiring resumption of betablocker while waiting on PPM.  10/25/2018, after days of antibiotics, remaining afebrile, no leukocytosis, underwent PPM.  Unfortunately given COVID-19 concerns, and restrictions, [employer] was unable to provide industry support for leadless device, and in d/w patient/family traditional  transvenous pacing undertaken.    She had total of 6 days of IV antibiotics hat her UTI was sensitive to by C&S, and in d/w Dr. Johney Frame feel this is a completed course,  particularly with her implant antibiotics The patient was evaluated by PT who recommended home PT and 24 hour assistance, OT recommended ome OT as well as supervision, that said however the patient and family in the environment of Covid19, are very reluctant to have home care coming in, and do think that is reasonable.  In d/w Case management, should they decide they want they were informed to reach out to her PMD for arrangements  She was monitored on telemetry throughout her stay demonstrated AFib did get fast off her meds though improved this morning, 80's-110, will send home back at her home dose diltiazem.  Left chest was without hematoma or ecchymosis. CXR was obtained and demonstrated no pneumothorax status post device implantation.  Dr. Johney Frame has reviewed CXR and noted stable lead placement.  Wound care, arm mobility, and restrictions were reviewed with the patient.  The patient feels well, no CP or SOB, minimal site discomfort.  She was examined by Dr.Allred and considered stable for discharge to home with walker and 3in1 commode recommended by PT. She ambulated well this AM in d/w RN with walker, went well past the elevators and back without difficulty  Resume Eliquis tomorrow I have at the patient family request sent new rx for her cardiac meds to [facility].  They will reach out to her PMD to change the rest of her medicines to the new pharmacy   Physical Exam: Vitals:   10/25/18 0943 10/25/18 1431 10/25/18 2034 10/26/18 0552  BP: (!) 141/82 100/66 135/80 131/83  Pulse: 68 68 80 89  Resp:  Temp:  98.7 F (37.1 C) 98.7 F (37.1 C) 98.4 F (36.9 C)  TempSrc:  Oral Oral Oral  SpO2: 91% 94% 94% 94%  Weight:      Height:        GEN- The patient is well appearing, alert and oriented x 3 today.   HEENT: normocephalic, atraumatic; sclera clear, conjunctiva pink; hearing intact; oropharynx clear; neck supple, no JVP Lungs- CTA b/l, normal work of breathing.   No wheezes, rales, rhonchi Heart- irreg-irreg, no murmurs, rubs or gallops, PMI not laterally displaced GI- soft, non-tender, non-distended, bowel sounds present, no hepatosplenomegaly Extremities- no clubbing, cyanosis, or edema MS- no significant deformity or atrophy Skin- warm and dry, no rash or lesion, left chest without hematoma/ecchymosis Psych- euthymic mood, full affect Neuro- no gross deficits   Labs:   Lab Results  Component Value Date   WBC 5.0 10/26/2018   HGB 11.9 (L) 10/26/2018   HCT 35.0 (L) 10/26/2018   MCV 93.8 10/26/2018   PLT 162 10/26/2018    Recent Labs  Lab 10/23/18 0259  10/26/18 0330  NA 140   < > 138  K 3.3*   < > 3.5  CL 105   < > 102  CO2 29   < > 27  BUN 9   < > 12  CREATININE 0.84   < > 0.92  CALCIUM 9.1   < > 9.3  PROT 5.6*  --   --   BILITOT 0.9  --   --   ALKPHOS 73  --   --   ALT 38  --   --   AST 30  --   --   GLUCOSE  101*   < > 117*   < > = values in this interval not displayed.    Discharge Medications:  Allergies as of 10/26/2018   No Known Allergies     Medication List    TAKE these medications   apixaban 5 MG Tabs tablet Commonly known as:  Eliquis Take 1 tablet (5 mg total) by mouth 2 (two) times daily. Notes to patient:  Resume on Wed 10/27/2018 morning dose   buPROPion 100 MG tablet Commonly known as:  WELLBUTRIN Take 100 mg by mouth 3 (three) times daily.   cholecalciferol 1000 units tablet Commonly known as:  VITAMIN D Take 1,000 Units by mouth daily.   clonazePAM 0.5 MG tablet Commonly known as:  KLONOPIN Take 0.5 mg by mouth 2 (two) times daily.   diltiazem 300 MG 24 hr capsule Commonly known as:  Cardizem CD Take 1 capsule (300 mg total) by mouth daily.   DULoxetine 30 MG capsule Commonly known as:  CYMBALTA Take 30 mg by mouth daily.   losartan 50 MG tablet Commonly known as:  COZAAR Take 1 tablet (50 mg total) by mouth daily.   metoprolol 200 MG 24 hr tablet Commonly known as:  TOPROL-XL  Take 1 tablet (200 mg total) by mouth daily. What changed:  medication strength   QUEtiapine 50 MG tablet Commonly known as:  SEROQUEL Take 50 mg by mouth at bedtime.            Durable Medical Equipment  (From admission, onward)         Start     Ordered   10/26/18 1038  For home use only DME Walker rolling  Once    Question:  Patient needs a walker to treat with the following condition  Answer:  Weakness   10/26/18 1038   10/26/18 1038  For home use only DME 3 n 1  Once     10/26/18 1038          Disposition: Home Discharge Instructions    Diet - low sodium heart healthy   Complete by:  As directed    Increase activity slowly   Complete by:  As directed      Follow-up Information    [facility] Follow up.   Why:  Rolling Walker, Bedside Commode.        [facility] Follow up.   Specialty:  Cardiology Why:  11/08/2018 @ 12:00PM (noon), wound check visit Contact information: [address], [address] [place] [address] [phone]       Hillis Range, MD Follow up.   Specialty:  Cardiology Why:  01/27/2019 @ 2:15PM Contact information: [address] [place] [address] [phone]           Duration of Discharge Encounter: Greater than 30 minutes including physician time.  Norma Fredrickson, PA-C 10/26/2018 11:27 AM

## 2018-10-25 NOTE — Progress Notes (Signed)
Progress Note  Patient Name: Lauren Baker Date of Encounter: 10/25/2018  Primary Cardiologist: Dina Rich, MD  Subjective   Doing well today. No fevers or chills.  Inpatient Medications    Scheduled Meds: . [MAR Hold] buPROPion  100 mg Oral TID  . Chlorhexidine Gluconate Cloth  6 each Topical Daily  . [MAR Hold] cholecalciferol  1,000 Units Oral Daily  . [MAR Hold] clonazePAM  0.5 mg Oral BID  . [MAR Hold] diltiazem  120 mg Oral Daily  . [MAR Hold] DULoxetine  30 mg Oral Daily  . [MAR Hold] fentaNYL (SUBLIMAZE) injection  50 mcg Intravenous Once  . gentamicin irrigation  80 mg Irrigation To Cath  . [MAR Hold] mouth rinse  15 mL Mouth Rinse BID  . [MAR Hold] metoprolol tartrate  75 mg Oral Q6H  . [MAR Hold] mupirocin ointment  1 application Nasal BID  . [MAR Hold] QUEtiapine  50 mg Oral QHS   Continuous Infusions: . [MAR Hold] sodium chloride Stopped (10/20/18 0444)  . sodium chloride    . sodium chloride 50 mL/hr at 10/25/18 0609  .  ceFAZolin (ANCEF) IV    . dextrose Stopped (10/20/18 0441)  . [MAR Hold] epinephrine Stopped (10/19/18 0900)  . fentaNYL infusion INTRAVENOUS Stopped (10/19/18 0919)   PRN Meds: Place/Maintain arterial line **AND** [MAR Hold] sodium chloride, [MAR Hold] acetaminophen, [MAR Hold] fentaNYL   Vital Signs    Vitals:   10/24/18 2132 10/25/18 0009 10/25/18 0601 10/25/18 0604  BP: 116/87 (!) 123/94  128/84  Pulse: (!) 107 (!) 152  (!) 129  Resp: 18  18   Temp: 98.4 F (36.9 C)  97.7 F (36.5 C)   TempSrc: Oral  Oral   SpO2: 100%     Weight:   87.9 kg   Height:        Intake/Output Summary (Last 24 hours) at 10/25/2018 0758 Last data filed at 10/25/2018 0626 Gross per 24 hour  Intake 611 ml  Output 200 ml  Net 411 ml   Last 3 Weights 10/25/2018 10/24/2018 10/23/2018  Weight (lbs) 193 lb 12.6 oz 192 lb 3.9 oz 202 lb 6.1 oz  Weight (kg) 87.9 kg 87.2 kg 91.8 kg     Telemetry    afib - Personally Reviewed    Physical  Exam   GEN: No acute distress.  HEENT: Normocephalic, atraumatic, sclera non-icteric. Neck: No JVD or bruits. Cardiac: iRRR Respiratory: Clear to auscultation bilaterally. Breathing is unlabored. GI: Soft, nontender, non-distended, BS +x 4. MS: no deformity. Extremities: No clubbing or cyanosis. No edema. Distal pedal pulses are 2+ and equal bilaterally.  Labs    Chemistry Recent Labs  Lab 10/18/18 1735 10/18/18 2121  10/23/18 0259 10/24/18 0426 10/25/18 0415  NA 137 138   < > 140 141 139  K 4.2 3.0*   < > 3.3* 3.6 3.6  CL 106 112*   < > 105 102 105  CO2 11* 18*   < > GLUCOSE 396* 201*   < > 101* 104* 112*  BUN 15 15   < > CREATININE 1.64* 1.65*   < > 0.84 1.00 0.92  CALCIUM 8.9 8.6*   < > 9.1 9.2 9.2  PROT 6.0* 5.6*  --  5.6*  --   --   ALBUMIN 3.0* 2.7*  --  2.4*  --   --   AST 69* 228*  --  30  --   --  ALT 51* 131*  --  38  --   --   ALKPHOS 87 97  --  73  --   --   BILITOT 0.9 2.3*  --  0.9  --   --   GFRNONAA 30* 30*   < > >60 55* >60  GFRAA 35* 35*   < > >60 >60 >60  ANIONGAP 20* 8   < > 6 11 6    < > = values in this interval not displayed.     Hematology Recent Labs  Lab 10/18/18 1735 10/18/18 2155 10/21/18 0554 10/22/18 0406  WBC 12.4*  --  5.8 5.6  RBC 4.27  --  3.42* 3.42*  HGB 13.3 11.2* 10.5* 10.6*  HCT 42.7 33.0* 32.1* 32.0*  MCV 100.0  --  93.9 93.6  MCH 31.1  --  30.7 31.0  MCHC 31.1  --  32.7 33.1  RDW 13.8  --  13.6 13.6  PLT 180  --  108* 118*    Cardiac Enzymes Recent Labs  Lab 10/18/18 1735  TROPONINI <0.03   No results for input(s): TROPIPOC in the last 168 hours.   BNPNo results for input(s): BNP, PROBNP in the last 168 hours.   DDimer No results for input(s): DDIMER in the last 168 hours.    Cardiac Studies   2D echo 10/19/18 IMPRESSIONS  1. The left ventricle has hyperdynamic systolic function, with an ejection fraction of >65%. The cavity size was normal. There is mildly increased left ventricular  wall thickness. Left ventricular diastolic function could not be evaluated secondary to  atrial fibrillation.  2. The right ventricle has normal systolic function. The cavity was normal. There is no increase in right ventricular wall thickness.  3. Left atrial size was mildly dilated.  4. Mild thickening of the mitral valve leaflet.  5. The tricuspid valve is grossly normal.  6. The aortic valve is tricuspid Mild thickening of the aortic valve Aortic valve regurgitation is trivial by color flow Doppler.  7. The inferior vena cava was normal in size with <50% respiratory variability.  8. Vigorous LV systolic function; mild LVH; trace AI; mild MR; mild LAE; mild TR; moderately elevated pulmonary pressure.  Patient Profile     78 y.o. female with recently diagnosed persistent AFib (04/2018), HTN, overweight who presented to [facility] with profound bradycardia and cardiogenic shock and acute encephalopathy requiring airway protection with intubation. Concern was for patient accidentally confusing meds and taking too much.  Assessment & Plan    1. Tachy-brady syndrome - MD to finalize plans for PPM today.    2. Permanent atrial fib - apixaban on hold pending PPM procedure. Rate control strategy was being pursued per notes. Dx 03/2018 of unclear chronicity.    3. UTI (E Coli - pan sensitive) - received 4 days of IV ceftriaxone. Dr. Odessa Fleming note yesterday stated since it was cefazolin sensitive he would stop ceftriaxone. Not clear what this statement meant. Given symptomatic elderly lady may need to consider 7-10 days of therapy. Given cephalosporin sensitive, would be able to use Keflex I.e. 500mg  BID.  4. Anemia/thrombocytopenia - no baseline for comparison. Admit labs may not be reliable because they were obtained in setting of acute illness. Labs from 3/13 appeared stable from the day prior. F/u labs in AM.  For questions or updates, please contact [facility] Please consult [email or web address] for  contact info under Cardiology/STEMI.  Signed, Laurann Montana, PA-C 10/25/2018, 7:58 AM     I have  seen, examined the patient, and reviewed the above assessment and plan.  Changes to above are made where necessary.  On exam, iRR.    The patient has symptomatic bradycardia.  I would therefore recommend pacemaker implantation at this time.  Unfortunately with COVID 19, we do not have industry support for leadless pacemaker at this time.  I would therefore advise a traditional transvenous pacemaker.  Risks, benefits, alternatives to pacemaker implantation were discussed in detail with the patient and her family today. The patient understands that the risks include but are not limited to bleeding, infection, pneumothorax, perforation, tamponade, vascular damage, renal failure, MI, stroke, death,  and lead dislodgement and wishes to proceed.     Co Sign: Hillis Range, MD 10/25/2018 8:25 AM

## 2018-10-25 NOTE — Progress Notes (Signed)
PT Cancellation Note  Patient Details Name: MAELEA MOLSTAD MRN: [id number] DOB: 1941-01-07   Cancelled Treatment:    Reason Eval/Treat Not Completed: Patient at procedure or test/unavailable Pt off floor in OR. Will follow up as time allows.   Blake Divine A Durk Carmen 10/25/2018, 7:48 AM Mylo Red, PT, DPT Acute Rehabilitation Services Pager [phone] Office [phone]

## 2018-10-25 NOTE — H&P (View-Only) (Signed)
 Progress Note  Patient Name: Lauren Baker Date of Encounter: 10/25/2018  Primary Cardiologist: Branch, Jonathan, MD  Subjective   Doing well today. No fevers or chills.  Inpatient Medications    Scheduled Meds: . [MAR Hold] buPROPion  100 mg Oral TID  . Chlorhexidine Gluconate Cloth  6 each Topical Daily  . [MAR Hold] cholecalciferol  1,000 Units Oral Daily  . [MAR Hold] clonazePAM  0.5 mg Oral BID  . [MAR Hold] diltiazem  120 mg Oral Daily  . [MAR Hold] DULoxetine  30 mg Oral Daily  . [MAR Hold] fentaNYL (SUBLIMAZE) injection  50 mcg Intravenous Once  . gentamicin irrigation  80 mg Irrigation To Cath  . [MAR Hold] mouth rinse  15 mL Mouth Rinse BID  . [MAR Hold] metoprolol tartrate  75 mg Oral Q6H  . [MAR Hold] mupirocin ointment  1 application Nasal BID  . [MAR Hold] QUEtiapine  50 mg Oral QHS   Continuous Infusions: . [MAR Hold] sodium chloride Stopped (10/20/18 0444)  . sodium chloride    . sodium chloride 50 mL/hr at 10/25/18 0609  .  ceFAZolin (ANCEF) IV    . dextrose Stopped (10/20/18 0441)  . [MAR Hold] epinephrine Stopped (10/19/18 0900)  . fentaNYL infusion INTRAVENOUS Stopped (10/19/18 0919)   PRN Meds: Place/Maintain arterial line **AND** [MAR Hold] sodium chloride, [MAR Hold] acetaminophen, [MAR Hold] fentaNYL   Vital Signs    Vitals:   10/24/18 2132 10/25/18 0009 10/25/18 0601 10/25/18 0604  BP: 116/87 (!) 123/94  128/84  Pulse: (!) 107 (!) 152  (!) 129  Resp: 18  18   Temp: 98.4 F (36.9 C)  97.7 F (36.5 C)   TempSrc: Oral  Oral   SpO2: 100%     Weight:   87.9 kg   Height:        Intake/Output Summary (Last 24 hours) at 10/25/2018 0758 Last data filed at 10/25/2018 0626 Gross per 24 hour  Intake 611 ml  Output 200 ml  Net 411 ml   Last 3 Weights 10/25/2018 10/24/2018 10/23/2018  Weight (lbs) 193 lb 12.6 oz 192 lb 3.9 oz 202 lb 6.1 oz  Weight (kg) 87.9 kg 87.2 kg 91.8 kg     Telemetry    afib - Personally Reviewed    Physical  Exam   GEN: No acute distress.  HEENT: Normocephalic, atraumatic, sclera non-icteric. Neck: No JVD or bruits. Cardiac: iRRR Respiratory: Clear to auscultation bilaterally. Breathing is unlabored. GI: Soft, nontender, non-distended, BS +x 4. MS: no deformity. Extremities: No clubbing or cyanosis. No edema. Distal pedal pulses are 2+ and equal bilaterally.  Labs    Chemistry Recent Labs  Lab 10/18/18 1735 10/18/18 2121  10/23/18 0259 10/24/18 0426 10/25/18 0415  NA 137 138   < > 140 141 139  K 4.2 3.0*   < > 3.3* 3.6 3.6  CL 106 112*   < > 105 102 105  CO2 11* 18*   < > 29 28 28  GLUCOSE 396* 201*   < > 101* 104* 112*  BUN 15 15   < > 9 9 12  CREATININE 1.64* 1.65*   < > 0.84 1.00 0.92  CALCIUM 8.9 8.6*   < > 9.1 9.2 9.2  PROT 6.0* 5.6*  --  5.6*  --   --   ALBUMIN 3.0* 2.7*  --  2.4*  --   --   AST 69* 228*  --  30  --   --     ALT 51* 131*  --  38  --   --   ALKPHOS 87 97  --  73  --   --   BILITOT 0.9 2.3*  --  0.9  --   --   GFRNONAA 30* 30*   < > >60 55* >60  GFRAA 35* 35*   < > >60 >60 >60  ANIONGAP 20* 8   < > 6 11 6    < > = values in this interval not displayed.     Hematology Recent Labs  Lab 10/18/18 1735 10/18/18 2155 10/21/18 0554 10/22/18 0406  WBC 12.4*  --  5.8 5.6  RBC 4.27  --  3.42* 3.42*  HGB 13.3 11.2* 10.5* 10.6*  HCT 42.7 33.0* 32.1* 32.0*  MCV 100.0  --  93.9 93.6  MCH 31.1  --  30.7 31.0  MCHC 31.1  --  32.7 33.1  RDW 13.8  --  13.6 13.6  PLT 180  --  108* 118*    Cardiac Enzymes Recent Labs  Lab 10/18/18 1735  TROPONINI <0.03   No results for input(s): TROPIPOC in the last 168 hours.   BNPNo results for input(s): BNP, PROBNP in the last 168 hours.   DDimer No results for input(s): DDIMER in the last 168 hours.    Cardiac Studies   2D echo 10/19/18 IMPRESSIONS  1. The left ventricle has hyperdynamic systolic function, with an ejection fraction of >65%. The cavity size was normal. There is mildly increased left ventricular  wall thickness. Left ventricular diastolic function could not be evaluated secondary to  atrial fibrillation.  2. The right ventricle has normal systolic function. The cavity was normal. There is no increase in right ventricular wall thickness.  3. Left atrial size was mildly dilated.  4. Mild thickening of the mitral valve leaflet.  5. The tricuspid valve is grossly normal.  6. The aortic valve is tricuspid Mild thickening of the aortic valve Aortic valve regurgitation is trivial by color flow Doppler.  7. The inferior vena cava was normal in size with <50% respiratory variability.  8. Vigorous LV systolic function; mild LVH; trace AI; mild MR; mild LAE; mild TR; moderately elevated pulmonary pressure.  Patient Profile     78 y.o. female with recently diagnosed persistent AFib (04/2018), HTN, overweight who presented to [facility] with profound bradycardia and cardiogenic shock and acute encephalopathy requiring airway protection with intubation. Concern was for patient accidentally confusing meds and taking too much.  Assessment & Plan    1. Tachy-brady syndrome - MD to finalize plans for PPM today.    2. Permanent atrial fib - apixaban on hold pending PPM procedure. Rate control strategy was being pursued per notes. Dx 03/2018 of unclear chronicity.    3. UTI (E Coli - pan sensitive) - received 4 days of IV ceftriaxone. Dr. Odessa Fleming note yesterday stated since it was cefazolin sensitive he would stop ceftriaxone. Not clear what this statement meant. Given symptomatic elderly lady may need to consider 7-10 days of therapy. Given cephalosporin sensitive, would be able to use Keflex I.e. 500mg  BID.  4. Anemia/thrombocytopenia - no baseline for comparison. Admit labs may not be reliable because they were obtained in setting of acute illness. Labs from 3/13 appeared stable from the day prior. F/u labs in AM.  For questions or updates, please contact [facility] Please consult [email or web address] for  contact info under Cardiology/STEMI.  Signed, Laurann Montana, PA-C 10/25/2018, 7:58 AM     I have  seen, examined the patient, and reviewed the above assessment and plan.  Changes to above are made where necessary.  On exam, iRR.    The patient has symptomatic bradycardia.  I would therefore recommend pacemaker implantation at this time.  Unfortunately with COVID 19, we do not have industry support for leadless pacemaker at this time.  I would therefore advise a traditional transvenous pacemaker.  Risks, benefits, alternatives to pacemaker implantation were discussed in detail with the patient and her family today. The patient understands that the risks include but are not limited to bleeding, infection, pneumothorax, perforation, tamponade, vascular damage, renal failure, MI, stroke, death,  and lead dislodgement and wishes to proceed.     Co Sign: Hillis Range, MD 10/25/2018 8:25 AM

## 2018-10-25 NOTE — Interval H&P Note (Signed)
History and Physical Interval Note:  10/25/2018 8:27 AM  Lauren Baker  has presented today for surgery, with the diagnosis of Bradycardia.  The various methods of treatment have been discussed with the patient and family. After consideration of risks, benefits and other options for treatment, the patient has consented to  Procedure(s): PACEMAKER IMPLANT (N/A) as a surgical intervention.  The patient's history has been reviewed, patient examined, no change in status, stable for surgery.  I have reviewed the patient's chart and labs.  Questions were answered to the patient's satisfaction.     Hillis Range

## 2018-10-25 NOTE — Progress Notes (Signed)
OT Cancellation Note  Patient Details Name: Lauren Baker MRN: [id number] DOB: [birth year]   Cancelled Treatment:    Reason Eval/Treat Not Completed: Patient at procedure or test/ unavailable. Off the floor in OR. Will follow as schedule allows. Thank you.  Makyi Ledo M Arliss Frisina Judith Campillo MSOT, OTR/L Acute Rehab Pager: [phone] Office: [phone] 10/25/2018, 8:46 AM

## 2018-10-25 NOTE — Progress Notes (Signed)
Occupational Therapy Treatment Patient Details Name: Lauren Baker MRN: [id number] DOB: 1940/12/05 Today's Date: 10/25/2018    History of present illness 78 y.o. female with a hx of new AFib (Aug 2019) and HTN onlyadmitted with near syncope, profound bradycardia requiring transcutaneous pacing, and intubation for protectino of airway/lethargy transferred from [facility] to [facility]    OT comments  Pt progressing towards established OT goals. Pt performing grooming at sink with Min guard A. Pt requiring Min A for toileting. Discussed use of 3N1 over toilet at home to increase safety and independence. Pt HR fluctuating between 80-100 during ADLs. Continue to recommend dc home with HHOT and will continue to follow acutely as admitted.    Follow Up Recommendations  Home health OT;Supervision/Assistance - 24 hour    Equipment Recommendations  3 in 1 bedside commode    Recommendations for Other Services      Precautions / Restrictions Precautions Precautions: Fall Restrictions Weight Bearing Restrictions: No       Mobility Bed Mobility Overal bed mobility: Needs Assistance Bed Mobility: Supine to Sit;Sit to Supine     Supine to sit: Supervision Sit to supine: Supervision   General bed mobility comments: supervision for safety  Transfers Overall transfer level: Needs assistance Equipment used: 1 person hand held assist Transfers: Sit to/from Stand Sit to Stand: Min assist         General transfer comment: Min A for power up into standing    Balance Overall balance assessment: Needs assistance Sitting-balance support: No upper extremity supported;Feet supported Sitting balance-Leahy Scale: Fair     Standing balance support: Bilateral upper extremity supported;During functional activity Standing balance-Leahy Scale: Fair Standing balance comment: Able to maintain static standing balance                           ADL either performed or assessed with clinical  judgement   ADL Overall ADL's : Needs assistance/impaired     Grooming: Min guard;Standing;Brushing hair;Wash/dry hands Grooming Details (indicate cue type and reason): Min Guard A at the sink           Upper Body Dressing Details (indicate cue type and reason): Adjusting sling for proper position     Toilet Transfer: Minimal assistance;Ambulation;Grab Paramedic Details (indicate cue type and reason): Min A for power up into standing for low surface Toileting- Clothing Manipulation and Hygiene: Minimal assistance;Sit to/from stand Toileting - Clothing Manipulation Details (indicate cue type and reason): Min A for gown management     Functional mobility during ADLs: Minimal assistance(Single hand held A) General ADL Comments: HR flucuating between 80-100s     Vision Patient Visual Report: No change from baseline     Perception     Praxis      Cognition Arousal/Alertness: Awake/alert Behavior During Therapy: WFL for tasks assessed/performed Overall Cognitive Status: Within Functional Limits for tasks assessed                                          Exercises     Shoulder Instructions       General Comments Son and daughter in law present throughotu    Pertinent Vitals/ Pain       Pain Assessment: No/denies pain  Home Living Family/patient expects to be discharged to:: Private residence Living Arrangements: Alone Available Help at Discharge: Family;Available 24  hours/day(3 adult sons and an adult daughter) Type of Home: House Home Access: Stairs to enter Secretary/administrator of Steps: 1 Entrance Stairs-Rails: None Home Layout: One level     Bathroom Shower/Tub: Chief Strategy Officer: Standard     Home Equipment: Shower seat;Hand held shower head          Prior Functioning/Environment Level of Independence: Independent        Comments: ADLs, IADLs, driving, and medication management.    Frequency  Min 2X/week        Progress Toward Goals  OT Goals(current goals can now be found in the care plan section)  Progress towards OT goals: Progressing toward goals  Acute Rehab OT Goals Patient Stated Goal: to go home OT Goal Formulation: With patient/family Time For Goal Achievement: 11/03/18 Potential to Achieve Goals: Good ADL Goals Pt Will Perform Grooming: with supervision;standing Pt Will Perform Upper Body Dressing: with modified independence;sitting Pt Will Perform Lower Body Dressing: with supervision;sit to/from stand Pt Will Transfer to Toilet: with modified independence;ambulating  Plan Discharge plan remains appropriate    Co-evaluation                 AM-PAC OT "6 Clicks" Daily Activity     Outcome Measure   Help from another person eating meals?: None Help from another person taking care of personal grooming?: A Little Help from another person toileting, which includes using toliet, bedpan, or urinal?: A Little Help from another person bathing (including washing, rinsing, drying)?: A Little Help from another person to put on and taking off regular upper body clothing?: A Little Help from another person to put on and taking off regular lower body clothing?: A Lot 6 Click Score: 18    End of Session Equipment Utilized During Treatment: Other (comment)(Sling)  OT Visit Diagnosis: Unsteadiness on feet (R26.81);Other abnormalities of gait and mobility (R26.89);Muscle weakness (generalized) (M62.81)   Activity Tolerance Patient tolerated treatment well   Patient Left in bed;with call bell/phone within reach;with family/visitor present   Nurse Communication Mobility status        Time: 0768-0881 OT Time Calculation (min): 25 min  Charges: OT General Charges $OT Visit: 1 Visit OT Treatments $Self Care/Home Management : 8-22 mins  Kimisha Eunice MSOT, OTR/L Acute Rehab Pager: [phone] Office: [phone]   Theodoro Grist  Katryna Tschirhart 10/25/2018, 5:31 PM

## 2018-10-26 ENCOUNTER — Inpatient Hospital Stay (HOSPITAL_COMMUNITY): Payer: Medicare HMO

## 2018-10-26 LAB — CBC
HCT: 35 % — ABNORMAL LOW (ref 36.0–46.0)
Hemoglobin: 11.9 g/dL — ABNORMAL LOW (ref 12.0–15.0)
MCH: 31.9 pg (ref 26.0–34.0)
MCHC: 34 g/dL (ref 30.0–36.0)
MCV: 93.8 fL (ref 80.0–100.0)
Platelets: 162 10*3/uL (ref 150–400)
RBC: 3.73 MIL/uL — ABNORMAL LOW (ref 3.87–5.11)
RDW: 14.4 % (ref 11.5–15.5)
WBC: 5 10*3/uL (ref 4.0–10.5)
nRBC: 0 % (ref 0.0–0.2)

## 2018-10-26 LAB — BASIC METABOLIC PANEL
Anion gap: 9 (ref 5–15)
BUN: 12 mg/dL (ref 8–23)
CO2: 27 mmol/L (ref 22–32)
Calcium: 9.3 mg/dL (ref 8.9–10.3)
Chloride: 102 mmol/L (ref 98–111)
Creatinine, Ser: 0.92 mg/dL (ref 0.44–1.00)
GFR calc Af Amer: >60 mL/min (ref >60)
GFR calc non Af Amer: >60 mL/min (ref >60)
Glucose, Bld: 117 mg/dL — ABNORMAL HIGH (ref 70–99)
Potassium: 3.5 mmol/L (ref 3.5–5.1)
Sodium: 138 mmol/L (ref 135–145)

## 2018-10-26 MED ORDER — DILTIAZEM HCL ER COATED BEADS 300 MG PO CP24: 300.0000 mg | ORAL_CAPSULE | Freq: Every day | ORAL | 1 refills | Status: DC

## 2018-10-26 MED ORDER — METOPROLOL SUCCINATE ER 200 MG PO TB24: 200.0000 mg | ORAL_TABLET | Freq: Every day | ORAL | 1 refills | Status: DC

## 2018-10-26 MED ORDER — APIXABAN 5 MG PO TABS: 5.0000 mg | ORAL_TABLET | Freq: Two times a day (BID) | ORAL | 1 refills | Status: DC

## 2018-10-26 MED ORDER — LOSARTAN POTASSIUM 50 MG PO TABS: 50.0000 mg | ORAL_TABLET | Freq: Every day | ORAL | 1 refills | Status: DC

## 2018-10-26 MED FILL — Lidocaine HCl Local Preservative Free (PF) Inj 1%: INTRAMUSCULAR | Qty: 30 | Status: AC

## 2018-10-26 NOTE — TOC Initial Note (Signed)
Transition of Care [facility]) - Initial/Assessment Note    Patient Details  Name: Lauren Baker MRN: [id number] Date of Birth: 1940-10-14  Transition of Care [facility]) CM/SW Contact:    Gala Lewandowsky, RN Phone Number: 10/26/2018, 10:52 AM  Clinical Narrative: Pt presented for Bradycardia. PTA from home- has support of children, which are at the bedside. Patient is refusing [facility] Services because of the recent COVID-19. Patient does not want people in her home. CM respects patients wishes and makes her aware that if she needs services in the future to contact her PCP. Pt is agreeable to DME- ordered via [employer]- DME will be delivered to room. Patient has PCP and is able to get medications without any problems. No further needs from CM at this time.                  Expected Discharge Plan: Home w Home Health Services(Declined Home Health Services) Barriers to Discharge: No Barriers Identified   Patient Goals and CMS Choice Patient states their goals for this hospitalization and ongoing recovery are:: "to progress towards independence CMS Medicare.gov Compare Post Acute Care list provided to:: (Patient did not want to view) Choice offered to / list presented to : NA  Expected Discharge Plan and Services Expected Discharge Plan: Home w Home Health Services(Declined Home Health Services) Discharge Planning Services: CM Consult Post Acute Care Choice: Durable Medical Equipment Living arrangements for the past 2 months: Single Family Home                 DME Arranged: 3-N-1, Walker rolling DME Agency: [employer] HH Arranged: (S) Patient Refused HH(Patient aware to contact PCP if she needs services) HH Agency: NA  Prior Living Arrangements/Services Living arrangements for the past 2 months: Single Family Home Lives with:: Self Patient language and need for interpreter reviewed:: No Do you feel safe going back to the place where you live?: Yes      Need for Family Participation in  Patient Care: Yes (Comment) Care giver support system in place?: Yes (comment)   Criminal Activity/Legal Involvement Pertinent to Current Situation/Hospitalization: No - Comment as needed  Activities of Daily Living Home Assistive Devices/Equipment: None ADL Screening (condition at time of admission) Patient's cognitive ability adequate to safely complete daily activities?: Yes Is the patient deaf or have difficulty hearing?: No Does the patient have difficulty seeing, even when wearing glasses/contacts?: Yes Does the patient have difficulty concentrating, remembering, or making decisions?: No Patient able to express need for assistance with ADLs?: Yes Does the patient have difficulty dressing or bathing?: No Independently performs ADLs?: Yes (appropriate for developmental age) Does the patient have difficulty walking or climbing stairs?: No Weakness of Legs: None Weakness of Arms/Hands: None  Permission Sought/Granted Permission sought to share information with : Case Manager                Emotional Assessment Appearance:: Appears stated age Attitude/Demeanor/Rapport: Engaged Affect (typically observed): Accepting Orientation: : Oriented to Self, Oriented to Place, Oriented to  Time, Oriented to Situation Alcohol / Substance Use: Never Used Psych Involvement: No (comment)  Admission diagnosis:  Bradycardia [R00.1] Fall [W19.XXXA] Accidental overdose, initial encounter [T50.901A] Heart failure, unspecified HF chronicity, unspecified heart failure type [facility]) [I50.9] Patient Active Problem List   Diagnosis Date Noted  . Metabolic acidosis 10/19/2018  . Bradycardia 10/18/2018  . Acute respiratory failure with hypoxia (HCC)   . Shock, cardiogenic (HCC)   . Accidental overdose   . Chronic atrial  fibrillation 03/12/2018  . Essential hypertension 03/12/2018   PCP:  Elfredia Nevins, MD Pharmacy:   [facility] [place], [place] - [address] [place] [address] Phone: [phone] Fax: [phone]     Social Determinants of Health (SDOH) Interventions    Readmission Risk Interventions 30 Day Unplanned Readmission Risk Score     ED to Hosp-Admission (Current) from 10/18/2018 in [place] 6E Progressive Care  30 Day Unplanned Readmission Risk Score (%)  15 Filed at 10/26/2018 0801     This score is the patient's risk of an unplanned readmission within 30 days of being discharged (0 -100%). The score is based on dignosis, age, lab data, medications, orders, and past utilization.   Low:  0-14.9   Medium: 15-21.9   High: 22-29.9   Extreme: 30 and above       No flowsheet data found.

## 2018-10-26 NOTE — Plan of Care (Signed)
  Problem: Education: Goal: Knowledge of General Education information will improve Description Including pain rating scale, medication(s)/side effects and non-pharmacologic comfort measures Outcome: Completed/Met   Problem: Health Behavior/Discharge Planning: Goal: Ability to manage health-related needs will improve Outcome: Completed/Met   Problem: Clinical Measurements: Goal: Ability to maintain clinical measurements within normal limits will improve Outcome: Completed/Met Goal: Will remain free from infection Outcome: Completed/Met Goal: Diagnostic test results will improve Outcome: Completed/Met Goal: Respiratory complications will improve Outcome: Completed/Met Goal: Cardiovascular complication will be avoided Outcome: Completed/Met   Problem: Activity: Goal: Risk for activity intolerance will decrease Outcome: Completed/Met   Problem: Nutrition: Goal: Adequate nutrition will be maintained Outcome: Completed/Met   Problem: Coping: Goal: Level of anxiety will decrease Outcome: Completed/Met   Problem: Elimination: Goal: Will not experience complications related to bowel motility Outcome: Completed/Met Goal: Will not experience complications related to urinary retention Outcome: Completed/Met   Problem: Pain Managment: Goal: General experience of comfort will improve Outcome: Completed/Met   Problem: Safety: Goal: Ability to remain free from injury will improve Outcome: Completed/Met   Problem: Skin Integrity: Goal: Risk for impaired skin integrity will decrease Outcome: Completed/Met   Problem: Education: Goal: Knowledge of disease or condition will improve Outcome: Completed/Met Goal: Understanding of medication regimen will improve Outcome: Completed/Met Goal: Individualized Educational Video(s) Outcome: Completed/Met   Problem: Activity: Goal: Ability to tolerate increased activity will improve Outcome: Completed/Met   Problem: Cardiac: Goal:  Ability to achieve and maintain adequate cardiopulmonary perfusion will improve Outcome: Completed/Met   Problem: Health Behavior/Discharge Planning: Goal: Ability to safely manage health-related needs after discharge will improve Outcome: Completed/Met   Problem: Education: Goal: Knowledge of cardiac device and self-care will improve Outcome: Completed/Met Goal: Ability to safely manage health related needs after discharge will improve Outcome: Completed/Met Goal: Individualized Educational Video(s) Outcome: Completed/Met   Problem: Cardiac: Goal: Ability to achieve and maintain adequate cardiopulmonary perfusion will improve Outcome: Completed/Met

## 2018-10-26 NOTE — Progress Notes (Signed)
Occupational Therapy Treatment Patient Details Name: Lauren Baker MRN: [id number] DOB: [birth year] Today's Date: 10/26/2018    History of present illness 78 y.o. female with a hx of new AFib (Aug 2019) and HTN onlyadmitted with near syncope, profound bradycardia requiring transcutaneous pacing, and intubation for protectino of airway/lethargy transferred from [facility] to [facility]. 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   OT comments  Pt progressing towards established OT goals. Providing education on compensatory techniques for UB ADLs to adhere to pacemaker precautions. Pt daughter assisting in pt donning shirt, pants, and shoes providing Mod A. Discussed dc plan and pt and daughter stating they want to reduce introduction of people into the home due to COVID-19. Educated pt's daughters on ways to slowly increase pt's independence with ADLs and simple IADLs. Continue to recommend dc home once medically stable per physician. Planning for possible dc today.   Follow Up Recommendations  No OT follow up;Supervision/Assistance - 24 hour    Equipment Recommendations  3 in 1 bedside commode    Recommendations for Other Services      Precautions / Restrictions Precautions Precautions: ICD/Pacemaker;Fall Precaution Comments: Reviewed precautions related to pacemaker Restrictions Weight Bearing Restrictions: No       Mobility Bed Mobility Overal bed mobility: Needs Assistance Bed Mobility: Supine to Sit     Supine to sit: Min guard;HOB elevated     General bed mobility comments: Pt in recliner upon arrival  Transfers Overall transfer level: Needs assistance Equipment used: None Transfers: Sit to/from Stand Sit to Stand: Min guard         General transfer comment: Min Guard A for safety    Balance Overall balance assessment: Needs assistance Sitting-balance support: No upper extremity supported;Feet supported Sitting balance-Leahy Scale: Good     Standing balance support: During  functional activity Standing balance-Leahy Scale: Fair Standing balance comment: Able to maintain static standing balance but does better with UE support for walking                           ADL either performed or assessed with clinical judgement   ADL Overall ADL's : Needs assistance/impaired           Upper Body Bathing Details (indicate cue type and reason): Educating on compensatory techniques for UB bathing techniques to adhere to pace maker precautions     Upper Body Dressing : Minimal assistance;With caregiver independent assisting;Sitting Upper Body Dressing Details (indicate cue type and reason): Daughter assisting in pt donning shirt to adhere to pacemaker precautions Lower Body Dressing: Moderate assistance;Sit to/from stand Lower Body Dressing Details (indicate cue type and reason): Daughter assisting to don socks and shoes. Requiring assistance to pull pants over left hip               General ADL Comments: Reviewing pace maker precautions and compensatory techniques for UB ADLs     Vision       Perception     Praxis      Cognition Arousal/Alertness: Awake/alert Behavior During Therapy: WFL for tasks assessed/performed Overall Cognitive Status: Within Functional Limits for tasks assessed                                 General Comments: for basic mobility tasks. Looks at daughter to answer some questions. Also HOH?        Exercises     Shoulder Instructions  General Comments Daughter present during session.     Pertinent Vitals/ Pain       Pain Assessment: Faces Faces Pain Scale: Hurts a little bit Pain Location: pacemaker insertion site Pain Descriptors / Indicators: Sore;Operative site guarding Pain Intervention(s): Monitored during session;Limited activity within patient's tolerance;Repositioned  Home Living                                          Prior Functioning/Environment               Frequency  Min 2X/week        Progress Toward Goals  OT Goals(current goals can now be found in the care plan section)  Progress towards OT goals: Progressing toward goals  Acute Rehab OT Goals Patient Stated Goal: to go home OT Goal Formulation: With patient/family Time For Goal Achievement: 11/03/18 Potential to Achieve Goals: Good ADL Goals Pt Will Perform Grooming: with supervision;standing Pt Will Perform Upper Body Dressing: with modified independence;sitting Pt Will Perform Lower Body Dressing: with supervision;sit to/from stand Pt Will Transfer to Toilet: with modified independence;ambulating  Plan Discharge plan remains appropriate    Co-evaluation                 AM-PAC OT "6 Clicks" Daily Activity     Outcome Measure   Help from another person eating meals?: None Help from another person taking care of personal grooming?: A Little Help from another person toileting, which includes using toliet, bedpan, or urinal?: A Little Help from another person bathing (including washing, rinsing, drying)?: A Little Help from another person to put on and taking off regular upper body clothing?: A Little Help from another person to put on and taking off regular lower body clothing?: A Lot 6 Click Score: 18    End of Session    OT Visit Diagnosis: Unsteadiness on feet (R26.81);Other abnormalities of gait and mobility (R26.89);Muscle weakness (generalized) (M62.81)   Activity Tolerance Patient tolerated treatment well   Patient Left with call bell/phone within reach;with family/visitor present;in chair   Nurse Communication Mobility status        Time: 3545-6256 OT Time Calculation (min): 16 min  Charges: OT General Charges $OT Visit: 1 Visit OT Treatments $Self Care/Home Management : 8-22 mins  Callie Facey MSOT, OTR/L Acute Rehab Pager: [phone] Office: [phone]   Theodoro Grist Ercell Razon 10/26/2018, 12:09 PM

## 2018-10-26 NOTE — Progress Notes (Signed)
Pt had burst SVT. HR has been 90-138 throughout the night. Cards fellow paged.

## 2018-10-26 NOTE — Progress Notes (Signed)
Doing well s/p PPM implant.  V rates have improved with medicine titration.  I would anticipate that she should do ok to return to her home medicines.  Resume eliquis tomorrow.  Awaiting PT eval.  Appreciate OT assessment.  Would consider home health, though patients family and I would like to limit interactions given COVID 19.  Routine wound care and device management.  DC to home later today if she does well with PT.  Hillis Range MD, [facility] 10/26/2018 6:34 AM

## 2018-10-26 NOTE — Discharge Instructions (Signed)
° ° °  Supplemental Discharge Instructions for  Pacemaker/Defibrillator Patients   PLEASE SEND A REMOTE DEVICE TRANSMISSION ONCE HOME   Activity No heavy lifting or vigorous activity with your left/right arm for 6 to 8 weeks.  Do not raise your left/right arm above your head for one week.  Gradually raise your affected arm as drawn below.              10/29/2018                10/30/2018                10/31/2018               11/01/2018 __  NO DRIVING for  1 week   ; you may begin driving on  7/56/4332   .  WOUND CARE - Keep the wound area clean and dry.  Do not get this area wet, no showers until cleared to at your wound check visit - The tape/steri-strips on your wound will fall off; do not pull them off.  No bandage is needed on the site.  DO  NOT apply any creams, oils, or ointments to the wound area. - If you notice any drainage or discharge from the wound, any swelling or bruising at the site, or you develop a fever > 101? F after you are discharged home, call the office at once.  Special Instructions - You are still able to use cellular telephones; use the ear opposite the side where you have your pacemaker/defibrillator.  Avoid carrying your cellular phone near your device. - When traveling through airports, show security personnel your identification card to avoid being screened in the metal detectors.  Ask the security personnel to use the hand wand. - Avoid arc welding equipment, MRI testing (magnetic resonance imaging), TENS units (transcutaneous nerve stimulators).  Call the office for questions about other devices. - Avoid electrical appliances that are in poor condition or are not properly grounded. - Microwave ovens are safe to be near or to operate.

## 2018-10-26 NOTE — Progress Notes (Signed)
Physical Therapy Treatment Patient Details Name: Lauren Baker MRN: [id number] DOB: 10-10-1940 Today's Date: 10/26/2018    History of Present Illness 78 y.o. female with a hx of new AFib (Aug 2019) and HTN onlyadmitted with near syncope, profound bradycardia requiring transcutaneous pacing, and intubation for protectino of airway/lethargy transferred [facility] to [facility]. 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    PT Comments    Patient progressing well s/p pacemaker placement. Tolerated gait training with and without use of DME. Required Min A without RW due to drifting and scissoring like gait; able to progress to Min guard with use of RW for support. HR ranged from 113-150s bpm throughout activity. Education re: pacemaker precautions, how to dress/donn shirt etc. Daughter reports she will provide 24/7 supervision at home. Recommend RW and BSC for safety at home. Will continue to follow and progress as tolerated.     Follow Up Recommendations  Home health PT;Supervision/Assistance - 24 hour     Equipment Recommendations  Rolling walker with 5" wheels;3in1 (PT)    Recommendations for Other Services       Precautions / Restrictions Precautions Precautions: ICD/Pacemaker;Fall Precaution Comments: Reviewed precautions related to pacemaker Restrictions Weight Bearing Restrictions: No    Mobility  Bed Mobility Overal bed mobility: Needs Assistance Bed Mobility: Supine to Sit     Supine to sit: Min guard;HOB elevated     General bed mobility comments: Cues to get to EOB with use of rails; trying not to pull with LUE. Increased effort.  Transfers Overall transfer level: Needs assistance Equipment used: None;Rolling walker (2 wheeled) Transfers: Sit to/from Stand Sit to Stand: Min assist         General transfer comment: Min A for power up into standing with cues for hand placement/technique. Stood from [employer].   Ambulation/Gait Ambulation/Gait assistance: Min assist;Min  guard Gait Distance (Feet): 150 Feet(+75') Assistive device: Rolling walker (2 wheeled);None Gait Pattern/deviations: Step-through pattern;Decreased stride length;Drifts right/left;Scissoring Gait velocity: decreased   General Gait Details: Slow, unsteady gait without use of DME requiring MIn A for support due to scissoring; improved with use of RW for balance. Sp02 remained >92% on RA. Cues for RW proximity. HR ranged from 113-150s bpm during mobility.    Stairs             Wheelchair Mobility    Modified Rankin (Stroke Patients Only)       Balance Overall balance assessment: Needs assistance Sitting-balance support: No upper extremity supported;Feet supported Sitting balance-Leahy Scale: Good     Standing balance support: During functional activity Standing balance-Leahy Scale: Fair Standing balance comment: Able to maintain static standing balance but does better with UE support for walking                            Cognition Arousal/Alertness: Awake/alert Behavior During Therapy: WFL for tasks assessed/performed Overall Cognitive Status: Within Functional Limits for tasks assessed                                 General Comments: for basic mobility tasks. Looks at daughter to answer some questions. Also HOH?      Exercises      General Comments General comments (skin integrity, edema, etc.): Daughter present during session.       Pertinent Vitals/Pain Pain Assessment: Faces Faces Pain Scale: Hurts a little bit Pain Location: pacemaker insertion site Pain  Descriptors / Indicators: Sore;Operative site guarding Pain Intervention(s): Monitored during session    Home Living                      Prior Function            PT Goals (current goals can now be found in the care plan section) Progress towards PT goals: Progressing toward goals    Frequency    Min 3X/week      PT Plan Current plan remains  appropriate    Co-evaluation              AM-PAC PT "6 Clicks" Mobility   Outcome Measure  Help needed turning from your back to your side while in a flat bed without using bedrails?: None Help needed moving from lying on your back to sitting on the side of a flat bed without using bedrails?: A Little Help needed moving to and from a bed to a chair (including a wheelchair)?: A Little Help needed standing up from a chair using your arms (e.g., wheelchair or bedside chair)?: A Little Help needed to walk in hospital room?: A Little Help needed climbing 3-5 steps with a railing? : A Little 6 Click Score: 19    End of Session Equipment Utilized During Treatment: Gait belt Activity Tolerance: Patient tolerated treatment well Patient left: in bed;with call bell/phone within reach;with family/visitor present(sitting EOB with daughter) Nurse Communication: Mobility status PT Visit Diagnosis: Unsteadiness on feet (R26.81);Muscle weakness (generalized) (M62.81)     Time: 1771-1657 PT Time Calculation (min) (ACUTE ONLY): 23 min  Charges:  $Gait Training: 23-37 mins                     Mylo Red, [place], DPT Acute Rehabilitation Services Pager [phone] Office [phone]       Blake Divine A Lanier Ensign 10/26/2018, 8:55 AM

## 2018-10-26 NOTE — Care Management Important Message (Signed)
Important Message  Patient Details  Name: Lauren Baker MRN: [id number] Date of Birth: 1941-04-06   Medicare Important Message Given:  Yes    Oralia Rud Stormie Ventola 10/26/2018, 1:46 PM

## 2018-10-27 DIAGNOSIS — R2689 Other abnormalities of gait and mobility: Secondary | ICD-10-CM | POA: Diagnosis not present

## 2018-11-05 ENCOUNTER — Telehealth: Payer: Self-pay | Admitting: Cardiology

## 2018-11-05 NOTE — Telephone Encounter (Signed)
I called and spoke with patients daughter. I told her to download mychart and webex. I gave her the activation code for mychart. I will check back with patients daughter in about an hour to see if she has any questions.

## 2018-11-05 NOTE — Telephone Encounter (Signed)
Patient daughter called and stated that pt received a call on Thursday 11/04/2018 and the person who called was asking patient if she had a smart phone. No notes / phones notes in patient chart / appt desk. Pt daughter stated that pt doesn't have a smart phone but pt can use her phone to complete televisit. I told pt I would talk with Device Tech RN about appt and someone will call her back. Pt daughter request call back by the end of the day b/c pt is on Monday 11/08/2018

## 2018-11-08 ENCOUNTER — Ambulatory Visit: Payer: Medicare HMO

## 2018-11-08 NOTE — Telephone Encounter (Signed)
I spoke with patients daughter Friday afternoon. She is set up on mychart and webex for her appointment.

## 2018-11-09 ENCOUNTER — Telehealth (INDEPENDENT_AMBULATORY_CARE_PROVIDER_SITE_OTHER): Payer: Medicare HMO | Admitting: *Deleted

## 2018-11-09 ENCOUNTER — Other Ambulatory Visit: Payer: Self-pay

## 2018-11-09 VITALS — BP 129/76 | HR 58

## 2018-11-09 DIAGNOSIS — I482 Chronic atrial fibrillation, unspecified: Secondary | ICD-10-CM

## 2018-11-09 DIAGNOSIS — R001 Bradycardia, unspecified: Secondary | ICD-10-CM

## 2018-11-09 DIAGNOSIS — Z950 Presence of cardiac pacemaker: Secondary | ICD-10-CM | POA: Diagnosis not present

## 2018-11-09 LAB — CUP PACEART REMOTE DEVICE CHECK
Battery Voltage: 3.23 V
Brady Statistic RV Percent Paced: 13.13 %
Date Time Interrogation Session: 20200331190552
Implantable Lead Implant Date: 20200316
Implantable Lead Model: 5076
Lead Channel Impedance Value: 361 Ohm
Lead Channel Impedance Value: 437 Ohm
Lead Channel Pacing Threshold Pulse Width: 0.4 ms
Lead Channel Sensing Intrinsic Amplitude: 10.625 mV
Lead Channel Setting Pacing Amplitude: 3.5 V
Lead Channel Setting Pacing Pulse Width: 0.4 ms
Lead Channel Setting Sensing Sensitivity: 1.2 mV
MDC IDC LEAD LOCATION: 753860
MDC IDC MSMT BATTERY REMAINING LONGEVITY: 189 mo
MDC IDC MSMT LEADCHNL RV PACING THRESHOLD AMPLITUDE: 0.5 V
MDC IDC PG IMPLANT DT: 20200316

## 2018-11-09 NOTE — Progress Notes (Signed)
Device Clinic PPM wound check via E-visit. Steri-strips removed. Wound without redness. Incision edges approximated, wound healing well. Patient's daughter reports site appears mildly swollen--palpated device, denies fluctuance, swelling appears unchanged since hospital d/c (see photos below). Manual transmission sent in for review of device function. Threshold, sensing, and impedance trends stable and consistent with implant measurements. Device programmed at 3.5V with auto capture on for extra safety margin until 3 month visit. Histogram distribution appropriate for patient and level of activity. Permanent AF, +Eliquis. No high ventricular rates noted. Patient educated about wound care, arm mobility, lifting restrictions, and Carelink monitor. Plan for wound recheck via E-visit on 11/15/18 per patient and family request. ROV with Dr. Johney Frame on 01/27/19.  Patient gave verbal permission to include the following photos:

## 2018-11-09 NOTE — Patient Instructions (Signed)
After Your Pacemaker   . Monitor your pacemaker site for redness, swelling, and drainage. Call the device clinic at [phone] if you experience these symptoms or fever/chills.  . If your incision is closed with Steri-strips or staples:  You may shower 7 days after your procedure and wash your incision with soap and water. Avoid lotions, ointments, or perfumes over your incision until it is well-healed.  . You may use a hot tub or a pool after your wound check appointment if the incision is completely closed.  . Do not lift over 10 pounds with the affected arm until 6 weeks after your procedure. There are no other restrictions in arm movement after your wound check appointment.  . You may drive, unless driving has been restricted by your healthcare providers.  . Your Pacemaker IS  MRI compatible.  . Remote monitoring is used to monitor your pacemaker from home. This monitoring is scheduled every 91 days by our office. It allows us to keep an eye on the functioning of your device to ensure it is working properly. You will routinely see your Electrophysiologist annually (more often if necessary). 

## 2018-11-15 ENCOUNTER — Other Ambulatory Visit: Payer: Self-pay

## 2018-11-15 ENCOUNTER — Telehealth (INDEPENDENT_AMBULATORY_CARE_PROVIDER_SITE_OTHER): Payer: Medicare HMO | Admitting: *Deleted

## 2018-11-15 NOTE — Progress Notes (Signed)
Wound recheck via FaceTime per family request. Wound without redness, swelling improved per patient's daughter. Incision edges fully approximated and appear to be healing well. Patient and daughter agree to continue monitoring for any signs/symptoms of infection and are aware to call if any noted. ROV with Dr. Johney Frame on 01/27/19 as scheduled.

## 2018-12-10 DIAGNOSIS — I4891 Unspecified atrial fibrillation: Secondary | ICD-10-CM | POA: Diagnosis not present

## 2018-12-10 DIAGNOSIS — I10 Essential (primary) hypertension: Secondary | ICD-10-CM | POA: Diagnosis not present

## 2018-12-10 DIAGNOSIS — Z681 Body mass index (BMI) 19 or less, adult: Secondary | ICD-10-CM | POA: Diagnosis not present

## 2018-12-10 DIAGNOSIS — Z0001 Encounter for general adult medical examination with abnormal findings: Secondary | ICD-10-CM | POA: Diagnosis not present

## 2018-12-10 DIAGNOSIS — F330 Major depressive disorder, recurrent, mild: Secondary | ICD-10-CM | POA: Diagnosis not present

## 2018-12-10 DIAGNOSIS — F419 Anxiety disorder, unspecified: Secondary | ICD-10-CM | POA: Diagnosis not present

## 2018-12-10 DIAGNOSIS — Z1389 Encounter for screening for other disorder: Secondary | ICD-10-CM | POA: Diagnosis not present

## 2019-01-12 ENCOUNTER — Telehealth: Payer: Self-pay | Admitting: Cardiology

## 2019-01-12 NOTE — Telephone Encounter (Signed)
Virtual Visit Pre-Appointment Phone Call  "(Name), I am calling you today to discuss your upcoming appointment. We are currently trying to limit exposure to the virus that causes COVID-19 by seeing patients at home rather than in the office."  1. "What is the BEST phone number to call the day of the visit?" - include this in appointment notes  2. Do you have or have access to (through a family member/friend) a smartphone with video capability that we can use for your visit?" a. If yes - list this number in appt notes as cell (if different from BEST phone #) and list the appointment type as a VIDEO visit in appointment notes b. If no - list the appointment type as a PHONE visit in appointment notes  3. Confirm consent - "In the setting of the current Covid19 crisis, you are scheduled for a (phone or video) visit with your provider on (date) at (time).  Just as we do with many in-office visits, in order for you to participate in this visit, we must obtain consent.  If you'd like, I can send this to your mychart (if signed up) or email for you to review.  Otherwise, I can obtain your verbal consent now.  All virtual visits are billed to your insurance company just like a normal visit would be.  By agreeing to a virtual visit, we'd like you to understand that the technology does not allow for your provider to perform an examination, and thus may limit your provider's ability to fully assess your condition. If your provider identifies any concerns that need to be evaluated in person, we will make arrangements to do so.  Finally, though the technology is pretty good, we cannot assure that it will always work on either your or our end, and in the setting of a video visit, we may have to convert it to a phone-only visit.  In either situation, we cannot ensure that we have a secure connection.  Are you willing to proceed?" STAFF: Did the patient verbally acknowledge consent to telehealth visit? Document  YES/NO here: Yes  4. Advise patient to be prepared - "Two hours prior to your appointment, go ahead and check your blood pressure, pulse, oxygen saturation, and your weight (if you have the equipment to check those) and write them all down. When your visit starts, your provider will ask you for this information. If you have an Apple Watch or Kardia device, please plan to have heart rate information ready on the day of your appointment. Please have a pen and paper handy nearby the day of the visit as well."  5. Give patient instructions for MyChart download to smartphone OR [email or web address] as below if video visit (depending on what platform provider is using)  6. Inform patient they will receive a phone call 15 minutes prior to their appointment time (may be from unknown caller ID) so they should be prepared to answer    TELEPHONE CALL NOTE  Lauren Baker has been deemed a candidate for a follow-up tele-health visit to limit community exposure during the Covid-19 pandemic. I spoke with the patient via phone to ensure availability of phone/video source, confirm preferred email & phone number, and discuss instructions and expectations.  I reminded Lauren Baker to be prepared with any vital sign and/or heart rhythm information that could potentially be obtained via home monitoring, at the time of her visit. I reminded Lauren Baker to expect a phone call prior to  her visit.  Virgel Gess Goins 01/12/2019 10:51 AM

## 2019-01-14 ENCOUNTER — Telehealth (INDEPENDENT_AMBULATORY_CARE_PROVIDER_SITE_OTHER): Payer: Medicare HMO | Admitting: Cardiology

## 2019-01-14 VITALS — BP 131/78 | HR 76 | Ht 70.0 in

## 2019-01-14 DIAGNOSIS — I4891 Unspecified atrial fibrillation: Secondary | ICD-10-CM

## 2019-01-14 DIAGNOSIS — I495 Sick sinus syndrome: Secondary | ICD-10-CM

## 2019-01-14 NOTE — Progress Notes (Signed)
Virtual Visit via Telephone Note   This visit type was conducted due to national recommendations for restrictions regarding the COVID-19 Pandemic (e.g. social distancing) in an effort to limit this patient's exposure and mitigate transmission in our community.  Due to her co-morbid illnesses, this patient is at least at moderate risk for complications without adequate follow up.  This format is felt to be most appropriate for this patient at this time.  The patient did not have access to video technology/had technical difficulties with video requiring transitioning to audio format only (telephone).  All issues noted in this document were discussed and addressed.  No physical exam could be performed with this format.  Please refer to the patient's chart for her  consent [facility].   Date:  01/14/2019   ID:  Lauren Baker, DOB [birth year], MRN [id number]  Patient Location: Home Provider Location: Home  PCP:  Elfredia NevinsFusco, Lawrence, MD  Cardiologist:  Dina RichBranch, Ahliyah Nienow, MD  Electrophysiologist:  None   Evaluation Performed:  Follow-Up Visit  Chief Complaint:  Tachy brady syndrome  History of Present Illness:    Lauren Baker is a 78 y.o. female seen today for follow up of the following medical problems.   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  - received single chamber medtronic ppm 10/25/2018   - no recent palpitations. No lightheadedness or diziness - compliant with meds - no bleeding on eliquis.   2. HTN - compliant withmeds     PMH 1. Afib 2. HTN   The patient does not have symptoms concerning for COVID-19 infection (fever, chills, cough, or new shortness of breath).    Past Medical History:  Diagnosis Date  . Chronic atrial fibrillation   . Hypertension    Past Surgical History:  Procedure Laterality Date  . detatched retina    .  PACEMAKER IMPLANT N/A 10/25/2018   Procedure: PACEMAKER IMPLANT;  Surgeon: Hillis RangeAllred, James, MD;  Location: [facility];  Service: Cardiovascular;  Laterality: N/A;     No outpatient medications have been marked as taking for the 01/14/19 encounter (Appointment) with Antoine PocheBranch, Axtyn Woehler F, MD.     Allergies:   Patient has no known allergies.   Social History   Tobacco Use  . Smoking status: Never Smoker  . Smokeless tobacco: Never Used  Substance Use Topics  . Alcohol use: No  . Drug use: No     Family Hx: The patient's family history includes CVA in her mother; Cancer in her sister; Heart attack in her brother and sister; Hypertension in her sister.  ROS:   Please see the history of present illness.     All other systems reviewed and are negative.   Prior CV studies:   The following studies were reviewed today:  03/2018 echo Study Conclusions  - Left ventricle: The cavity size was normal. There was mild focal basal hypertrophy of the septum. Systolic function was normal. The estimated ejection fraction was in the range of 55% to 60%. Wall motion was normal; there were no regional wall motion abnormalities. - Aortic valve: There was mild regurgitation. - Mitral valve: Calcified annulus. There was mild regurgitation. - Left atrium: The atrium was moderately dilated. - Pulmonary arteries: Systolic pressure was mildly increased. PA peak pressure: 40 mm Hg (S).  Impressions:  - Normal LV systolic function; calcified aortic valve with mild AI; mild MR; moderate LAE; mild TR with  mild pulmonary hypertension.  10/2018 echo IMPRESSIONS    1. The left ventricle has hyperdynamic systolic function, with an ejection fraction of >65%. The cavity size was normal. There is mildly increased left ventricular wall thickness. Left ventricular diastolic function could not be evaluated secondary to  atrial fibrillation.  2. The right ventricle has normal systolic  function. The cavity was normal. There is no increase in right ventricular wall thickness.  3. Left atrial size was mildly dilated.  4. Mild thickening of the mitral valve leaflet.  5. The tricuspid valve is grossly normal.  6. The aortic valve is tricuspid Mild thickening of the aortic valve Aortic valve regurgitation is trivial by color flow Doppler.  7. The inferior vena cava was normal in size with <50% respiratory variability.  8. Vigorous LV systolic function; mild LVH; trace AI; mild MR; mild LAE; mild TR; moderately elevated pulmonary pressure.  Labs/Other Tests and Data Reviewed:    EKG:  n/a  Recent Labs: 10/18/2018: TSH 2.481 10/19/2018: Magnesium 1.4 10/23/2018: ALT 38 10/26/2018: BUN 12; Creatinine, Ser 0.92; Hemoglobin 11.9; Platelets 162; Potassium 3.5; Sodium 138   Recent Lipid Panel No results found for: CHOL, TRIG, HDL, CHOLHDL, LDLCALC, LDLDIRECT  Wt Readings from Last 3 Encounters:  10/25/18 193 lb 12.6 oz (87.9 kg)  07/02/18 188 lb (85.3 kg)  05/28/18 184 lb (83.5 kg)     Objective:    Vital Signs:  There were no vitals taken for this visit.   Vitals:   01/14/19 1019  BP: 131/78  Pulse: 76   Normal affect. Normal speech pattern and tone. Comfortable, no apparent distress. No audible signs of SOb or wheezing.   ASSESSMENT & PLAN:    Assessment and Plan  1. Afib/Tachy-brady syndrome - doing well s/p ppm. No recent symptoms, last pacemaker check did not show any significant tachycardia - continue current meds including anticoag   2. HTN -at goal, continue current meds  COVID-19 Education: The signs and symptoms of COVID-19 were discussed with the patient and how to seek care for testing (follow up with PCP or arrange E-visit).  The importance of social distancing was discussed today.  Time:   Today, I have spent 15 minutes with the patient with telehealth technology discussing the above problems.     Medication Adjustments/Labs and Tests Ordered:  Current medicines are reviewed at length with the patient today.  Concerns regarding medicines are outlined above.   Tests Ordered: No orders of the defined types were placed in this encounter.   Medication Changes: No orders of the defined types were placed in this encounter.   Disposition:  Follow up 4 months  Signed, Dina Rich, MD  01/14/2019 8:14 AM    [facility]

## 2019-01-14 NOTE — Progress Notes (Signed)
Medication Instructions:   Your physician recommends that you continue on your current medications as directed. Please refer to the Current Medication list given to you today.  Labwork:  none  Testing/Procedures:  none  Follow-Up:  Your physician recommends that you schedule a follow-up appointment in: 4 months.   Any Other Special Instructions Will Be Listed Below (If Applicable).  If you need a refill on your cardiac medications before your next appointment, please call your pharmacy. 

## 2019-01-20 ENCOUNTER — Other Ambulatory Visit: Payer: Self-pay | Admitting: Physician Assistant

## 2019-01-20 DIAGNOSIS — I10 Essential (primary) hypertension: Secondary | ICD-10-CM

## 2019-01-24 ENCOUNTER — Telehealth: Payer: Self-pay

## 2019-01-26 NOTE — Telephone Encounter (Signed)
Spoke with pts daugther regarding appt on 01/27/19. Pts daughter stated pt will check vitals prior to appt. All questions and concerns were address.

## 2019-01-27 ENCOUNTER — Encounter: Payer: Self-pay | Admitting: Internal Medicine

## 2019-01-27 ENCOUNTER — Telehealth (INDEPENDENT_AMBULATORY_CARE_PROVIDER_SITE_OTHER): Payer: Medicare HMO | Admitting: Internal Medicine

## 2019-01-27 VITALS — BP 123/73 | HR 71 | Ht 70.0 in | Wt 180.0 lb

## 2019-01-27 DIAGNOSIS — I4819 Other persistent atrial fibrillation: Secondary | ICD-10-CM | POA: Diagnosis not present

## 2019-01-27 DIAGNOSIS — I495 Sick sinus syndrome: Secondary | ICD-10-CM

## 2019-01-27 NOTE — Progress Notes (Signed)
Electrophysiology TeleHealth Note   Due to national recommendations of social distancing due to COVID 19, an audio/video telehealth visit is felt to be most appropriate for this patient at this time.  See MyChart message from today for the patient's consent to telehealth for [facility].   Date:  01/27/2019   ID:  Lauren Baker, DOB 05-23-1941, MRN [id number]  Location: patient's home  Provider location:  [facility]  Evaluation Performed: Follow-up visit  PCP:  Redmond School, MD   Electrophysiologist:  Dr Rayann Heman  Chief Complaint:  palpitations  History of Present Illness:    Lauren Baker is a 78 y.o. female who presents via telehealth conferencing today.  Since her ppm implant, the patient reports doing very well.  Her device pocket has healed.  She is doing well.  Asymptomatic with AF. Today, she denies symptoms of palpitations, chest pain, shortness of breath,  lower extremity edema, dizziness, presyncope, or syncope.  The patient is otherwise without complaint today.  The patient denies symptoms of fevers, chills, cough, or new SOB worrisome for COVID 19.  Past Medical History:  Diagnosis Date  . Chronic atrial fibrillation   . Hypertension     Past Surgical History:  Procedure Laterality Date  . detatched retina    . PACEMAKER IMPLANT N/A 10/25/2018   Procedure: PACEMAKER IMPLANT;  Surgeon: Thompson Grayer, MD;  Location: [facility];  Service: Cardiovascular;  Laterality: N/A;    Current Outpatient Medications  Medication Sig Dispense Refill  . apixaban (ELIQUIS) 5 MG TABS tablet Take 1 tablet (5 mg total) by mouth 2 (two) times daily. 180 tablet 1  . buPROPion (WELLBUTRIN) 100 MG tablet Take 100 mg by mouth 3 (three) times daily.  6  . cholecalciferol (VITAMIN D) 1000 units tablet Take 1,000 Units by mouth daily.    . clonazePAM (KLONOPIN) 0.5 MG tablet Take 0.5 mg by mouth 2 (two) times daily.  0  . diltiazem (CARDIZEM CD) 300 MG 24 hr  capsule TAKE 1 CAPSULE BY MOUTH DAILY. 90 capsule 2  . DULoxetine (CYMBALTA) 30 MG capsule Take 30 mg by mouth daily.   3  . losartan (COZAAR) 50 MG tablet TAKE 1 TABLET BY MOUTH DAILY 90 tablet 2  . metoprolol succinate (TOPROL-XL) 200 MG 24 hr tablet Take 1 tablet (200 mg total) by mouth daily. 90 tablet 1  . QUEtiapine (SEROQUEL) 50 MG tablet Take 50 mg by mouth at bedtime.   5   No current facility-administered medications for this visit.     Allergies:   Patient has no known allergies.   Social History:  The patient  reports that she has never smoked. She has never used smokeless tobacco. She reports that she does not drink alcohol or use drugs.   Family History:  The patient's  family history includes CVA in her mother; Cancer in her sister; Heart attack in her brother and sister; Hypertension in her sister.   ROS:  Please see the history of present illness.   All other systems are personally reviewed and negative.    Exam:    Vital Signs:  BP 123/73   Pulse 71   Ht 5\' 10"  (1.778 m)   Wt 180 lb (81.6 kg)   BMI 25.83 kg/m   Well appearing, NAD, pacemaker site is well healed.   Labs/Other Tests and Data Reviewed:    Recent Labs: 10/18/2018: TSH 2.481 10/19/2018: Magnesium 1.4 10/23/2018: ALT 38 10/26/2018: BUN 12; Creatinine,  Ser 0.92; Hemoglobin 11.9; Platelets 162; Potassium 3.5; Sodium 138   Wt Readings from Last 3 Encounters:  01/27/19 180 lb (81.6 kg)  10/25/18 193 lb 12.6 oz (87.9 kg)  07/02/18 188 lb (85.3 kg)     Last device remote is reviewed from PaceART PDF which reveals normal device function, V rates are at times elevated   ASSESSMENT & PLAN:    1.  Second degree AV block Normal pacemaker function Remotes are uptodate  2. Permanent afib On eliquis V rates at times are elevated No changes given paucity of symptoms   Follow-up:  Remotes,  Return to see me in the office in a year,  Follow-up with Dr Wyline MoodBranch as scheduled   Patient Risk:  after full  review of this patients clinical status, I feel that they are at moderate risk at this time.  Today, I have spent 15 minutes with the patient with telehealth technology discussing arrhythmia management .    Randolm IdolSigned, Taesha Goodell, MD  01/27/2019 2:04 PM     [facility] [address] [place] [address] [phone] (office) [phone] (fax)

## 2019-02-23 ENCOUNTER — Ambulatory Visit (INDEPENDENT_AMBULATORY_CARE_PROVIDER_SITE_OTHER): Payer: Medicare HMO | Admitting: *Deleted

## 2019-02-23 DIAGNOSIS — I495 Sick sinus syndrome: Secondary | ICD-10-CM | POA: Diagnosis not present

## 2019-02-23 LAB — CUP PACEART REMOTE DEVICE CHECK
Battery Remaining Longevity: 188 mo
Battery Voltage: 3.21 V
Brady Statistic RV Percent Paced: 8.09 %
Date Time Interrogation Session: 20200715034620
Implantable Lead Implant Date: 20200316
Implantable Lead Location: 753860
Implantable Lead Model: 5076
Implantable Pulse Generator Implant Date: 20200316
Lead Channel Impedance Value: 361 Ohm
Lead Channel Impedance Value: 437 Ohm
Lead Channel Pacing Threshold Amplitude: 0.75 V
Lead Channel Pacing Threshold Pulse Width: 0.4 ms
Lead Channel Sensing Intrinsic Amplitude: 12.5 mV
Lead Channel Setting Pacing Amplitude: 2.5 V
Lead Channel Setting Pacing Pulse Width: 0.4 ms
Lead Channel Setting Sensing Sensitivity: 1.2 mV

## 2019-03-05 ENCOUNTER — Encounter: Payer: Self-pay | Admitting: Cardiology

## 2019-03-05 NOTE — Progress Notes (Signed)
Remote pacemaker transmission.   

## 2019-04-27 NOTE — Telephone Encounter (Signed)
Error

## 2019-04-28 ENCOUNTER — Telehealth: Payer: Self-pay | Admitting: Cardiology

## 2019-04-28 NOTE — Telephone Encounter (Signed)
Please give pt's daughter a call concerning medication

## 2019-04-29 NOTE — Telephone Encounter (Signed)
LM for daughter to call back-cc 

## 2019-05-04 NOTE — Telephone Encounter (Signed)
Called daughter, no answer. Left message for her to return call.

## 2019-05-05 NOTE — Telephone Encounter (Signed)
Returned pt call. No answer. Left message for pt to return call.  

## 2019-05-09 NOTE — Telephone Encounter (Signed)
Returned pt call to [phone]. No answer. Unable to leave voicemail. I will mail pt letter asking her to return call if she still has questions.

## 2019-05-25 ENCOUNTER — Ambulatory Visit (INDEPENDENT_AMBULATORY_CARE_PROVIDER_SITE_OTHER): Payer: Medicare HMO | Admitting: *Deleted

## 2019-05-25 DIAGNOSIS — I482 Chronic atrial fibrillation, unspecified: Secondary | ICD-10-CM

## 2019-05-25 LAB — CUP PACEART REMOTE DEVICE CHECK
Battery Remaining Longevity: 185 mo
Battery Voltage: 3.19 V
Brady Statistic RV Percent Paced: 5.87 %
Date Time Interrogation Session: 20201014062624
Implantable Lead Implant Date: 20200316
Implantable Lead Location: 753860
Implantable Lead Model: 5076
Implantable Pulse Generator Implant Date: 20200316
Lead Channel Impedance Value: 361 Ohm
Lead Channel Impedance Value: 437 Ohm
Lead Channel Pacing Threshold Amplitude: 0.625 V
Lead Channel Pacing Threshold Pulse Width: 0.4 ms
Lead Channel Sensing Intrinsic Amplitude: 11.875 mV
Lead Channel Sensing Intrinsic Amplitude: 11.875 mV
Lead Channel Setting Pacing Amplitude: 2.5 V
Lead Channel Setting Pacing Pulse Width: 0.4 ms
Lead Channel Setting Sensing Sensitivity: 1.2 mV

## 2019-06-07 NOTE — Progress Notes (Signed)
Remote pacemaker transmission.   

## 2019-06-11 DIAGNOSIS — I4891 Unspecified atrial fibrillation: Secondary | ICD-10-CM | POA: Diagnosis not present

## 2019-06-11 DIAGNOSIS — I10 Essential (primary) hypertension: Secondary | ICD-10-CM | POA: Diagnosis not present

## 2019-06-11 DIAGNOSIS — F330 Major depressive disorder, recurrent, mild: Secondary | ICD-10-CM | POA: Diagnosis not present

## 2019-06-13 ENCOUNTER — Encounter: Payer: Self-pay | Admitting: *Deleted

## 2019-06-13 ENCOUNTER — Telehealth (INDEPENDENT_AMBULATORY_CARE_PROVIDER_SITE_OTHER): Payer: Medicare HMO | Admitting: Cardiology

## 2019-06-13 VITALS — BP 117/84 | HR 83 | Ht 70.0 in

## 2019-06-13 DIAGNOSIS — I10 Essential (primary) hypertension: Secondary | ICD-10-CM

## 2019-06-13 DIAGNOSIS — Z950 Presence of cardiac pacemaker: Secondary | ICD-10-CM

## 2019-06-13 DIAGNOSIS — I482 Chronic atrial fibrillation, unspecified: Secondary | ICD-10-CM

## 2019-06-13 DIAGNOSIS — I495 Sick sinus syndrome: Secondary | ICD-10-CM | POA: Diagnosis not present

## 2019-06-13 MED ORDER — METOPROLOL SUCCINATE ER 200 MG PO TB24: 200.0000 mg | ORAL_TABLET | Freq: Every day | ORAL | 3 refills | Status: DC

## 2019-06-13 MED ORDER — APIXABAN 5 MG PO TABS: 5.0000 mg | ORAL_TABLET | Freq: Two times a day (BID) | ORAL | 11 refills | Status: DC

## 2019-06-13 NOTE — Addendum Note (Signed)
Addended by: Levonne Hubert on: 06/13/2019 12:05 PM   Modules accepted: Orders

## 2019-06-13 NOTE — Patient Instructions (Signed)
Medication Instructions:  Your physician recommends that you continue on your current medications as directed. Please refer to the Current Medication list given to you today.  *If you need a refill on your cardiac medications before your next appointment, please call your pharmacy*  Lab Work: NONE   If you have labs (blood work) drawn today and your tests are completely normal, you will receive your results only by: . MyChart Message (if you have MyChart) OR . A paper copy in the mail If you have any lab test that is abnormal or we need to change your treatment, we will call you to review the results.  Testing/Procedures: NONE   Follow-Up: At [facility], you and your health needs are our priority.  As part of our continuing mission to provide you with exceptional heart care, we have created designated Provider Care Teams.  These Care Teams include your primary Cardiologist (physician) and Advanced Practice Providers (APPs -  Physician Assistants and Nurse Practitioners) who all work together to provide you with the care you need, when you need it.  Your next appointment:   6 month(s)  The format for your next appointment:   In Person  Provider:   Jonathan Branch, MD  Other Instructions Thank you for choosing [facility]!    

## 2019-06-13 NOTE — Progress Notes (Signed)
Virtual Visit via Telephone Note   This visit type was conducted due to national recommendations for restrictions regarding the COVID-19 Pandemic (e.g. social distancing) in an effort to limit this patient's exposure and mitigate transmission in our community.  Due to her co-morbid illnesses, this patient is at least at moderate risk for complications without adequate follow up.  This format is felt to be most appropriate for this patient at this time.  The patient did not have access to video technology/had technical difficulties with video requiring transitioning to audio format only (telephone).  All issues noted in this document were discussed and addressed.  No physical exam could be performed with this format.  Please refer to the patient's chart for her  consent to telehealth for [facility].   Date:  06/13/2019   ID:  Lauren, Baker 07/02/1941, MRN [id number]  Patient Location: Home Provider Location: Home  PCP:  Redmond School, MD  Cardiologist:  Carlyle Dolly, MD  Electrophysiologist:  None   Evaluation Performed:  Follow-Up Visit  Chief Complaint:  Follow up visit  History of Present Illness:    Lauren Baker is a 78 y.o. female seen today for follow up of the following medical problems.  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  - received single chamber medtronic ppm 10/25/2018    05/2019 device check normal function - no recent palpitatoins - compliant with meds. No bleding on eliquis.    2. HTN - compliant withmeds Home bp's 110s/80s     PMH 1. Afib 2. HTN   The patient does not have symptoms concerning for COVID-19 infection (fever, chills, cough, or new shortness of breath).    Past Medical History:  Diagnosis Date  . Chronic atrial fibrillation   . Hypertension    Past Surgical History:  Procedure Laterality Date  .  detatched retina    . PACEMAKER IMPLANT N/A 10/25/2018   Procedure: PACEMAKER IMPLANT;  Surgeon: Thompson Grayer, MD;  Location: [facility] CV [facility];  Service: Cardiovascular;  Laterality: N/A;     No outpatient medications have been marked as taking for the 06/13/19 encounter (Appointment) with Arnoldo Lenis, MD.     Allergies:   Patient has no known allergies.   Social History   Tobacco Use  . Smoking status: Never Smoker  . Smokeless tobacco: Never Used  Substance Use Topics  . Alcohol use: No  . Drug use: No     Family Hx: The patient's family history includes CVA in her mother; Cancer in her sister; Heart attack in her brother and sister; Hypertension in her sister.  ROS:   Please see the history of present illness.     All other systems reviewed and are negative.   Prior CV studies:   The following studies were reviewed today:  03/2018 echo Study Conclusions  - Left ventricle: The cavity size was normal. There was mild focal basal hypertrophy of the septum. Systolic function was normal. The estimated ejection fraction was in the range of 55% to 60%. Wall motion was normal; there were no regional wall motion abnormalities. - Aortic valve: There was mild regurgitation. - Mitral valve: Calcified annulus. There was mild regurgitation. - Left atrium: The atrium was moderately dilated. - Pulmonary arteries: Systolic pressure was mildly increased. PA peak pressure: 40 mm Hg (S).  Impressions:  - Normal LV systolic function; calcified aortic valve with mild AI; mild MR; moderate  LAE; mild TR with mild pulmonary hypertension.  10/2018 echo IMPRESSIONS   1. The left ventricle has hyperdynamic systolic function, with an ejection fraction of >65%. The cavity size was normal. There is mildly increased left ventricular wall thickness. Left ventricular diastolic function could not be evaluated secondary to  atrial fibrillation. 2. The right ventricle  has normal systolic function. The cavity was normal. There is no increase in right ventricular wall thickness. 3. Left atrial size was mildly dilated. 4. Mild thickening of the mitral valve leaflet. 5. The tricuspid valve is grossly normal. 6. The aortic valve is tricuspid Mild thickening of the aortic valve Aortic valve regurgitation is trivial by color flow Doppler. 7. The inferior vena cava was normal in size with <50% respiratory variability. 8. Vigorous LV systolic function; mild LVH; trace AI; mild MR; mild LAE; mild TR; moderately elevated pulmonary pressure.  Labs/Other Tests and Data Reviewed:    EKG:n/a  Recent Labs: 10/18/2018: TSH 2.481 10/19/2018: Magnesium 1.4 10/23/2018: ALT 38 10/26/2018: BUN 12; Creatinine, Ser 0.92; Hemoglobin 11.9; Platelets 162; Potassium 3.5; Sodium 138   Recent Lipid Panel No results found for: CHOL, TRIG, HDL, CHOLHDL, LDLCALC, LDLDIRECT  Wt Readings from Last 3 Encounters:  01/27/19 180 lb (81.6 kg)  10/25/18 193 lb 12.6 oz (87.9 kg)  07/02/18 188 lb (85.3 kg)     Objective:    Vital Signs:   Today's Vitals   06/13/19 1111  BP: 117/84  Pulse: 83  Height: 5\' 10"  (1.778 m)   Body mass index is 25.83 kg/m.  Normal affect. Normal speech pattern and tone. Comfortable, no apparent distress. No audible signs of SOB or wheezing.   ASSESSMENT & PLAN:     1. Afib/Tachy-brady syndrome - doing well s/p ppm. Recent normal device check, no symptoms - continue currentmeds  2. HTN -she is at goal, continue current meds  COVID-19 Education: The signs and symptoms of COVID-19 were discussed with the patient and how to seek care for testing (follow up with PCP or arrange E-visit).  The importance of social distancing was discussed today.  Time:   Today, I have spent 13 minutes with the patient with telehealth technology discussing the above problems.     Medication Adjustments/Labs and Tests Ordered: Current medicines are reviewed at  length with the patient today.  Concerns regarding medicines are outlined above.   Tests Ordered: No orders of the defined types were placed in this encounter.   Medication Changes: No orders of the defined types were placed in this encounter.   Follow Up:  Either In Person or Virtual in 6 month(s)  Signed, , MD  06/13/2019 7:50 AM    [facility]

## 2019-06-14 ENCOUNTER — Telehealth: Payer: Self-pay | Admitting: *Deleted

## 2019-06-21 NOTE — Telephone Encounter (Signed)
Error

## 2019-06-30 ENCOUNTER — Telehealth: Payer: Self-pay | Admitting: Cardiology

## 2019-06-30 ENCOUNTER — Emergency Department (HOSPITAL_COMMUNITY)
Admission: EM | Admit: 2019-06-30 | Discharge: 2019-06-30 | Disposition: A | Discharge status: Home / Self Care | Payer: Medicare HMO | Attending: Emergency Medicine | Admitting: Emergency Medicine

## 2019-06-30 ENCOUNTER — Encounter (HOSPITAL_COMMUNITY): Payer: Self-pay | Admitting: *Deleted

## 2019-06-30 ENCOUNTER — Other Ambulatory Visit: Payer: Self-pay

## 2019-06-30 DIAGNOSIS — Z950 Presence of cardiac pacemaker: Secondary | ICD-10-CM | POA: Diagnosis not present

## 2019-06-30 DIAGNOSIS — Z79899 Other long term (current) drug therapy: Secondary | ICD-10-CM | POA: Diagnosis not present

## 2019-06-30 DIAGNOSIS — I10 Essential (primary) hypertension: Secondary | ICD-10-CM | POA: Insufficient documentation

## 2019-06-30 DIAGNOSIS — Z7901 Long term (current) use of anticoagulants: Secondary | ICD-10-CM | POA: Diagnosis not present

## 2019-06-30 DIAGNOSIS — R040 Epistaxis: Secondary | ICD-10-CM

## 2019-06-30 LAB — CBC WITH DIFFERENTIAL/PLATELET
Abs Immature Granulocytes: 0.03 10*3/uL (ref 0.00–0.07)
Basophils Absolute: 0 10*3/uL (ref 0.0–0.1)
Basophils Relative: 1 %
Eosinophils Absolute: 0.1 10*3/uL (ref 0.0–0.5)
Eosinophils Relative: 1 %
HCT: 43.2 % (ref 36.0–46.0)
Hemoglobin: 14.3 g/dL (ref 12.0–15.0)
Immature Granulocytes: 0 %
Lymphocytes Relative: 13 %
Lymphs Abs: 1 10*3/uL (ref 0.7–4.0)
MCH: 31.3 pg (ref 26.0–34.0)
MCHC: 33.1 g/dL (ref 30.0–36.0)
MCV: 94.5 fL (ref 80.0–100.0)
Monocytes Absolute: 0.4 10*3/uL (ref 0.1–1.0)
Monocytes Relative: 6 %
Neutro Abs: 5.9 10*3/uL (ref 1.7–7.7)
Neutrophils Relative %: 79 %
Platelets: 231 10*3/uL (ref 150–400)
RBC: 4.57 MIL/uL (ref 3.87–5.11)
RDW: 13.3 % (ref 11.5–15.5)
WBC: 7.4 10*3/uL (ref 4.0–10.5)
nRBC: 0 % (ref 0.0–0.2)

## 2019-06-30 LAB — PROTIME-INR
INR: 1.4 — ABNORMAL HIGH (ref 0.8–1.2)
Prothrombin Time: 17.1 seconds — ABNORMAL HIGH (ref 11.4–15.2)

## 2019-06-30 LAB — BASIC METABOLIC PANEL
Anion gap: 7 (ref 5–15)
BUN: 33 mg/dL — ABNORMAL HIGH (ref 8–23)
CO2: 25 mmol/L (ref 22–32)
Calcium: 9.2 mg/dL (ref 8.9–10.3)
Chloride: 107 mmol/L (ref 98–111)
Creatinine, Ser: 0.9 mg/dL (ref 0.44–1.00)
GFR calc Af Amer: >60 mL/min (ref >60)
GFR calc non Af Amer: >60 mL/min (ref >60)
Glucose, Bld: 191 mg/dL — ABNORMAL HIGH (ref 70–99)
Potassium: 4.5 mmol/L (ref 3.5–5.1)
Sodium: 139 mmol/L (ref 135–145)

## 2019-06-30 MED ORDER — SILVER NITRATE-POT NITRATE 75-25 % EX MISC
2.0000 | Freq: Once | CUTANEOUS | Status: AC
  Administered 2019-06-30: 2 via TOPICAL
  Filled 2019-06-30: qty 20

## 2019-06-30 MED ORDER — OXYMETAZOLINE HCL 0.05 % NA SOLN
1.0000 | Freq: Once | NASAL | Status: AC
  Administered 2019-06-30: 1 via NASAL
  Filled 2019-06-30: qty 30

## 2019-06-30 NOTE — ED Notes (Signed)
Tammy, PA at bedside.  

## 2019-06-30 NOTE — Telephone Encounter (Signed)
Pt is on apixaban (ELIQUIS) 5 MG TABS tablet [[id number]]  And has been having a nose bleed for about an hour. Cannot get it to stop

## 2019-06-30 NOTE — Telephone Encounter (Signed)
Agree with ER evaluation. Ok to hold eliquis 3 days, if bleeding stops then can restart. Yes in the long run she needs to continue eliquis   Zandra Abts MD

## 2019-06-30 NOTE — Telephone Encounter (Signed)
Spoke with pt's daughter. She stated that pt woke up @ 4 am this morning with a nose bleed. It has bled on and off all day, and they are not able to get it to stop.  Pt is on eliquis. I advised pt's daughter to have her evaluated in the ED as they will need to do some lab work and they will be able to get her nose to stop bleeding.  She questioned if she needs to continue taking the eliquis. Will send to Dr. Harl Bowie as an Juluis Rainier.

## 2019-06-30 NOTE — ED Triage Notes (Signed)
Pt with left nosebleed since this morning, controlled at present. Pt on blood thinner Eliquis.

## 2019-06-30 NOTE — ED Provider Notes (Signed)
[facility] EMERGENCY DEPARTMENT Provider Note   CSN: [id number] Arrival date & time: 06/30/19  1426     History   Chief Complaint Chief Complaint  Patient presents with  . Epistaxis    HPI Graylin ShiverJudy W Litsey is a 78 y.o. female.     HPI   Graylin ShiverJudy W Sarti is a 78 y.o. female past medical history of hypertension and chronic atrial fibrillation without RVR, anticoagulated with Eliquis.  She presents to the Emergency Department complaining of bleeding from her left nostril since 430 this morning.  The bleeding has been intermittent since onset.  She states she contacted her cardiologist who advised her to come to the emergency room for further evaluation.  She has not tried any therapies prior to arrival.  She denies recent illness, sneezing, coughing, fever, chills, dizziness, headache or chest discomfort.  She denies history of frequent nosebleeds or recent changes to her Eliquis dosing.    Past Medical History:  Diagnosis Date  . Chronic atrial fibrillation (HCC)   . Hypertension     Patient Active Problem List   Diagnosis Date Noted  . Metabolic acidosis 10/19/2018  . Bradycardia 10/18/2018  . Acute respiratory failure with hypoxia (HCC)   . Shock, cardiogenic (HCC)   . Accidental overdose   . Chronic atrial fibrillation (HCC) 03/12/2018  . Essential hypertension 03/12/2018    Past Surgical History:  Procedure Laterality Date  . detatched retina    . 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      Home Medications    Prior to Admission medications   Medication Sig Start Date End Date Taking? Authorizing Provider  apixaban (ELIQUIS) 5 MG TABS tablet Take 1 tablet (5 mg total) by mouth 2 (two) times daily. 06/13/19  Yes Antoine PocheBranch, Jonathan F, MD  buPROPion (WELLBUTRIN) 100 MG tablet Take  100 mg by mouth 3 (three) times daily. 02/26/18  Yes [provider]  cholecalciferol (VITAMIN D) 1000 units tablet Take 1,000 Units by mouth daily.   Yes [provider]  clonazePAM (KLONOPIN) 0.5 MG tablet Take 0.5 mg by mouth 2 (two) times daily. 01/19/18  Yes [provider]  diltiazem (CARDIZEM CD) 300 MG 24 hr capsule TAKE 1 CAPSULE BY MOUTH DAILY. Patient taking differently: Take 300 mg by mouth every morning.  01/20/19  Yes Branch, Dorothe PeaJonathan F, MD  DULoxetine (CYMBALTA) 30 MG capsule Take 30 mg by mouth daily.  12/13/17  Yes [provider]  losartan (COZAAR) 50 MG tablet TAKE 1 TABLET BY MOUTH DAILY Patient taking differently: Take 50 mg by mouth every morning.  01/20/19  Yes Branch, Dorothe PeaJonathan F, MD  metoprolol (TOPROL-XL) 200 MG 24 hr tablet Take 1 tablet (200 mg total) by mouth daily. 06/13/19  Yes BranchDorothe Pea, Jonathan F, MD  QUEtiapine (SEROQUEL) 50 MG tablet Take 50 mg by mouth at bedtime.  02/26/18  Yes [provider]    Family History Family History  Problem Relation Age of Onset  . CVA Mother   . Hypertension Sister   . Heart attack Brother   . Heart attack Sister   . Cancer Sister     Social History Social History   Tobacco Use  . Smoking status: Never Smoker  . Smokeless tobacco: Never Used  Substance Use Topics  . Alcohol use: No  .  Drug use: No     Allergies   Patient has no known allergies.   Review of Systems Review of Systems  Constitutional: Negative for chills, fatigue and fever.  HENT: Positive for nosebleeds. Negative for congestion, dental problem, facial swelling, sore throat and trouble swallowing.   Respiratory: Negative for cough, chest tightness and shortness of breath.   Cardiovascular: Negative for chest pain.  Gastrointestinal: Negative for abdominal pain, nausea and vomiting.  Genitourinary: Negative for dysuria and flank pain.  Musculoskeletal: Negative for arthralgias and myalgias.  Skin: Negative  for rash.  Neurological: Negative for dizziness, syncope, weakness and headaches.     Physical Exam Updated Vital Signs BP (!) 142/91   Pulse 99   Temp (!) 96.8 F (36 C) (Temporal)   Resp 18   Ht 5\' 10"  (1.778 m)   Wt 81.6 kg   SpO2 96%   BMI 25.83 kg/m   Physical Exam Vitals signs and nursing note reviewed.  Constitutional:      General: She is not in acute distress.    Appearance: Normal appearance. She is not ill-appearing.  HENT:     Head: Atraumatic.     Nose: No nasal deformity or signs of injury.     Left Nostril: Epistaxis present.     Right Turbinates: Not swollen.     Left Turbinates: Not swollen.     Comments: Excoriations of the bilateral anterior nasal septum.  Mild bleeding noted from the left nare with surrounding dried blood to the outer nose.  Blood noted to the posterior oropharynx.  No clots.    Mouth/Throat:     Mouth: Mucous membranes are moist.  Neck:     Musculoskeletal: Normal range of motion. No muscular tenderness.  Cardiovascular:     Rate and Rhythm: Normal rate. Rhythm irregular.     Pulses: Normal pulses.  Pulmonary:     Effort: Pulmonary effort is normal.     Breath sounds: Normal breath sounds.  Musculoskeletal: Normal range of motion.     Left lower leg: No edema.  Skin:    General: Skin is warm.     Findings: No rash.  Neurological:     General: No focal deficit present.     Mental Status: She is alert.     Sensory: No sensory deficit.     Motor: No weakness.    ED Treatments / Results  Labs (all labs ordered are listed, but only abnormal results are displayed) Labs Reviewed  PROTIME-INR - Abnormal; Notable for the following components:      Result Value   Prothrombin Time 17.1 (*)    INR 1.4 (*)    All other components within normal limits  BASIC METABOLIC PANEL - Abnormal; Notable for the following components:   Glucose, Bld 191 (*)    BUN 33 (*)    All other components within normal limits  CBC WITH  DIFFERENTIAL/PLATELET    EKG None  Radiology No results found.  Procedures .Epistaxis Management  Date/Time: 06/30/2019 8:30 PM Performed by: 07/02/2019, PA-C Authorized by: Pauline Aus, PA-C   Consent:    Consent obtained:  Verbal   Consent given by:  Patient   Risks discussed:  Bleeding, nasal injury and pain   Alternatives discussed:  No treatment, alternative treatment and observation Procedure details:    Treatment site:  L septum   Treatment method:  Silver nitrate   Treatment complexity:  Limited   Treatment episode: initial   Post-procedure details:  Assessment:  Bleeding decreased   Patient tolerance of procedure:  Tolerated well, no immediate complications   (including critical care time)    Medications Ordered in ED Medications  oxymetazoline (AFRIN) 0.05 % nasal spray 1 spray (has no administration in time range)     Initial Impression / Assessment and Plan / ED Course  I have reviewed the triage vital signs and the nursing notes.  Pertinent labs & imaging results that were available during my care of the patient were reviewed by me and considered in my medical decision making (see chart for details).        Patient with mild left-sided epistaxis.  Currently anticoagulated with Eliquis.  Will obtain labs.  Will try Afrin nasal spray and observe.  Possible area to the anterior nasal septum that may require cauterization.  Pt also seen by Dr. Lacinda Axon and care plan discussed.   Patient has been observed for several hours here.  Small area of excoriation to the anterior septum was cauterized with silver nitrate.  Bleeding has improved but continues to have a small amount of oozing.  I have discussed placement of a nasal packing, but patient declines at this time.  I have advised her of risk of continued bleeding and aspiration.  She verbalized understanding of this and she agrees to return to the ER if the bleeding worsens or persists.  I have also  recommended that she withhold her Eliquis this evening.  She agrees to this plan and she will contact her cardiologist tomorrow morning for further direction of her Eliquis dosing.  Final Clinical Impressions(s) / ED Diagnoses   Final diagnoses:  Left-sided epistaxis    ED Discharge Orders    None       Bufford Lope 06/30/19 2146    Nat Christen, MD 07/03/19 2057

## 2019-06-30 NOTE — Discharge Instructions (Signed)
Do not take your Eliquis tonight.  You may use the Afrin nasal spray 1 to 2 sprays every 6 hours.  If the bleeding resumes, pinch her nose and hold firmly for at least 10 to 15 minutes.  If the bleeding becomes severe please return to the emergency department.  Contact Dr. Nelly Laurence office tomorrow morning regarding further dosing of your Eliquis.

## 2019-06-30 NOTE — ED Notes (Signed)
Pt trying to make decision at this time between getting nose packing as suggested by Tammy, PA or not.

## 2019-06-30 NOTE — ED Notes (Signed)
2 silver nitrate app given to [place], [place]

## 2019-07-01 NOTE — Telephone Encounter (Signed)
Spoke with daughter Marcie Bal who states that pt was seen in ER last night for nose bleed. Daughter reports that bleeding stopped after leaving the ER last night. They were told to call office today about advice on next with Eliquis. Daughter reports that she did not give pt any Eliquis on yesterday or today. Please advise.

## 2019-07-01 NOTE — Telephone Encounter (Signed)
Daughter notified and voiced understanding

## 2019-07-01 NOTE — Telephone Encounter (Signed)
Did ER refer her to ENT, if not please refer to Dr Benjamine Mola for nose bleeds. HOld eliquis until Monday, if no recurrent bleeding restart on Monday.   Zandra Abts MD

## 2019-07-01 NOTE — Telephone Encounter (Signed)
Received call from daughter Lauren Baker. Patient went to the ER [facility] last evening. She questioned if patient  needs to continue taking the eliquis.  Please call [phone].

## 2019-07-11 DIAGNOSIS — I4891 Unspecified atrial fibrillation: Secondary | ICD-10-CM | POA: Diagnosis not present

## 2019-07-11 DIAGNOSIS — I10 Essential (primary) hypertension: Secondary | ICD-10-CM | POA: Diagnosis not present

## 2019-07-11 DIAGNOSIS — F330 Major depressive disorder, recurrent, mild: Secondary | ICD-10-CM | POA: Diagnosis not present

## 2019-08-24 ENCOUNTER — Ambulatory Visit (INDEPENDENT_AMBULATORY_CARE_PROVIDER_SITE_OTHER): Payer: Medicare HMO | Admitting: *Deleted

## 2019-08-24 DIAGNOSIS — I482 Chronic atrial fibrillation, unspecified: Secondary | ICD-10-CM | POA: Diagnosis not present

## 2019-08-24 LAB — CUP PACEART REMOTE DEVICE CHECK
Battery Remaining Longevity: 183 mo
Battery Voltage: 3.17 V
Brady Statistic RV Percent Paced: 5.61 %
Date Time Interrogation Session: 20210112203341
Implantable Lead Implant Date: 20200316
Implantable Lead Location: 753860
Implantable Lead Model: 5076
Implantable Pulse Generator Implant Date: 20200316
Lead Channel Impedance Value: 380 Ohm
Lead Channel Impedance Value: 437 Ohm
Lead Channel Pacing Threshold Amplitude: 0.625 V
Lead Channel Pacing Threshold Pulse Width: 0.4 ms
Lead Channel Sensing Intrinsic Amplitude: 11.875 mV
Lead Channel Sensing Intrinsic Amplitude: 11.875 mV
Lead Channel Setting Pacing Amplitude: 2.5 V
Lead Channel Setting Pacing Pulse Width: 0.4 ms
Lead Channel Setting Sensing Sensitivity: 1.2 mV

## 2019-08-31 DIAGNOSIS — F329 Major depressive disorder, single episode, unspecified: Secondary | ICD-10-CM | POA: Diagnosis not present

## 2019-08-31 DIAGNOSIS — I10 Essential (primary) hypertension: Secondary | ICD-10-CM | POA: Diagnosis not present

## 2019-08-31 DIAGNOSIS — F419 Anxiety disorder, unspecified: Secondary | ICD-10-CM | POA: Diagnosis not present

## 2019-11-23 ENCOUNTER — Ambulatory Visit (INDEPENDENT_AMBULATORY_CARE_PROVIDER_SITE_OTHER): Payer: Medicare HMO | Admitting: *Deleted

## 2019-11-23 DIAGNOSIS — I482 Chronic atrial fibrillation, unspecified: Secondary | ICD-10-CM | POA: Diagnosis not present

## 2019-11-23 LAB — CUP PACEART REMOTE DEVICE CHECK
Battery Remaining Longevity: 180 mo
Battery Voltage: 3.14 V
Brady Statistic RV Percent Paced: 5.55 %
Date Time Interrogation Session: 20210414064424
Implantable Lead Implant Date: 20200316
Implantable Lead Location: 753860
Implantable Lead Model: 5076
Implantable Pulse Generator Implant Date: 20200316
Lead Channel Impedance Value: 361 Ohm
Lead Channel Impedance Value: 418 Ohm
Lead Channel Pacing Threshold Amplitude: 0.625 V
Lead Channel Pacing Threshold Pulse Width: 0.4 ms
Lead Channel Sensing Intrinsic Amplitude: 10.25 mV
Lead Channel Sensing Intrinsic Amplitude: 10.25 mV
Lead Channel Setting Pacing Amplitude: 2.5 V
Lead Channel Setting Pacing Pulse Width: 0.4 ms
Lead Channel Setting Sensing Sensitivity: 1.2 mV

## 2019-11-23 NOTE — Progress Notes (Signed)
PPM Remote  

## 2019-11-25 ENCOUNTER — Telehealth: Payer: Self-pay

## 2019-11-25 NOTE — Telephone Encounter (Signed)
Daughter would is asking if Dr.Branch is approving the COVID shots/rval  Please call daughter [phone]

## 2019-11-25 NOTE — Telephone Encounter (Signed)
Returned call to pt's daughter. She wanted you to review her mother's chart specifically and advise her if it is safe for "her mother" to get the vaccine with her health conditions. Please advise.

## 2019-11-25 NOTE — Telephone Encounter (Signed)
I looked over everything and strongl recommend the covid vaccine for the patient   Dominga Ferry MD

## 2019-11-28 NOTE — Telephone Encounter (Signed)
Pt's daughter made aware.

## 2020-01-03 ENCOUNTER — Telehealth: Payer: Self-pay | Admitting: Internal Medicine

## 2020-01-03 NOTE — Telephone Encounter (Signed)
Patients daughter wants patient to be scheduled for a virtual visit with Dr. Johney Frame. She states that her mother has not been vaccinated yet.

## 2020-01-12 ENCOUNTER — Other Ambulatory Visit: Payer: Self-pay | Admitting: Cardiology

## 2020-01-12 DIAGNOSIS — I10 Essential (primary) hypertension: Secondary | ICD-10-CM

## 2020-01-17 ENCOUNTER — Telehealth: Payer: Self-pay

## 2020-01-17 NOTE — Telephone Encounter (Signed)
Left a message regarding appt on 01/18/20.

## 2020-01-18 ENCOUNTER — Other Ambulatory Visit: Payer: Self-pay

## 2020-01-18 ENCOUNTER — Telehealth (INDEPENDENT_AMBULATORY_CARE_PROVIDER_SITE_OTHER): Payer: Medicare HMO | Admitting: Internal Medicine

## 2020-01-18 ENCOUNTER — Encounter: Payer: Self-pay | Admitting: Internal Medicine

## 2020-01-18 VITALS — BP 121/81 | HR 65 | Ht 70.0 in | Wt 180.0 lb

## 2020-01-18 DIAGNOSIS — D6869 Other thrombophilia: Secondary | ICD-10-CM

## 2020-01-18 DIAGNOSIS — I482 Chronic atrial fibrillation, unspecified: Secondary | ICD-10-CM | POA: Diagnosis not present

## 2020-01-18 DIAGNOSIS — I441 Atrioventricular block, second degree: Secondary | ICD-10-CM

## 2020-01-18 NOTE — Progress Notes (Signed)
Electrophysiology TeleHealth Note  Due to national recommendations of social distancing due to COVID 19, an audio telehealth visit is felt to be most appropriate for this patient at this time.  Verbal consent was obtained by me for the telehealth visit today.  The patient does not have capability for a virtual visit.  A phone visit is therefore required today.   Date:  01/18/2020   ID:  Lauren Baker, DOB Oct 07, 1940, MRN [id number]  Location: patient's home  Provider location:  [place]  Evaluation Performed: Follow-up visit  PCP:  Elfredia Nevins, MD   Electrophysiologist:  Dr Johney Frame  Chief Complaint:  palpitations  History of Present Illness:    Lauren Baker is a 79 y.o. female who presents via telehealth conferencing today.  Since last being seen in our clinic, the patient reports doing very well.  Today, she denies symptoms of palpitations, chest pain, shortness of breath,  lower extremity edema, dizziness, presyncope, or syncope.  The patient is otherwise without complaint today.     Past Medical History:  Diagnosis Date  . Chronic atrial fibrillation (HCC)   . Hypertension     Past Surgical History:  Procedure Laterality Date  . detatched retina    . 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 apixaban (ELIQUIS) 5 MG TABS tablet Take 1 tablet (5 mg total) by mouth 2 (two) times daily. 60 tablet 11  . buPROPion (WELLBUTRIN) 100 MG tablet Take 100 mg by mouth 3 (three) times daily.  6  . cholecalciferol (VITAMIN D) 1000 units tablet Take 1,000 Units by mouth daily.    . clonazePAM (KLONOPIN) 0.5 MG tablet Take 0.5 mg by mouth 2 (two) times daily.  0  . diltiazem (CARDIZEM CD) 300 MG 24 hr capsule TAKE 1 CAPSULE BY MOUTH DAILY. 90 capsule 2  . DULoxetine  (CYMBALTA) 30 MG capsule Take 30 mg by mouth daily.   3  . losartan (COZAAR) 50 MG tablet Take 1 tablet (50 mg total) by mouth every morning. 90 tablet 3  . metoprolol (TOPROL-XL) 200 MG 24 hr tablet Take 1 tablet (200 mg total) by mouth daily. 90 tablet 3  . QUEtiapine (SEROQUEL) 50 MG tablet Take 50 mg by mouth at bedtime.   5   No current facility-administered medications for this visit.    Allergies:   Patient has no known allergies.   Social History:  The patient  reports that she has never smoked. She has never used smokeless tobacco. She reports that she does not drink alcohol or use drugs.   ROS:  Please see the history of present illness.   All other systems are personally reviewed and negative.    Exam:    Vital Signs:  BP 121/81   Pulse 65   Ht 5\' 10"  (1.778 m)   Wt 180 lb (81.6 kg)   BMI 25.83 kg/m   Well sounding, alert and conversant   Labs/Other Tests and Data Reviewed:    Recent Labs: 06/30/2019: BUN 33; Creatinine, Ser 0.90; Hemoglobin 14.3; Platelets 231; Potassium 4.5; Sodium 139   Wt Readings from Last 3 Encounters:  01/18/20 180 lb (81.6 kg)  06/30/19 180 lb (81.6 kg)  01/27/19 180 lb (81.6 kg)     Last device remote is reviewed from [facility] PDF  which reveals normal device function,     ASSESSMENT & PLAN:    1.  Permanent afib Rate controlled On eliquis  2. Second degree AV block Remotes are up to date Normal device function V paced 5.6%  3. HTN Stable No change required today    Risks, benefits and potential toxicities for medications prescribed and/or refilled reviewed with patient today.   Follow-up:  with me in a year   Patient Risk:  after full review of this patients clinical status, I feel that they are at moderate risk at this time.  Today, I have spent 15 minutes with the patient with telehealth technology discussing arrhythmia management .    Army Fossa, MD  01/18/2020 3:54 PM     [place] [address] [phone] (office) [phone] (fax)

## 2020-02-22 LAB — CUP PACEART REMOTE DEVICE CHECK
Battery Remaining Longevity: 177 mo
Battery Voltage: 3.11 V
Brady Statistic RV Percent Paced: 5.03 %
Date Time Interrogation Session: 20210714052213
Implantable Lead Implant Date: 20200316
Implantable Lead Location: 753860
Implantable Lead Model: 5076
Implantable Pulse Generator Implant Date: 20200316
Lead Channel Impedance Value: 342 Ohm
Lead Channel Impedance Value: 418 Ohm
Lead Channel Pacing Threshold Amplitude: 0.625 V
Lead Channel Pacing Threshold Pulse Width: 0.4 ms
Lead Channel Sensing Intrinsic Amplitude: 11.125 mV
Lead Channel Sensing Intrinsic Amplitude: 11.125 mV
Lead Channel Setting Pacing Amplitude: 2.5 V
Lead Channel Setting Pacing Pulse Width: 0.4 ms
Lead Channel Setting Sensing Sensitivity: 1.2 mV

## 2020-02-24 DIAGNOSIS — F419 Anxiety disorder, unspecified: Secondary | ICD-10-CM | POA: Diagnosis not present

## 2020-05-10 DIAGNOSIS — I10 Essential (primary) hypertension: Secondary | ICD-10-CM | POA: Diagnosis not present

## 2020-05-10 DIAGNOSIS — I4891 Unspecified atrial fibrillation: Secondary | ICD-10-CM | POA: Diagnosis not present

## 2020-05-10 DIAGNOSIS — F330 Major depressive disorder, recurrent, mild: Secondary | ICD-10-CM | POA: Diagnosis not present

## 2020-05-23 ENCOUNTER — Ambulatory Visit (INDEPENDENT_AMBULATORY_CARE_PROVIDER_SITE_OTHER): Payer: Medicare HMO

## 2020-05-23 DIAGNOSIS — I441 Atrioventricular block, second degree: Secondary | ICD-10-CM | POA: Diagnosis not present

## 2020-05-25 LAB — CUP PACEART REMOTE DEVICE CHECK
Battery Remaining Longevity: 174 mo
Battery Voltage: 3.08 V
Brady Statistic RV Percent Paced: 3.83 %
Date Time Interrogation Session: 20211013064843
Implantable Lead Implant Date: 20200316
Implantable Lead Location: 753860
Implantable Lead Model: 5076
Implantable Pulse Generator Implant Date: 20200316
Lead Channel Impedance Value: 380 Ohm
Lead Channel Impedance Value: 437 Ohm
Lead Channel Pacing Threshold Amplitude: 0.625 V
Lead Channel Pacing Threshold Pulse Width: 0.4 ms
Lead Channel Sensing Intrinsic Amplitude: 11.25 mV
Lead Channel Sensing Intrinsic Amplitude: 11.25 mV
Lead Channel Setting Pacing Amplitude: 2.5 V
Lead Channel Setting Pacing Pulse Width: 0.4 ms
Lead Channel Setting Sensing Sensitivity: 1.2 mV

## 2020-05-28 NOTE — Progress Notes (Signed)
Remote pacemaker transmission.   

## 2020-06-09 DIAGNOSIS — I10 Essential (primary) hypertension: Secondary | ICD-10-CM | POA: Diagnosis not present

## 2020-06-09 DIAGNOSIS — F330 Major depressive disorder, recurrent, mild: Secondary | ICD-10-CM | POA: Diagnosis not present

## 2020-06-09 DIAGNOSIS — I4891 Unspecified atrial fibrillation: Secondary | ICD-10-CM | POA: Diagnosis not present

## 2020-06-22 DIAGNOSIS — F419 Anxiety disorder, unspecified: Secondary | ICD-10-CM | POA: Diagnosis not present

## 2020-06-22 DIAGNOSIS — E042 Nontoxic multinodular goiter: Secondary | ICD-10-CM | POA: Diagnosis not present

## 2020-06-22 DIAGNOSIS — I10 Essential (primary) hypertension: Secondary | ICD-10-CM | POA: Diagnosis not present

## 2020-06-22 DIAGNOSIS — I4891 Unspecified atrial fibrillation: Secondary | ICD-10-CM | POA: Diagnosis not present

## 2020-06-22 DIAGNOSIS — D6869 Other thrombophilia: Secondary | ICD-10-CM | POA: Diagnosis not present

## 2020-06-22 DIAGNOSIS — Z6826 Body mass index (BMI) 26.0-26.9, adult: Secondary | ICD-10-CM | POA: Diagnosis not present

## 2020-06-22 DIAGNOSIS — Z1389 Encounter for screening for other disorder: Secondary | ICD-10-CM | POA: Diagnosis not present

## 2020-06-22 DIAGNOSIS — Z1331 Encounter for screening for depression: Secondary | ICD-10-CM | POA: Diagnosis not present

## 2020-06-22 DIAGNOSIS — Z0001 Encounter for general adult medical examination with abnormal findings: Secondary | ICD-10-CM | POA: Diagnosis not present

## 2020-06-22 DIAGNOSIS — E663 Overweight: Secondary | ICD-10-CM | POA: Diagnosis not present

## 2020-07-10 ENCOUNTER — Other Ambulatory Visit: Payer: Self-pay | Admitting: Cardiology

## 2020-07-13 ENCOUNTER — Other Ambulatory Visit: Payer: Self-pay | Admitting: Cardiology

## 2020-07-13 DIAGNOSIS — I482 Chronic atrial fibrillation, unspecified: Secondary | ICD-10-CM

## 2020-08-08 IMAGING — CR PORTABLE CHEST - 1 VIEW
1 series · 1 of 1 positions shown · non-contrast
Comparison: 10/18/2018

CLINICAL DATA: Intubation

EXAM:
PORTABLE CHEST 1 VIEW

[portable]
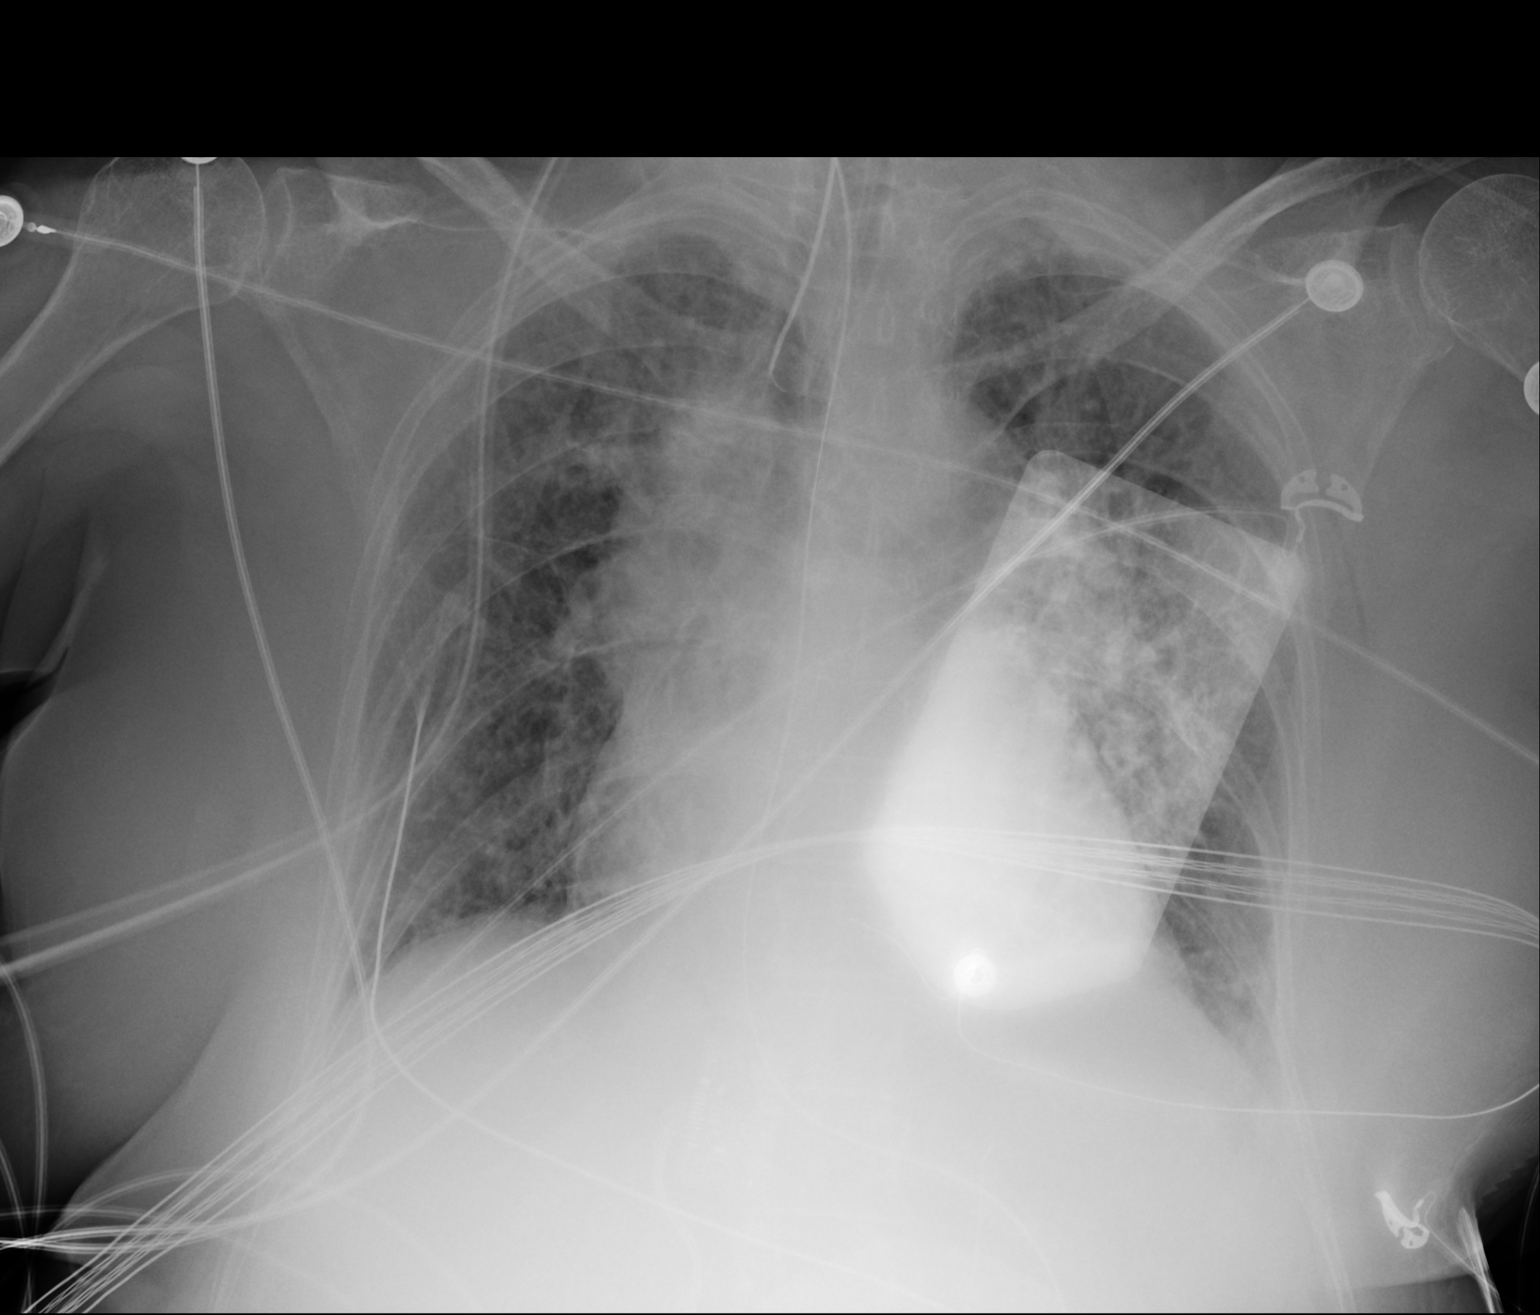

[1 of 1 positions shown; findings below may reference images not displayed]

FINDINGS: Endotracheal tube tip measures 3.7 cm above the carina. Enteric tube
tip is off field of view but below the left hemidiaphragm. Shallow
inspiration. Cardiac enlargement with mild vascular congestion.
Slight interstitial pattern to the lungs may indicate mild edema. No
focal consolidation. No blunting of costophrenic angles. No
pneumothorax.
IMPRESSION: Appliances appear in satisfactory position. Cardiac enlargement with
mild vascular congestion and mild interstitial edema.

## 2020-08-10 DIAGNOSIS — I10 Essential (primary) hypertension: Secondary | ICD-10-CM | POA: Diagnosis not present

## 2020-08-10 DIAGNOSIS — I4891 Unspecified atrial fibrillation: Secondary | ICD-10-CM | POA: Diagnosis not present

## 2020-08-10 DIAGNOSIS — F330 Major depressive disorder, recurrent, mild: Secondary | ICD-10-CM | POA: Diagnosis not present

## 2020-09-08 DIAGNOSIS — F330 Major depressive disorder, recurrent, mild: Secondary | ICD-10-CM | POA: Diagnosis not present

## 2020-09-08 DIAGNOSIS — I4891 Unspecified atrial fibrillation: Secondary | ICD-10-CM | POA: Diagnosis not present

## 2020-09-08 DIAGNOSIS — I10 Essential (primary) hypertension: Secondary | ICD-10-CM | POA: Diagnosis not present

## 2020-09-22 LAB — CUP PACEART REMOTE DEVICE CHECK
Battery Remaining Longevity: 170 mo
Battery Voltage: 3.05 V
Brady Statistic RV Percent Paced: 3.04 %
Date Time Interrogation Session: 20220211203829
Implantable Lead Implant Date: 20200316
Implantable Lead Location: 753860
Implantable Lead Model: 5076
Implantable Pulse Generator Implant Date: 20200316
Lead Channel Impedance Value: 361 Ohm
Lead Channel Impedance Value: 418 Ohm
Lead Channel Pacing Threshold Amplitude: 0.625 V
Lead Channel Pacing Threshold Pulse Width: 0.4 ms
Lead Channel Sensing Intrinsic Amplitude: 10.625 mV
Lead Channel Sensing Intrinsic Amplitude: 10.625 mV
Lead Channel Setting Pacing Amplitude: 2.5 V
Lead Channel Setting Pacing Pulse Width: 0.4 ms
Lead Channel Setting Sensing Sensitivity: 1.2 mV

## 2020-09-24 ENCOUNTER — Ambulatory Visit (INDEPENDENT_AMBULATORY_CARE_PROVIDER_SITE_OTHER): Payer: Medicare HMO

## 2020-09-24 DIAGNOSIS — I441 Atrioventricular block, second degree: Secondary | ICD-10-CM | POA: Diagnosis not present

## 2020-10-01 NOTE — Progress Notes (Signed)
Remote pacemaker transmission.   

## 2020-10-13 ENCOUNTER — Other Ambulatory Visit: Payer: Self-pay | Admitting: Cardiology

## 2020-10-13 DIAGNOSIS — I10 Essential (primary) hypertension: Secondary | ICD-10-CM

## 2020-11-07 DIAGNOSIS — F330 Major depressive disorder, recurrent, mild: Secondary | ICD-10-CM | POA: Diagnosis not present

## 2020-11-07 DIAGNOSIS — I4891 Unspecified atrial fibrillation: Secondary | ICD-10-CM | POA: Diagnosis not present

## 2020-11-07 DIAGNOSIS — I10 Essential (primary) hypertension: Secondary | ICD-10-CM | POA: Diagnosis not present

## 2020-11-27 DIAGNOSIS — F419 Anxiety disorder, unspecified: Secondary | ICD-10-CM | POA: Diagnosis not present

## 2020-12-08 DIAGNOSIS — I10 Essential (primary) hypertension: Secondary | ICD-10-CM | POA: Diagnosis not present

## 2020-12-08 DIAGNOSIS — I4891 Unspecified atrial fibrillation: Secondary | ICD-10-CM | POA: Diagnosis not present

## 2020-12-08 DIAGNOSIS — F330 Major depressive disorder, recurrent, mild: Secondary | ICD-10-CM | POA: Diagnosis not present

## 2020-12-24 ENCOUNTER — Ambulatory Visit (INDEPENDENT_AMBULATORY_CARE_PROVIDER_SITE_OTHER): Payer: Medicare HMO

## 2020-12-24 DIAGNOSIS — I441 Atrioventricular block, second degree: Secondary | ICD-10-CM

## 2020-12-25 LAB — CUP PACEART REMOTE DEVICE CHECK
Battery Remaining Longevity: 167 mo
Battery Voltage: 3.04 V
Brady Statistic RV Percent Paced: 2.85 %
Date Time Interrogation Session: 20220516004038
Implantable Lead Implant Date: 20200316
Implantable Lead Location: 753860
Implantable Lead Model: 5076
Implantable Pulse Generator Implant Date: 20200316
Lead Channel Impedance Value: 361 Ohm
Lead Channel Impedance Value: 418 Ohm
Lead Channel Pacing Threshold Amplitude: 0.625 V
Lead Channel Pacing Threshold Pulse Width: 0.4 ms
Lead Channel Sensing Intrinsic Amplitude: 10.75 mV
Lead Channel Sensing Intrinsic Amplitude: 10.75 mV
Lead Channel Setting Pacing Amplitude: 2.5 V
Lead Channel Setting Pacing Pulse Width: 0.4 ms
Lead Channel Setting Sensing Sensitivity: 1.2 mV

## 2021-01-14 ENCOUNTER — Other Ambulatory Visit: Payer: Self-pay | Admitting: Cardiology

## 2021-01-14 DIAGNOSIS — I482 Chronic atrial fibrillation, unspecified: Secondary | ICD-10-CM

## 2021-01-16 NOTE — Progress Notes (Signed)
Remote pacemaker transmission.   

## 2021-01-21 ENCOUNTER — Other Ambulatory Visit: Payer: Self-pay

## 2021-01-21 ENCOUNTER — Ambulatory Visit: Payer: Medicare HMO | Admitting: Internal Medicine

## 2021-01-21 ENCOUNTER — Encounter: Payer: Self-pay | Admitting: Internal Medicine

## 2021-01-21 VITALS — BP 128/72 | HR 60 | Ht 68.0 in | Wt 188.4 lb

## 2021-01-21 DIAGNOSIS — I10 Essential (primary) hypertension: Secondary | ICD-10-CM | POA: Diagnosis not present

## 2021-01-21 DIAGNOSIS — I4821 Permanent atrial fibrillation: Secondary | ICD-10-CM

## 2021-01-21 DIAGNOSIS — I441 Atrioventricular block, second degree: Secondary | ICD-10-CM | POA: Diagnosis not present

## 2021-01-21 DIAGNOSIS — R001 Bradycardia, unspecified: Secondary | ICD-10-CM | POA: Diagnosis not present

## 2021-01-21 NOTE — Progress Notes (Signed)
    PCP: Elfredia Nevins, MD   Primary EP:  Dr Johney Frame  Lauren Baker is a 80 y.o. female who presents today for routine electrophysiology followup.  Since last being seen in our clinic, the patient reports doing reasonably well.  She is not very active.   + SOB with moderate activity.  Today, she denies symptoms of palpitations, chest pain,   lower extremity edema, dizziness, presyncope, or syncope.  The patient is otherwise without complaint today.   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  6   cholecalciferol (VITAMIN D) 1000 units tablet Take 1,000 Units by mouth daily.     clonazePAM (KLONOPIN) 0.5 MG tablet Take 0.5 mg by mouth 2 (two) times daily.  0   diltiazem (CARDIZEM CD) 300 MG 24 hr capsule TAKE 1 CAPSULE BY MOUTH DAILY. 90 capsule 2   DULoxetine (CYMBALTA) 30 MG capsule Take 30 mg by mouth daily.   3   ELIQUIS 5 MG TABS tablet TAKE 1 TABLET BY MOUTH TWICE DAILY. 60 tablet 11   losartan (COZAAR) 50 MG tablet TAKE 1 TABLET BY MOUTH DAILY 90 tablet 1   metoprolol (TOPROL-XL) 200 MG 24 hr tablet TAKE 1 TABLET BY MOUTH DAILY 60 tablet 0   QUEtiapine (SEROQUEL) 50 MG tablet Take 50 mg by mouth at bedtime.   5   No current facility-administered medications for this visit.    Physical Exam: There were no vitals filed for this visit.  GEN- The patient is well appearing, alert and oriented x 3 today.   Head- normocephalic, atraumatic Eyes-  Sclera clear, conjunctiva  pink Ears- hearing intact Oropharynx- clear Lungs- Clear to ausculation bilaterally, normal work of breathing Chest- pacemaker pocket is well healed Heart- irregular rate and rhythm, no murmurs, rubs or gallops, PMI not laterally displaced GI- soft, NT, ND, + BS Extremities- no clubbing, cyanosis, or edema  Pacemaker interrogation- reviewed in detail today,  See PACEART report  ekg tracing ordered today is personally reviewed and shows afib, demand V pacing  Assessment and Plan:  1. Symptomatic second degree heart block Normal pacemaker function See Pace Art report No changes today she is not device dependant today  2. Permanent afib Rate controlled On eliquis  3. HTN Stable No change required today  Return to see EP PA annually I will see when needed  Hillis Range MD, [facility] 01/21/2021 2:10 PM

## 2021-01-21 NOTE — Patient Instructions (Signed)
Medication Instructions:  Your physician recommends that you continue on your current medications as directed. Please refer to the Current Medication list given to you today.  Labwork: None ordered.  Testing/Procedures: None ordered.  Follow-Up: Your physician wants you to follow-up in: 12 months with  James Allred, MD or one of the following Advanced Practice Providers on your designated Care Team:    Renee Ursuy, PA-C    You will receive a reminder letter in the mail two months in advance. If you don't receive a letter, please call our office to schedule the follow-up appointment.   Any Other Special Instructions Will Be Listed Below (If Applicable).  If you need a refill on your cardiac medications before your next appointment, please call your pharmacy.        

## 2021-01-22 NOTE — Addendum Note (Signed)
Addended by: Solon Augusta on: 01/22/2021 03:44 PM   Modules accepted: Orders

## 2021-02-08 ENCOUNTER — Ambulatory Visit: Payer: Medicare HMO | Admitting: Cardiology

## 2021-02-08 NOTE — Progress Notes (Deleted)
Clinical Summary Lauren Baker is a 80 y.o.female seen today for follow up of the following medical problems.    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 - received single chamber medtronic ppm 10/25/2018       05/2019 device check normal function - no recent palpitatoins - compliant with meds. No bleding on eliquis.      2. HTN - compliant withmeds Home bp's 110s/80s         PMH 1. Afib 2. HTN   Past Medical History:  Diagnosis Date   Chronic atrial fibrillation (HCC)    Hypertension      No Known Allergies   Current Outpatient Medications  Medication Sig Dispense Refill   buPROPion (WELLBUTRIN) 100 MG tablet Take 100 mg by mouth 3 (three) times daily.  6   cholecalciferol (VITAMIN D) 1000 units tablet Take 1,000 Units by mouth daily.     clonazePAM (KLONOPIN) 0.5 MG tablet Take 0.5 mg by mouth 2 (two) times daily.  0   diltiazem (CARDIZEM CD) 300 MG 24 hr capsule TAKE 1 CAPSULE BY MOUTH DAILY. 90 capsule 2   DULoxetine (CYMBALTA) 30 MG capsule Take 30 mg by mouth daily.   3   ELIQUIS 5 MG TABS tablet TAKE 1 TABLET BY MOUTH TWICE DAILY. 60 tablet 11   losartan (COZAAR) 50 MG tablet TAKE 1 TABLET BY MOUTH DAILY 90 tablet 1   metoprolol (TOPROL-XL) 200 MG 24 hr tablet TAKE 1 TABLET BY MOUTH DAILY 60 tablet 0   QUEtiapine (SEROQUEL) 50 MG tablet Take 50 mg by mouth at bedtime.   5   No current facility-administered medications for this visit.     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 She has never used smokeless tobacco. Lauren Baker reports no history of alcohol use.   Review of Systems CONSTITUTIONAL: No weight loss, fever, chills, weakness or fatigue.  HEENT: Eyes: No visual loss, blurred vision, double vision or yellow sclerae.No hearing loss, sneezing, congestion, runny nose or sore throat.  SKIN: No rash or itching.  CARDIOVASCULAR:  RESPIRATORY: No shortness of breath, cough or sputum.  GASTROINTESTINAL: No anorexia, nausea, vomiting or diarrhea. No abdominal pain or blood.  GENITOURINARY: No burning on urination, no polyuria NEUROLOGICAL: No headache, dizziness, syncope, paralysis, ataxia, numbness or tingling in the extremities. No change in bowel or bladder control.  MUSCULOSKELETAL: No muscle, back pain, joint pain or stiffness.  LYMPHATICS: No enlarged nodes. No history of splenectomy.  PSYCHIATRIC: No history of depression or anxiety.  ENDOCRINOLOGIC: No reports of sweating, cold or heat intolerance. No polyuria or polydipsia.  Marland Kitchen   Physical Examination There were no vitals filed for this visit. There were no vitals filed for this visit.  Gen: resting comfortably, no acute distress HEENT: no scleral icterus, pupils equal round and reactive, no palptable cervical adenopathy,  CV Resp: Clear to auscultation bilaterally  GI: abdomen is soft, non-tender, non-distended, normal bowel sounds, no hepatosplenomegaly MSK: extremities are warm, no edema.  Skin: warm, no rash Neuro:  no focal deficits Psych: appropriate affect   Diagnostic Studies  03/2018 echo Study Conclusions   - Left ventricle: The cavity size was normal. There was mild focal   basal hypertrophy of the septum. Systolic function was normal.   The estimated ejection fraction was in the range of 55% to 60%.   Wall motion was normal; there were no regional wall motion   abnormalities. - Aortic  valve: There was mild regurgitation. - Mitral valve: Calcified annulus. There was mild regurgitation. - Left atrium: The atrium was moderately dilated. - Pulmonary arteries: Systolic pressure was mildly increased. PA   peak pressure: 40 mm Hg (S).   Impressions:   - Normal LV systolic function; calcified aortic valve with mild AI;   mild MR; moderate LAE; mild TR with mild pulmonary hypertension.   10/2018 echo IMPRESSIONS      1. The left ventricle has hyperdynamic systolic function, with an ejection fraction of >65%. The cavity size was normal. There is mildly increased left ventricular wall thickness. Left ventricular diastolic function could not be evaluated secondary to atrial fibrillation.  2. The right ventricle has normal systolic function. The cavity was normal. There is no increase in right ventricular wall thickness.  3. Left atrial size was mildly dilated.  4. Mild thickening of the mitral valve leaflet.  5. The tricuspid valve is grossly normal.  6. The aortic valve is tricuspid Mild thickening of the aortic valve Aortic valve regurgitation is trivial by color flow Doppler.  7. The inferior vena cava was normal in size with <50% respiratory variability.  8. Vigorous LV systolic function; mild LVH; trace AI; mild MR; mild LAE; mild TR; moderately elevated pulmonary pressure.     Assessment and Plan   1. Afib/Tachy-brady syndrome - doing well s/p ppm. Recent normal device check, no symptoms - continue currentmeds   2. HTN -she is at goal, continue current meds     Antoine Poche, M.D., F.A.C.C.

## 2021-03-14 ENCOUNTER — Other Ambulatory Visit: Payer: Self-pay | Admitting: Cardiology

## 2021-03-14 DIAGNOSIS — I482 Chronic atrial fibrillation, unspecified: Secondary | ICD-10-CM

## 2021-03-25 ENCOUNTER — Ambulatory Visit (INDEPENDENT_AMBULATORY_CARE_PROVIDER_SITE_OTHER): Payer: Medicare HMO

## 2021-03-25 DIAGNOSIS — I441 Atrioventricular block, second degree: Secondary | ICD-10-CM | POA: Diagnosis not present

## 2021-03-27 LAB — CUP PACEART REMOTE DEVICE CHECK
Battery Remaining Longevity: 164 mo
Battery Voltage: 3.03 V
Brady Statistic RV Percent Paced: 6.55 %
Date Time Interrogation Session: 20220815004113
Implantable Lead Implant Date: 20200316
Implantable Lead Location: 753860
Implantable Lead Model: 5076
Implantable Pulse Generator Implant Date: 20200316
Lead Channel Impedance Value: 361 Ohm
Lead Channel Impedance Value: 475 Ohm
Lead Channel Pacing Threshold Amplitude: 0.75 V
Lead Channel Pacing Threshold Pulse Width: 0.4 ms
Lead Channel Sensing Intrinsic Amplitude: 11.25 mV
Lead Channel Sensing Intrinsic Amplitude: 11.25 mV
Lead Channel Setting Pacing Amplitude: 2.5 V
Lead Channel Setting Pacing Pulse Width: 0.4 ms
Lead Channel Setting Sensing Sensitivity: 1.2 mV

## 2021-04-12 NOTE — Progress Notes (Signed)
Remote pacemaker transmission.   

## 2021-04-29 DIAGNOSIS — E663 Overweight: Secondary | ICD-10-CM | POA: Diagnosis not present

## 2021-04-29 DIAGNOSIS — I10 Essential (primary) hypertension: Secondary | ICD-10-CM | POA: Diagnosis not present

## 2021-04-29 DIAGNOSIS — F419 Anxiety disorder, unspecified: Secondary | ICD-10-CM | POA: Diagnosis not present

## 2021-04-29 DIAGNOSIS — I4891 Unspecified atrial fibrillation: Secondary | ICD-10-CM | POA: Diagnosis not present

## 2021-04-29 DIAGNOSIS — Z6826 Body mass index (BMI) 26.0-26.9, adult: Secondary | ICD-10-CM | POA: Diagnosis not present

## 2021-06-10 ENCOUNTER — Telehealth: Payer: Self-pay | Admitting: Cardiology

## 2021-06-10 NOTE — Telephone Encounter (Signed)
  Pt's daughter is requesting to change pt's appt on 06/14/21 to virtual appt. She said she is going through chemo right now and pt is not covid vaccinated.

## 2021-06-10 NOTE — Telephone Encounter (Signed)
Spoke with caregiver and she states that she is undergoing chemo and would like appt changed to telephone visit.

## 2021-06-14 ENCOUNTER — Encounter: Payer: Self-pay | Admitting: Cardiology

## 2021-06-14 ENCOUNTER — Other Ambulatory Visit: Payer: Self-pay

## 2021-06-14 ENCOUNTER — Telehealth: Payer: Self-pay

## 2021-06-14 ENCOUNTER — Ambulatory Visit (INDEPENDENT_AMBULATORY_CARE_PROVIDER_SITE_OTHER): Payer: Medicare HMO | Admitting: Cardiology

## 2021-06-14 ENCOUNTER — Telehealth: Payer: Self-pay | Admitting: Cardiology

## 2021-06-14 VITALS — BP 114/78 | HR 80 | Ht 70.5 in | Wt 189.0 lb

## 2021-06-14 DIAGNOSIS — I495 Sick sinus syndrome: Secondary | ICD-10-CM | POA: Diagnosis not present

## 2021-06-14 DIAGNOSIS — I10 Essential (primary) hypertension: Secondary | ICD-10-CM

## 2021-06-14 MED ORDER — LOSARTAN POTASSIUM 50 MG PO TABS: 50.0000 mg | ORAL_TABLET | Freq: Every day | ORAL | 3 refills | Status: DC

## 2021-06-14 NOTE — Patient Instructions (Signed)
Medication Instructions:  Your physician recommends that you continue on your current medications as directed. Please refer to the Current Medication list given to you today.  *If you need a refill on your cardiac medications before your next appointment, please call your pharmacy*   Lab Work: We have requested lab work from your primary care provider If you have labs (blood work) drawn today and your tests are completely normal, you will receive your results only by: MyChart Message (if you have MyChart) OR A paper copy in the mail If you have any lab test that is abnormal or we need to change your treatment, we will call you to review the results.   Testing/Procedures: Noen   Follow-Up: At [facility], you and your health needs are our priority.  As part of our continuing mission to provide you with exceptional heart care, we have created designated Provider Care Teams.  These Care Teams include your primary Cardiologist (physician) and Advanced Practice Providers (APPs -  Physician Assistants and Nurse Practitioners) who all work together to provide you with the care you need, when you need it.  We recommend signing up for the patient portal called "MyChart".  Sign up information is provided on this After Visit Summary.  MyChart is used to connect with patients for Virtual Visits (Telemedicine).  Patients are able to view lab/test results, encounter notes, upcoming appointments, etc.  Non-urgent messages can be sent to your provider as well.   To learn more about what you can do with MyChart, go to [email or web address].    Your next appointment:   1 year(s)  The format for your next appointment:   In Person  Provider:   Dina Rich, MD    Other Instructions

## 2021-06-14 NOTE — Telephone Encounter (Signed)
Per Dr. Wyline Mood, pt needs Eliquis refills x30 for 1 year.

## 2021-06-14 NOTE — Telephone Encounter (Signed)
Noted, thanks!

## 2021-06-14 NOTE — Telephone Encounter (Signed)
  Marylu Lund calling to provide vital signs for the pt, she said she wanted to give it prior the pt's virtual appt today

## 2021-06-14 NOTE — Progress Notes (Signed)
Virtual Visit via Telephone Note   This visit type was conducted due to national recommendations for restrictions regarding the COVID-19 Pandemic (e.g. social distancing) in an effort to limit this patient's exposure and mitigate transmission in our community.  Due to her co-morbid illnesses, this patient is at least at moderate risk for complications without adequate follow up.  This format is felt to be most appropriate for this patient at this time.  The patient did not have access to video technology/had technical difficulties with video requiring transitioning to audio format only (telephone).  All issues noted in this document were discussed and addressed.  No physical exam could be performed with this format.  Please refer to the patient's chart for her  consent to telehealth for [facility].    Date:  06/14/2021   ID:  Lauren Baker, DOB 1941/08/06, MRN [id number] The patient was identified using 2 identifiers.  Patient Location: Home Provider Location: Office/Clinic   PCP:  Elfredia Nevins, MD   [facility] Providers Cardiologist:  Dina Rich, MD     Evaluation Performed:  Follow-Up Visit  Chief Complaint:  Follow up visit  History of Present Illness:    Lauren Baker is a 80 y.o. female seen today for follow up of the following medical problems.    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  - received single chamber medtronic ppm 10/25/2018     - no recent palpitations - compliant with meds - no recent bleeding on eliquis - 03/2021 normal device check.     2. HTN - she is compliant with meds       SH: daughter history of breast cancer, she is on chemo  The patient does not have symptoms concerning for COVID-19 infection (fever, chills, cough, or new shortness of breath).    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     Allergies:   Patient has no known allergies.   Social History   Tobacco Use   Smoking status: Never   Smokeless tobacco: Never  Vaping Use   Vaping Use: Never used  Substance Use Topics   Alcohol use: No   Drug use: No     Family Hx: The patient's family history includes CVA in her mother; Cancer in her sister; Heart attack in her brother and sister; Hypertension in her sister.  ROS:   Please see the history of present illness.     All other systems reviewed and are negative.   Prior CV studies:   The following studies were reviewed today:  03/2018 echo Study Conclusions   - Left ventricle: The cavity size was normal. There was mild focal   basal hypertrophy of the septum. Systolic function was normal.   The estimated ejection fraction was in the range of 55% to 60%.   Wall motion was normal; there were no regional wall motion   abnormalities. - Aortic valve: There was mild regurgitation. - Mitral valve: Calcified annulus. There was mild regurgitation. -  Left atrium: The atrium was moderately dilated. - Pulmonary arteries: Systolic pressure was mildly increased. PA   peak pressure: 40 mm Hg (S).   Impressions:   - Normal LV systolic function; calcified aortic valve with mild AI;   mild MR; moderate LAE; mild TR with mild pulmonary hypertension.   10/2018 echo IMPRESSIONS      1. The left ventricle has hyperdynamic systolic function, with an ejection fraction of >65%. The cavity size was normal. There is mildly increased left ventricular wall thickness.  Left ventricular diastolic function could not be evaluated secondary to  atrial fibrillation.  2. The right ventricle has normal systolic function. The cavity was normal. There is no increase in right ventricular wall thickness.  3. Left atrial size was mildly dilated.  4. Mild thickening of the mitral valve leaflet.  5. The tricuspid valve is grossly normal.  6. The aortic valve is tricuspid Mild thickening of the aortic valve Aortic valve regurgitation is trivial by color flow Doppler.  7. The inferior vena cava was normal in size with <50% respiratory variability.  8. Vigorous LV systolic function; mild LVH; trace AI; mild MR; mild LAE; mild TR; moderately elevated pulmonary pressure.  Labs/Other Tests and Data Reviewed:    EKG:  No ECG reviewed.  Recent Labs: No results found for requested labs within last 8760 hours.   Recent Lipid Panel No results found for: CHOL, TRIG, HDL, CHOLHDL, LDLCALC, LDLDIRECT  Wt Readings from Last 3 Encounters:  01/21/21 188 lb 6.4 oz (85.5 kg)  01/18/20 180 lb (81.6 kg)  06/30/19 180 lb (81.6 kg)     Risk Assessment/Calculations:          Objective:    Vital Signs:   Today's Vitals   06/14/21 1047  BP: 114/78  Pulse: 80  Weight: 189 lb (85.7 kg)  Height: 5' 10.5" (1.791 m)   Body mass index is 26.74 kg/m. Normal affect. Normal speech pattern and tone. Comfortable, no apparent distress. No audible signs of sob or wheezing.   ASSESSMENT & PLAN:    1. Afib/Tachy-brady syndrome - doing well s/p ppm. No recent symptoms, upcoming device check last this month - continue current meds including anticoagulation.    2. HTN -remains at goal, continue current meds            COVID-19 Education: The signs and symptoms of COVID-19 were discussed with the patient and how to seek care for testing (follow up with PCP or arrange E-visit).  The importance of social distancing was discussed today.  Time:   Today, I have spent 15 minutes  with the patient with telehealth technology discussing the above problems.     Medication Adjustments/Labs and Tests Ordered: Current medicines are reviewed at length with the patient today.  Concerns regarding medicines are outlined above.   Tests Ordered: No orders of the defined types were placed in this encounter.   Medication Changes: No orders of the defined types were placed in this encounter.   Follow Up:  In Person 1 year  Signed, Dina Rich, MD  06/14/2021 8:16 AM    [facility]

## 2021-06-17 ENCOUNTER — Other Ambulatory Visit: Payer: Self-pay

## 2021-06-17 DIAGNOSIS — I4821 Permanent atrial fibrillation: Secondary | ICD-10-CM

## 2021-06-17 NOTE — Telephone Encounter (Signed)
Past due for labs. Need lab current labs in order to refill medication.  Needs CBC/BMP ordered or results if had any recently.  None in [facility] or Care Everywhere. Thanks

## 2021-06-17 NOTE — Telephone Encounter (Signed)
I will inform pt she needs new labs drawn. Thank you!

## 2021-06-17 NOTE — Telephone Encounter (Signed)
Labs are being faxed to [facility]. Lab work was requested 06/14/2021 from PCP.

## 2021-06-17 NOTE — Telephone Encounter (Signed)
Is patient getting labs redrawn?  These are from 06/22/20 and to old.

## 2021-06-24 ENCOUNTER — Ambulatory Visit (INDEPENDENT_AMBULATORY_CARE_PROVIDER_SITE_OTHER): Payer: Medicare HMO

## 2021-06-24 DIAGNOSIS — I495 Sick sinus syndrome: Secondary | ICD-10-CM

## 2021-06-24 LAB — CUP PACEART REMOTE DEVICE CHECK
Battery Remaining Longevity: 161 mo
Battery Voltage: 3.03 V
Brady Statistic RV Percent Paced: 8.47 %
Date Time Interrogation Session: 20221113234936
Implantable Lead Implant Date: 20200316
Implantable Lead Location: 753860
Implantable Lead Model: 5076
Implantable Pulse Generator Implant Date: 20200316
Lead Channel Impedance Value: 399 Ohm
Lead Channel Impedance Value: 437 Ohm
Lead Channel Pacing Threshold Amplitude: 0.625 V
Lead Channel Pacing Threshold Pulse Width: 0.4 ms
Lead Channel Sensing Intrinsic Amplitude: 10.125 mV
Lead Channel Sensing Intrinsic Amplitude: 10.125 mV
Lead Channel Setting Pacing Amplitude: 2.5 V
Lead Channel Setting Pacing Pulse Width: 0.4 ms
Lead Channel Setting Sensing Sensitivity: 1.2 mV

## 2021-07-01 DIAGNOSIS — Z6826 Body mass index (BMI) 26.0-26.9, adult: Secondary | ICD-10-CM | POA: Diagnosis not present

## 2021-07-01 DIAGNOSIS — F419 Anxiety disorder, unspecified: Secondary | ICD-10-CM | POA: Diagnosis not present

## 2021-07-01 DIAGNOSIS — Z1331 Encounter for screening for depression: Secondary | ICD-10-CM | POA: Diagnosis not present

## 2021-07-01 DIAGNOSIS — E663 Overweight: Secondary | ICD-10-CM | POA: Diagnosis not present

## 2021-07-01 DIAGNOSIS — Z0000 Encounter for general adult medical examination without abnormal findings: Secondary | ICD-10-CM | POA: Diagnosis not present

## 2021-07-01 DIAGNOSIS — E538 Deficiency of other specified B group vitamins: Secondary | ICD-10-CM | POA: Diagnosis not present

## 2021-07-01 DIAGNOSIS — I10 Essential (primary) hypertension: Secondary | ICD-10-CM | POA: Diagnosis not present

## 2021-07-01 DIAGNOSIS — Z13220 Encounter for screening for lipoid disorders: Secondary | ICD-10-CM | POA: Diagnosis not present

## 2021-07-01 DIAGNOSIS — Z0001 Encounter for general adult medical examination with abnormal findings: Secondary | ICD-10-CM | POA: Diagnosis not present

## 2021-07-01 DIAGNOSIS — E559 Vitamin D deficiency, unspecified: Secondary | ICD-10-CM | POA: Diagnosis not present

## 2021-07-01 DIAGNOSIS — I4891 Unspecified atrial fibrillation: Secondary | ICD-10-CM | POA: Diagnosis not present

## 2021-07-02 NOTE — Progress Notes (Signed)
Remote pacemaker transmission.   

## 2021-07-03 ENCOUNTER — Other Ambulatory Visit: Payer: Self-pay | Admitting: Cardiology

## 2021-07-03 NOTE — Telephone Encounter (Addendum)
Prescription refill request for Eliquis received. Indication: afib  Last office visit: Branch, 06/14/2021 Scr: 1.15, 06/22/2020 Age: 80 yo  Weight: 85.7kg  Called and spoke to pt's daughter who stated that pt got blood work done at [facility]. Called pt's PCP who stated that they would fax over copy of the blood work.

## 2021-07-03 NOTE — Telephone Encounter (Signed)
   Pt's daughter calling back, she said pt needs her eliquis today because she will be out of meds this weekend and [place] will close at 6pm today

## 2021-07-03 NOTE — Telephone Encounter (Signed)
Scr 1.1 07/01/2021- faxed from PCP

## 2021-07-08 DIAGNOSIS — E538 Deficiency of other specified B group vitamins: Secondary | ICD-10-CM | POA: Diagnosis not present

## 2021-08-07 DIAGNOSIS — E538 Deficiency of other specified B group vitamins: Secondary | ICD-10-CM | POA: Diagnosis not present

## 2021-09-10 DIAGNOSIS — I10 Essential (primary) hypertension: Secondary | ICD-10-CM | POA: Diagnosis not present

## 2021-09-10 DIAGNOSIS — F330 Major depressive disorder, recurrent, mild: Secondary | ICD-10-CM | POA: Diagnosis not present

## 2021-09-10 DIAGNOSIS — I4891 Unspecified atrial fibrillation: Secondary | ICD-10-CM | POA: Diagnosis not present

## 2021-09-13 DIAGNOSIS — E538 Deficiency of other specified B group vitamins: Secondary | ICD-10-CM | POA: Diagnosis not present

## 2021-09-20 DIAGNOSIS — E538 Deficiency of other specified B group vitamins: Secondary | ICD-10-CM | POA: Diagnosis not present

## 2021-09-20 DIAGNOSIS — E039 Hypothyroidism, unspecified: Secondary | ICD-10-CM | POA: Diagnosis not present

## 2021-09-20 DIAGNOSIS — E559 Vitamin D deficiency, unspecified: Secondary | ICD-10-CM | POA: Diagnosis not present

## 2021-09-23 ENCOUNTER — Ambulatory Visit (INDEPENDENT_AMBULATORY_CARE_PROVIDER_SITE_OTHER): Payer: Medicare HMO

## 2021-09-23 DIAGNOSIS — I441 Atrioventricular block, second degree: Secondary | ICD-10-CM | POA: Diagnosis not present

## 2021-09-23 LAB — CUP PACEART REMOTE DEVICE CHECK
Battery Remaining Longevity: 158 mo
Battery Voltage: 3.03 V
Brady Statistic RV Percent Paced: 9.67 %
Date Time Interrogation Session: 20230212233249
Implantable Lead Implant Date: 20200316
Implantable Lead Location: 753860
Implantable Lead Model: 5076
Implantable Pulse Generator Implant Date: 20200316
Lead Channel Impedance Value: 399 Ohm
Lead Channel Impedance Value: 437 Ohm
Lead Channel Pacing Threshold Amplitude: 0.625 V
Lead Channel Pacing Threshold Pulse Width: 0.4 ms
Lead Channel Sensing Intrinsic Amplitude: 11.375 mV
Lead Channel Sensing Intrinsic Amplitude: 11.375 mV
Lead Channel Setting Pacing Amplitude: 2.5 V
Lead Channel Setting Pacing Pulse Width: 0.4 ms
Lead Channel Setting Sensing Sensitivity: 1.2 mV

## 2021-09-26 NOTE — Progress Notes (Signed)
Remote pacemaker transmission.   

## 2021-10-10 DIAGNOSIS — E538 Deficiency of other specified B group vitamins: Secondary | ICD-10-CM | POA: Diagnosis not present

## 2021-11-11 DIAGNOSIS — E538 Deficiency of other specified B group vitamins: Secondary | ICD-10-CM | POA: Diagnosis not present

## 2021-12-11 DIAGNOSIS — E538 Deficiency of other specified B group vitamins: Secondary | ICD-10-CM | POA: Diagnosis not present

## 2021-12-15 ENCOUNTER — Emergency Department (HOSPITAL_COMMUNITY)
Admission: EM | Admit: 2021-12-15 | Discharge: 2021-12-15 | Disposition: A | Discharge status: Home / Self Care | Payer: Medicare HMO | Attending: Emergency Medicine | Admitting: Emergency Medicine

## 2021-12-15 ENCOUNTER — Encounter (HOSPITAL_COMMUNITY): Payer: Self-pay | Admitting: Emergency Medicine

## 2021-12-15 ENCOUNTER — Emergency Department (HOSPITAL_COMMUNITY): Payer: Medicare HMO

## 2021-12-15 DIAGNOSIS — M25532 Pain in left wrist: Secondary | ICD-10-CM | POA: Diagnosis not present

## 2021-12-15 DIAGNOSIS — S6292XA Unspecified fracture of left wrist and hand, initial encounter for closed fracture: Secondary | ICD-10-CM | POA: Diagnosis not present

## 2021-12-15 DIAGNOSIS — Z7901 Long term (current) use of anticoagulants: Secondary | ICD-10-CM | POA: Insufficient documentation

## 2021-12-15 DIAGNOSIS — I10 Essential (primary) hypertension: Secondary | ICD-10-CM | POA: Diagnosis not present

## 2021-12-15 DIAGNOSIS — Z79899 Other long term (current) drug therapy: Secondary | ICD-10-CM | POA: Diagnosis not present

## 2021-12-15 DIAGNOSIS — S52502A Unspecified fracture of the lower end of left radius, initial encounter for closed fracture: Secondary | ICD-10-CM | POA: Diagnosis not present

## 2021-12-15 DIAGNOSIS — S62102A Fracture of unspecified carpal bone, left wrist, initial encounter for closed fracture: Secondary | ICD-10-CM

## 2021-12-15 DIAGNOSIS — W1839XA Other fall on same level, initial encounter: Secondary | ICD-10-CM | POA: Insufficient documentation

## 2021-12-15 DIAGNOSIS — S6992XA Unspecified injury of left wrist, hand and finger(s), initial encounter: Secondary | ICD-10-CM | POA: Diagnosis present

## 2021-12-15 MED ORDER — ACETAMINOPHEN 500 MG PO TABS: 500.0000 mg | ORAL_TABLET | ORAL | 0 refills | Status: AC | PRN

## 2021-12-15 MED ORDER — ACETAMINOPHEN 325 MG PO TABS
650.0000 mg | ORAL_TABLET | Freq: Once | ORAL | Status: AC
  Administered 2021-12-15: 650 mg via ORAL
  Filled 2021-12-15: qty 2

## 2021-12-15 NOTE — ED Provider Notes (Signed)
?[facility] EMERGENCY DEPARTMENT ?Provider Note ? ? ?CSN: [id number] ?Arrival date & time: 12/15/21  4098 ? ?  ? ?History ? ?Chief Complaint  ?Patient presents with  ? Wrist Pain  ? ? ?Lauren Baker is a 81 y.o. female. ? ?HPI ? ?  ? ?81 year old female comes in with chief complaint of fall and resultant wrist pain. ? ?Patient had a mechanical fall yesterday.  She denies any head trauma, headaches, nausea, vision change, dizziness, focal weakness or numbness.  She also denies any neck pain. ? ?Patient has been able to ambulate since the fall, but she is having severe wrist pain on the left side.  Patient is right-handed dominant. ? ?Home Medications ?Prior to Admission medications   ?Medication Sig Start Date End Date Taking? Authorizing Provider  ?acetaminophen (TYLENOL) 500 MG tablet Take 1 tablet (500 mg total) by mouth every 4 (four) hours as needed for up to 5 days. 12/15/21 12/20/21 Yes Madysin Crisp, MD  ?apixaban (ELIQUIS) 5 MG TABS tablet TAKE 1 TABLET BY MOUTH TWICE DAILY. 07/03/21  Yes BranchDorothe Pea, MD  ?buPROPion (WELLBUTRIN) 100 MG tablet Take 100 mg by mouth 3 (three) times daily. 02/26/18  Yes [provider]  ?cholecalciferol (VITAMIN D) 1000 units tablet Take 1,000 Units by mouth daily.   Yes [provider]  ?clonazePAM (KLONOPIN) 0.5 MG tablet Take 0.5 mg by mouth 2 (two) times daily. 01/19/18  Yes [provider]  ?diltiazem (CARDIZEM CD) 300 MG 24 hr capsule TAKE 1 CAPSULE BY MOUTH DAILY. 01/20/19  Yes BranchDorothe Pea, MD  ?DULoxetine (CYMBALTA) 30 MG capsule Take 30 mg by mouth daily.  12/13/17  Yes [provider]  ?losartan (COZAAR) 50 MG tablet Take 1 tablet (50 mg total) by mouth daily. 06/14/21  Yes BranchDorothe Pea, MD  ?metoprolol (TOPROL-XL) 200 MG 24 hr tablet TAKE 1 TABLET BY MOUTH DAILY 03/15/21  Yes Branch, Dorothe Pea, MD  ?QUEtiapine (SEROQUEL) 50 MG tablet Take 50 mg by mouth at bedtime.  02/26/18  Yes [provider]  ?    ? ?Allergies    ?Patient has no known allergies.   ? ?Review of Systems   ?Review of Systems ? ?Physical Exam ?Updated Vital Signs ?BP 127/85   Pulse 78   Temp 98 ?F (36.7 ?C) (Oral)   Resp 17   Ht 5' 10.5" (1.791 m)   Wt 85.7 kg   SpO2 99%   BMI 26.73 kg/m?  ?Physical Exam ?Vitals and nursing note reviewed.  ?Constitutional:   ?   Appearance: She is well-developed.  ?HENT:  ?   Head: Atraumatic.  ?Neck:  ?   Comments: No midline c-spine tenderness, pt able to turn head to 45 degrees bilaterally without any pain and able to flex neck to the chest and extend without any pain or neurologic symptoms. ? ?Cardiovascular:  ?   Rate and Rhythm: Normal rate.  ?Pulmonary:  ?   Effort: Pulmonary effort is normal.  ?Musculoskeletal:     ?   General: Swelling, tenderness and signs of injury present. No deformity.  ?   Cervical back: Normal range of motion and neck supple.  ?Skin: ?   General: Skin is warm and dry.  ?Neurological:  ?   Mental Status: She is alert and oriented to person, place, and time.  ? ? ?ED Results / Procedures / Treatments   ?Labs ?(all labs ordered are listed, but only abnormal results are displayed) ?Labs Reviewed - No  data to display ? ?EKG ?None ? ?Radiology ?DG Wrist Complete Left ? ?Result Date: 12/15/2021 ?CLINICAL DATA:  Fall last night with left wrist pain. EXAM: LEFT WRIST - COMPLETE 3+ VIEW COMPARISON:  None Available. FINDINGS: Diffuse decreased bone mineralization. Mild degenerate change of the distal radioulnar joint as well as the radiocarpal joint. Degenerative changes over the radial side of the carpal bones as well as first carpometacarpal joint. Minimally displaced transverse fracture of the distal radial metaphysis. Remainder the exam is unremarkable. IMPRESSION: Minimally displaced transverse fracture of the distal radial metaphysis. Electronically Signed   By: Elberta Fortis M.D.   On: 12/15/2021 09:52   ? ?Procedures ?Procedures  ? ? ?Medications Ordered in ED ?Medications   ?acetaminophen (TYLENOL) tablet 650 mg (650 mg Oral Given 12/15/21 0958)  ? ? ?ED Course/ Medical Decision Making/ A&P ?  ?                        ?Medical Decision Making ?Amount and/or Complexity of Data Reviewed ?Radiology: ordered. ? ?Risk ?OTC drugs. ? ? ?This patient presents to the ED with chief complaint(s) of fall with wrist injury with pertinent past medical history of hypertension, A-fib not on any blood thinners. ? ?The differential diagnosis includes wrist fracture, fracture dislocation. ?Based on history and exam, I feel comfortable clearing C-spine and brain clinically.  I do not think she needs a CT head or C-spine. ? ?The initial plan is to get x-ray of the wrist. ? ? ?Additional history obtained: ?Additional history obtained from family ? ? ?Independent visualization of imaging: ?- Imaging that was independently visualized with scope of interpretation limited to determining acute life threatening conditions related to emergency care: X-ray of the wrist, which revealed small nondisplaced fracture of the distal radius on the lateral view.  Radiology read pending.  I already ordered sugar-tong splint. ? ?Treatment and Reassessment: ?X-ray results confirm the fracture that we were suspecting.  Results discussed with the patient. ? ? ?Final Clinical Impression(s) / ED Diagnoses ?Final diagnoses:  ?Closed fracture of left wrist, initial encounter  ? ? ?Rx / DC Orders ?ED Discharge Orders   ? ?      Ordered  ?  acetaminophen (TYLENOL) 500 MG tablet  Every 4 hours PRN       ? 12/15/21 1022  ? ?  ?  ? ?  ? ? ?  ?Derwood Kaplan, MD ?12/15/21 1026 ? ?

## 2021-12-15 NOTE — Discharge Instructions (Signed)
The radiologist confirms that you have a small wrist fracture.  Please take Tylenol 500 mg every 4 hours for pain control.  Please follow-up with orthopedic surgery by calling the number provided and setting up an appointment in 10 to 14 days. ?

## 2021-12-15 NOTE — ED Triage Notes (Signed)
Pt to the ED from home with complaints of left wrist pain after a fall last night. ? ?Pt denies hitting her head. ? ?

## 2021-12-18 ENCOUNTER — Other Ambulatory Visit: Payer: Self-pay

## 2021-12-18 ENCOUNTER — Emergency Department (HOSPITAL_COMMUNITY)
Admission: EM | Admit: 2021-12-18 | Discharge: 2021-12-18 | Disposition: A | Discharge status: Home / Self Care | Payer: Medicare HMO | Attending: Emergency Medicine | Admitting: Emergency Medicine

## 2021-12-18 ENCOUNTER — Encounter (HOSPITAL_COMMUNITY): Payer: Self-pay

## 2021-12-18 DIAGNOSIS — Z7901 Long term (current) use of anticoagulants: Secondary | ICD-10-CM | POA: Insufficient documentation

## 2021-12-18 DIAGNOSIS — S6292XA Unspecified fracture of left wrist and hand, initial encounter for closed fracture: Secondary | ICD-10-CM | POA: Diagnosis not present

## 2021-12-18 DIAGNOSIS — R2232 Localized swelling, mass and lump, left upper limb: Secondary | ICD-10-CM | POA: Insufficient documentation

## 2021-12-18 DIAGNOSIS — M7989 Other specified soft tissue disorders: Secondary | ICD-10-CM

## 2021-12-18 NOTE — ED Triage Notes (Signed)
Pt here at the ed for increased welling to L hand/  L arm has a splint on it from recent fx.  Finger swollen, warm to touch.  Resp even and unlabored.  Skin warm and dry.  nad ?

## 2021-12-18 NOTE — ED Provider Notes (Signed)
?[place] [facility] ?Provider Note ? ? ?CSN: [id number] ?Arrival date & time: 12/18/21  1910 ? ?  ? ?History ? ?Chief Complaint  ?Patient presents with  ? Arm Swelling  ? ? ?Lauren Baker is a 81 y.o. female. ? ?Patient presents with pain and swelling to the left hand.  She had an injury 4 days ago and was seen in the ER diagnosed with a wrist fracture and placed in a sugar-tong splint.  She is noticed increased swelling and a blister forming at the edge of her fifth finger and presented to the ER.  Otherwise denies fevers or cough or vomiting or diarrhea.  Denies any arm pain at this time at rest. ? ? ?  ? ?Home Medications ?Prior to Admission medications   ?Medication Sig Start Date End Date Taking? Authorizing Provider  ?acetaminophen (TYLENOL) 500 MG tablet Take 1 tablet (500 mg total) by mouth every 4 (four) hours as needed for up to 5 days. 12/15/21 12/20/21  Varney Biles, MD  ?apixaban (ELIQUIS) 5 MG TABS tablet TAKE 1 TABLET BY MOUTH TWICE DAILY. 07/03/21   Arnoldo Lenis, MD  ?buPROPion (WELLBUTRIN) 100 MG tablet Take 100 mg by mouth 3 (three) times daily. 02/26/18   [provider]  ?cholecalciferol (VITAMIN D) 1000 units tablet Take 1,000 Units by mouth daily.    [provider]  ?clonazePAM (KLONOPIN) 0.5 MG tablet Take 0.5 mg by mouth 2 (two) times daily. 01/19/18   [provider]  ?diltiazem (CARDIZEM CD) 300 MG 24 hr capsule TAKE 1 CAPSULE BY MOUTH DAILY. 01/20/19   Arnoldo Lenis, MD  ?DULoxetine (CYMBALTA) 30 MG capsule Take 30 mg by mouth daily.  12/13/17   [provider]  ?losartan (COZAAR) 50 MG tablet Take 1 tablet (50 mg total) by mouth daily. 06/14/21   Arnoldo Lenis, MD  ?metoprolol (TOPROL-XL) 200 MG 24 hr tablet TAKE 1 TABLET BY MOUTH DAILY 03/15/21   Arnoldo Lenis, MD  ?QUEtiapine (SEROQUEL) 50 MG tablet Take 50 mg by mouth at bedtime.  02/26/18   [provider]  ?   ? ?Allergies    ?Patient has no known allergies.    ? ?Review of Systems   ?Review of Systems  ?Constitutional:  Negative for fever.  ?HENT:  Negative for ear pain.   ?Eyes:  Negative for pain.  ?Respiratory:  Negative for cough.   ?Cardiovascular:  Negative for chest pain.  ?Gastrointestinal:  Negative for abdominal pain.  ?Genitourinary:  Negative for flank pain.  ?Musculoskeletal:  Negative for back pain.  ?Skin:  Negative for rash.  ?Neurological:  Negative for headaches.  ? ?Physical Exam ?Updated Vital Signs ?BP (!) 133/97   Pulse 99   Temp 97.9 ?F (36.6 ?C) (Oral)   Resp 18   Ht 5\' 10"  (1.778 m)   Wt 85.7 kg   SpO2 97%   BMI 27.11 kg/m?  ?Physical Exam ?Constitutional:   ?   General: She is not in acute distress. ?   Appearance: Normal appearance.  ?HENT:  ?   Head: Normocephalic.  ?   Nose: Nose normal.  ?Eyes:  ?   Extraocular Movements: Extraocular movements intact.  ?Cardiovascular:  ?   Rate and Rhythm: Normal rate.  ?Pulmonary:  ?   Effort: Pulmonary effort is normal.  ?Musculoskeletal:     ?   General: Normal range of motion.  ?   Cervical back: Normal range of motion.  ?Skin: ?  Comments: 0.5 x 1 cm blister seen in the left pinky lateral base.  Swelling noted to the left hand second third fourth and fifth digits.  Governments otherwise soft wiggling her fingers appropriately neurovascularly intact.  Intact radial ulnar median nerve bilaterally.  Distal radial pulses are 2+ bilateral upper extremities.  ?Neurological:  ?   General: No focal deficit present.  ?   Mental Status: She is alert. Mental status is at baseline.  ? ? ?ED Results / Procedures / Treatments   ?Labs ?(all labs ordered are listed, but only abnormal results are displayed) ?Labs Reviewed - No data to display ? ?EKG ?None ? ?Radiology ?No results found. ? ?Procedures ?Procedures  ? ? ?Medications Ordered in ED ?Medications - No data to display ? ?ED Course/ Medical Decision Making/ A&P ?  ?                        ?Medical Decision Making ? ?History obtained from family  bedside. ? ?Chart review shows visit for fracture of the left wrist Dec 15, 2021. ? ?Splint was removed by myself.  No additional lesion or blister noted other than at the base of the left hand fifth digit.  Splint replaced with gentle wrapping with Ace wrap.  Patient tolerated procedure well states that symptoms are much improved.  No evidence of compartment syndrome as compartments are soft. ? ?Discharged home stable condition advised her to keep her appointment with orthopedist.  Band-Aid placed on the ulcer on the left hand fifth finger. ? ? ? ? ? ? ? ?Final Clinical Impression(s) / ED Diagnoses ?Final diagnoses:  ?Swelling of left hand  ? ? ?Rx / DC Orders ?ED Discharge Orders   ? ? None  ? ?  ? ? ?  ?Luna Fuse, MD ?12/18/21 2100 ? ?

## 2021-12-18 NOTE — ED Notes (Signed)
Bandaid placed on 5th finger for padding ?

## 2021-12-18 NOTE — Discharge Instructions (Signed)
Keep your appointment with orthopedic surgery. ? ?Elevate the left hand above the heart whenever possible.  Take Tylenol Motrin for pain. ? ?Return to the ER if you have worsening pain fevers or any additional concerns. ?

## 2021-12-23 ENCOUNTER — Ambulatory Visit (INDEPENDENT_AMBULATORY_CARE_PROVIDER_SITE_OTHER): Payer: Medicare HMO

## 2021-12-23 DIAGNOSIS — I441 Atrioventricular block, second degree: Secondary | ICD-10-CM | POA: Diagnosis not present

## 2021-12-24 ENCOUNTER — Ambulatory Visit: Payer: Medicare HMO | Admitting: Orthopedic Surgery

## 2021-12-24 ENCOUNTER — Encounter: Payer: Self-pay | Admitting: Orthopedic Surgery

## 2021-12-24 ENCOUNTER — Ambulatory Visit (INDEPENDENT_AMBULATORY_CARE_PROVIDER_SITE_OTHER): Payer: Medicare HMO

## 2021-12-24 VITALS — BP 135/91 | HR 103 | Ht 69.0 in | Wt 188.0 lb

## 2021-12-24 DIAGNOSIS — S62102A Fracture of unspecified carpal bone, left wrist, initial encounter for closed fracture: Secondary | ICD-10-CM

## 2021-12-24 LAB — CUP PACEART REMOTE DEVICE CHECK
Battery Remaining Longevity: 154 mo
Battery Voltage: 3.02 V
Brady Statistic RV Percent Paced: 10.57 %
Date Time Interrogation Session: 20230515004042
Implantable Lead Implant Date: 20200316
Implantable Lead Location: 753860
Implantable Lead Model: 5076
Implantable Pulse Generator Implant Date: 20200316
Lead Channel Impedance Value: 399 Ohm
Lead Channel Impedance Value: 456 Ohm
Lead Channel Pacing Threshold Amplitude: 0.625 V
Lead Channel Pacing Threshold Pulse Width: 0.4 ms
Lead Channel Sensing Intrinsic Amplitude: 10.375 mV
Lead Channel Sensing Intrinsic Amplitude: 10.375 mV
Lead Channel Setting Pacing Amplitude: 2.5 V
Lead Channel Setting Pacing Pulse Width: 0.4 ms
Lead Channel Setting Sensing Sensitivity: 1.2 mV

## 2021-12-24 NOTE — Patient Instructions (Signed)
Elevate hand ? ?Tylenol as needed for pain ? ?Sling as needed ? ?Call with issues.  ?

## 2021-12-25 ENCOUNTER — Encounter: Payer: Self-pay | Admitting: Orthopedic Surgery

## 2021-12-25 NOTE — Progress Notes (Signed)
New Patient Visit ? ?Assessment: ?Lauren Baker is a 81 y.o. female with the following: ?1. Wrist fracture, closed, left, initial encounter ? ?Plan: ?Lauren Baker has a minimally displaced fracture of the left distal radius.  Her splint is in good condition.  Injury was sustained a week ago.  I would like to keep her in the current splint for another week.  She will return for transition to a cast in approximately 1 week.  Medications as needed.  Elevate the hand is much as possible to help with swelling.  Call with questions or concerns. ? ?Follow-up: ?Return in about 1 week (around 12/31/2021). ? ?Subjective: ? ?Chief Complaint  ?Patient presents with  ? New Patient (Initial Visit)  ? Wrist Injury  ?  LT wrist ?DOI 12/14/21 ?Slipped and fell  ? ? ?History of Present Illness: ?Lauren Baker is a 81 y.o. RHD female who presents for evaluation of left wrist pain.  She states that she slipped and fell at home, landed on her left wrist.  She had immediate pain.  She presented to the emergency department, where she was diagnosed with a distal radius fracture.  She was placed in a splint.  Unfortunately, the splint was too tight, she did return to the hospital a few days later to have the splint adjusted.  Since then, she has done better.  Pain is controlled with Tylenol.  No numbness or tingling. ? ? ?Review of Systems: ?No fevers or chills ?No numbness or tingling ?No chest pain ?No shortness of breath ?No bowel or bladder dysfunction ?No GI distress ?No headaches ? ? ?Medical History: ? ?Past Medical History:  ?Diagnosis Date  ? Chronic atrial fibrillation (HCC)   ? Hypertension   ? ? ?Past Surgical History:  ?Procedure Laterality Date  ? detatched retina    ? 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 Procedure: PACEMAKER IMPLANT;  Surgeon: Hillis Range, MD;  Location: [facility];  Service: Cardiovascular;  Laterality: N/A;  ? ? ?Family History  ?Problem Relation Age of Onset  ? CVA Mother   ? Hypertension  Sister   ? Heart attack Brother   ? Heart attack Sister   ? Cancer Sister   ? ?Social History  ? ?Tobacco Use  ? Smoking status: Never  ? Smokeless tobacco: Never  ?Vaping Use  ? Vaping Use: Never used  ?Substance Use Topics  ? Alcohol use: No  ? Drug use: No  ? ? ?No Known Allergies ? ?Current Meds  ?Medication Sig  ? apixaban (ELIQUIS) 5 MG TABS tablet TAKE 1 TABLET BY MOUTH TWICE DAILY.  ? buPROPion (WELLBUTRIN) 100 MG tablet Take 100 mg by mouth 3 (three) times daily.  ? cholecalciferol (VITAMIN D) 1000 units tablet Take 1,000 Units by mouth daily.  ? clonazePAM (KLONOPIN) 0.5 MG tablet Take 0.5 mg by mouth 2 (two) times daily.  ? diltiazem (CARDIZEM CD) 300 MG 24 hr capsule TAKE 1 CAPSULE BY MOUTH DAILY.  ? DULoxetine (CYMBALTA) 30 MG capsule Take 30 mg by mouth daily.   ? levothyroxine (SYNTHROID) 25 MCG tablet Take 25 mcg by mouth daily.  ? losartan (COZAAR) 50 MG tablet Take 1 tablet (50 mg total) by mouth daily.  ? metoprolol (TOPROL-XL) 200 MG 24 hr tablet TAKE 1 TABLET BY MOUTH DAILY  ? QUEtiapine (SEROQUEL) 50 MG tablet Take 50 mg by mouth at bedtime.   ? ? ?Objective: ?BP (!) 135/91   Pulse (!) 103   Ht 5'  9" (1.753 m)   Wt 188 lb (85.3 kg)   BMI 27.76 kg/m?  ? ?Physical Exam: ? ?General: Elderly female., Alert and oriented., and No acute distress. ?Gait: Normal gait. ? ?Left arm in a sugar-tong splint.  It is clean, dry and intact.  Fingers are warm and well-perfused.  She is able to flex the exposed fingers.  No skin breakdown around the periphery of the splint.  Sensation is intact to the exposed fingers. ? ?IMAGING: ?I personally reviewed images previously obtained from the ED ? ?X-rays of the left wrist were obtained in clinic today.  These were taken in a splint.  There is a distal radius fracture, without intra-articular involvement.  On the lateral, the joint line is in neutral position.  No additional injuries are noted. ? ?Impression: Left distal radius fracture in acceptable  alignment. ? ? ?New Medications:  ?No orders of the defined types were placed in this encounter. ? ? ? ? ?Oliver Barre, MD ? ?12/25/2021 ?9:13 AM ? ? ?

## 2022-01-01 ENCOUNTER — Ambulatory Visit (INDEPENDENT_AMBULATORY_CARE_PROVIDER_SITE_OTHER): Payer: Medicare HMO | Admitting: Orthopedic Surgery

## 2022-01-01 ENCOUNTER — Ambulatory Visit (INDEPENDENT_AMBULATORY_CARE_PROVIDER_SITE_OTHER): Payer: Medicare HMO

## 2022-01-01 ENCOUNTER — Encounter: Payer: Self-pay | Admitting: Orthopedic Surgery

## 2022-01-01 DIAGNOSIS — S52502D Unspecified fracture of the lower end of left radius, subsequent encounter for closed fracture with routine healing: Secondary | ICD-10-CM | POA: Diagnosis not present

## 2022-01-01 DIAGNOSIS — S52602D Unspecified fracture of lower end of left ulna, subsequent encounter for closed fracture with routine healing: Secondary | ICD-10-CM | POA: Diagnosis not present

## 2022-01-01 NOTE — Progress Notes (Signed)
Return Patient Visit  Assessment: Lauren Baker is a 81 y.o. female with the following: 1. Wrist fracture, closed, left, initial encounter  Plan: Lauren Baker has a minimally displaced fracture of the left distal radius.  Grafts are stable.  Continue with nonoperative management.  She was placed in a cast today.  Elevate the hand is much as possible to help with swelling.  Okay to work on range of motion of her fingers.  Follow-up in 2 weeks for repeat evaluation.  Cast application -left short arm cast   Verbal consent was obtained and the correct extremity was identified. A well padded, appropriately molded short arm cast was applied to the left arm Fingers remained warm and well perfused.   There were no sharp edges Patient tolerated the procedure well Cast care instructions were provided   Follow-up: Return in about 2 weeks (around 01/15/2022).  Subjective:  Chief Complaint  Patient presents with   Post-op Follow-up    Left wrist DOI 12/14/21    History of Present Illness: Lauren Baker is a 81 y.o. RHD female who returns for evaluation of left wrist pain.  She sustained a left distal radius fracture approximately 2 weeks ago.  She has been in a splint.  Her pain is controlled with Tylenol.  No numbness or tingling.  She is elevating her hand is much as possible.  No issues since the last visit  Review of Systems: No fevers or chills No numbness or tingling No chest pain No shortness of breath No bowel or bladder dysfunction No GI distress No headaches   Objective: There were no vitals taken for this visit.  Physical Exam:  General: Elderly female., Alert and oriented., and No acute distress. Gait: Normal gait.  Left arm was evaluated out of the splint.  There is some residual bruising and swelling about the distal radius.  Fingers are swollen.  Near full range of motion of all fingers.  No skin breakdown.  2+ radial pulse.  IMAGING: I personally  reviewed images previously obtained from the ED  X-rays of the left wrist were obtained in clinic today.  These were compared to prior x-rays.  No interval displacement of the distal radius fracture.  The joint line remains in a neutral position.  Radial height is maintained.  No additional injuries are noted.  Impression: Minimally displaced left distal radius fracture, in stable alignment.   New Medications:  No orders of the defined types were placed in this encounter.     Oliver Barre, MD  01/01/2022 3:23 PM

## 2022-01-01 NOTE — Patient Instructions (Signed)

## 2022-01-10 NOTE — Progress Notes (Signed)
Remote pacemaker transmission.   

## 2022-01-13 DIAGNOSIS — E538 Deficiency of other specified B group vitamins: Secondary | ICD-10-CM | POA: Diagnosis not present

## 2022-01-15 ENCOUNTER — Encounter: Payer: Self-pay | Admitting: Orthopedic Surgery

## 2022-01-15 ENCOUNTER — Ambulatory Visit (INDEPENDENT_AMBULATORY_CARE_PROVIDER_SITE_OTHER): Payer: Medicare HMO

## 2022-01-15 ENCOUNTER — Ambulatory Visit (INDEPENDENT_AMBULATORY_CARE_PROVIDER_SITE_OTHER): Payer: Medicare HMO | Admitting: Orthopedic Surgery

## 2022-01-15 VITALS — Ht 69.0 in | Wt 188.0 lb

## 2022-01-15 DIAGNOSIS — S52502D Unspecified fracture of the lower end of left radius, subsequent encounter for closed fracture with routine healing: Secondary | ICD-10-CM

## 2022-01-15 DIAGNOSIS — S62502D Fracture of unspecified phalanx of left thumb, subsequent encounter for fracture with routine healing: Secondary | ICD-10-CM | POA: Diagnosis not present

## 2022-01-15 DIAGNOSIS — S52602D Unspecified fracture of lower end of left ulna, subsequent encounter for closed fracture with routine healing: Secondary | ICD-10-CM

## 2022-01-15 NOTE — Patient Instructions (Signed)
Wear the brace on your left wrist at all times for the next 2 weeks.  Okay to remove the brace to wash your hands or shower.  In 2 weeks time, you can start to remove the brace to work on gentle range of motion of the left wrist.  Follow-up in 4 weeks.  If the brace causes irritation or he would like to reconsider a cast, please contact clinic.

## 2022-01-15 NOTE — Progress Notes (Signed)
Return Patient Visit  Assessment: Lauren Baker is a 81 y.o. female with the following: 1. Wrist fracture, closed, left  Plan: Lauren Baker has a minimally displaced fracture of the left distal radius.  Radiographs are stable.  She has some pain, but is tolerating gentle range of motion.  At this point, we will transition her to a removable wrist brace.  I have asked her to keep this on at all times for the next 2 weeks.  Okay to remove for hygiene.  In 2 weeks time, she can start to remove the brace to work on gentle range of motion only.  Follow-up in 4 weeks.   Follow-up: Return in about 4 weeks (around 02/12/2022).  Subjective:  Chief Complaint  Patient presents with   Fracture    Lt wrist DOI 12/14/21    History of Present Illness: Lauren Baker is a 81 y.o. RHD female who returns for evaluation of left wrist pain.  She sustained a left distal radius fracture approximately 4 weeks ago.  She has been in a cast for the past 2 weeks.  She has tolerated this well.  She does not have any pain in the cast.  No numbness or tingling.   Review of Systems: No fevers or chills No numbness or tingling No chest pain No shortness of breath No bowel or bladder dysfunction No GI distress No headaches   Objective: Ht 5\' 9"  (1.753 m)   Wt 188 lb (85.3 kg)   BMI 27.76 kg/m   Physical Exam:  General: Elderly female., Alert and oriented., and No acute distress. Gait: Normal gait.  After removal of the cast, no skin breakdown.  No bruising or swelling is appreciated.  Previous blisters are healing appropriately.  Mild tenderness to palpation on the volar and radial aspect of the wrist.  She is tolerating gentle, active range of motion of the wrist.  Fingers are warm and well-perfused.  IMAGING: I personally reviewed images previously obtained from the ED  X-rays of the left wrist were obtained in clinic today.  No acute injuries are noted.  Joint line is neutral, as viewed  on the lateral.  Well-maintained radial height.  No interval displacement.  No subsidence of the fracture site.  Impression: Stable left distal radius fracture, unchanged alignment.   New Medications:  No orders of the defined types were placed in this encounter.     , MD  01/15/2022 4:10 PM

## 2022-02-13 DIAGNOSIS — E538 Deficiency of other specified B group vitamins: Secondary | ICD-10-CM | POA: Diagnosis not present

## 2022-02-18 ENCOUNTER — Encounter: Payer: Self-pay | Admitting: Orthopedic Surgery

## 2022-02-18 ENCOUNTER — Ambulatory Visit (INDEPENDENT_AMBULATORY_CARE_PROVIDER_SITE_OTHER): Payer: Medicare HMO | Admitting: Orthopedic Surgery

## 2022-02-18 VITALS — Ht 69.0 in | Wt 188.0 lb

## 2022-02-18 DIAGNOSIS — S52602D Unspecified fracture of lower end of left ulna, subsequent encounter for closed fracture with routine healing: Secondary | ICD-10-CM

## 2022-02-18 DIAGNOSIS — S52502D Unspecified fracture of the lower end of left radius, subsequent encounter for closed fracture with routine healing: Secondary | ICD-10-CM

## 2022-02-18 NOTE — Progress Notes (Signed)
Return Patient Visit  Assessment: Lauren Baker is a 81 y.o. female with the following: 1. Wrist fracture, closed, left  Plan: SHELA ESSES has a minimally displaced fracture of the left distal radius.  Limited pain in the right wrist.  Her function is getting better.  Urged her to continue working on range of motion and strengthening.  Gradual return to her previous level of activities.  She should work to get rid of the brace.  Follow-up as needed.   Follow-up: Return if symptoms worsen or fail to improve.  Subjective:  Chief Complaint  Patient presents with   Fracture    Lt wrist DOI 12/14/21    History of Present Illness: Lauren Baker is a 81 y.o. RHD female who returns for evaluation of left wrist pain.  She sustained a left distal radius fracture approximately 2 months ago.  She was in a cast for approximately 1 month, followed by a brace for 2 to 3 weeks.  She continues to use the brace a little bit.  The pain is improved.  Range of motion is gotten better.  She still has limited function of her left hand overall.  Review of Systems: No fevers or chills No numbness or tingling No chest pain No shortness of breath No bowel or bladder dysfunction No GI distress No headaches   Objective: Ht 5\' 9"  (1.753 m)   Wt 188 lb (85.3 kg)   BMI 27.76 kg/m   Physical Exam:  General: Elderly female., Alert and oriented., and No acute distress. Gait: Normal gait.  Left wrist without swelling.  No bruising.  She tolerates active flexion and extension of the wrist, with limited discomfort.  Grip strength is 4/5.  Fingers are warm and well-perfused.  Sensation intact throughout the left hand.  2+ radial pulse.   IMAGING: I personally reviewed images previously obtained from the ED  No new imaging obtained today.   New Medications:  No orders of the defined types were placed in this encounter.     , MD  02/18/2022 3:48 PM

## 2022-02-18 NOTE — Patient Instructions (Signed)
Wrist Fracture Rehab Ask your health care provider which exercises are safe for you. Do exercises exactly as told by your health care provider and adjust them as directed. It is normal to feel mild stretching, pulling, tightness, or discomfort as you do these exercises. Stop right away if you feel sudden pain or your pain gets worse. Do not begin these exercises until told by your health care provider. Stretching and range-of-motion exercises These exercises warm up your muscles and joints and improve the movement and flexibility of your wrist and hand. These exercises also help to relieve pain,numbness, and tingling. Finger flexion and extension Sit or stand with your elbow at your side. Open and stretch your left / right fingers as wide as you can (extension). Hold this position for 10 seconds. Close your left / right fingers into a gentle fist (flexion). Hold this position for 10 seconds. Slowly return to the starting position. Repeat 10 times. Complete this exercise 1-2 times a day. Wrist flexion Bend your left / right elbow to a 90-degree angle (right angle) with your palm facing the floor. Bend your wrist forward so your fingers point toward the floor (flexion). Hold this position for 10 seconds. Slowly return to the starting position. Repeat 10 times. Complete this exercise 1-2 times a day. Wrist extension Bend your left / right elbow to a 90-degree angle (right angle) with your palm facing the floor. Bend your wrist backward so your fingers point toward the ceiling (extension). Hold this position for 10 seconds. Slowly return to the starting position. Repeat 10 times. Complete this exercise 1-2 times a day. Ulnar deviation Bend your left / right elbow to a 90-degree angle (right angle), and rest your forearm on a table with your palm facing down. Keeping your hand flat on the table, bend your left / right wrist toward your small finger (pinkie). This is ulnar deviation. Hold this  position for 10 seconds. Slowly return to the starting position. Repeat 10 times. Complete this exercise 1-2 times a day. Radial deviation Bend your left / right elbow to a 90-degree angle (right angle), and rest your forearm on a table with your palm facing down. Keeping your hand flat on the table, bend your left / right wrist toward your thumb. This is radial deviation. Hold this position for 10 seconds. Slowly return to the starting position. Repeat 10 times. Complete this exercise 1-2 times a day. Forearm rotation, supination Stand or sit with your left / right elbow bent to a 90-degree angle (right angle) at your side. Position your forearm so that the thumb is facing the ceiling (neutral position). Turn (rotate) your palm up toward the ceiling (supination), stopping when you feel a gentle stretch. Hold this position for 10 seconds. Slowly return to the starting position. Repeat 10 times. Complete this exercise 1-2 times a day. Forearm rotation, pronation Stand or sit with your left / right elbow bent to a 90-degree angle (right angle) at your side. Position your forearm so that the thumb is facing the ceiling (neutral position). Turn (rotate) your palm down toward the floor (pronation), stopping when you feel a gentle stretch. Hold this position for 10 seconds. Slowly return to the starting position. Repeat 10 times. Complete this exercise 1-2 times a day. Wrist flexion stretch  Extend your left / right arm in front of you and turn your palm down toward the floor. If told by your health care provider, bend your left / right arm to a 90-degree angle (  right angle) at your side. Using your uninjured hand, gently press over the back of your left / right hand to bend your wrist and fingers toward the floor (flexion). Go as far as you can to feel a stretch without causing pain. Hold this position for 10 seconds. Slowly return to the starting position. Repeat 10 times. Complete this  exercise 1-2 times a day. Wrist extension stretch  Extend your left / right arm in front of you and turn your palm up toward the ceiling. If told by your health care provider, bend your left / right arm to a 90-degree angle (right angle) at your side. Using your uninjured hand, gently press over the palm of your left / right hand to bend your wrist and fingers toward the floor (extension). Go as far as you can to feel a stretch without causing pain. Hold this position for 10 seconds. Slowly return to the starting position. Repeat 10 times. Complete this exercise 1-2 times a day. Forearm rotation stretch, supination Stand or sit with your arms at your sides. Bend your left / right elbow to a 90-degree angle (right angle). Using your uninjured hand, turn your left / right palm up toward the ceiling (assisted supination) until you feel a gentle stretch in the inside of your forearm. Hold this position for 10 seconds. Slowly return to the starting position. Repeat 10 times. Complete this exercise 1-2 times a day. Forearm rotation stretch, pronation Stand or sit with your arms at your sides. Bend your left / right elbow to a 90-degree angle (right angle). Using your uninjured hand, turn your left / right palm down toward the floor (assisted pronation) until you feel a gentle stretch in the top of your forearm. Hold this position for 10 seconds. Slowly return to the starting position. Repeat 10 times. Complete this exercise 1-2 times a day. Strengthening exercises These exercises build strength and endurance in your wrist and hand. Enduranceis the ability to use your muscles for a long time, even after they get tired. Wrist flexion Sit with your left / right forearm supported on a table. Your elbow should be at waist height. Rest your hand over the edge of the table, palm up. Gently grasp a 5 lb / kg weight (can of soup). Or, hold an exercise band or tube in both hands, keeping your hands at  the same level and hip distance apart. There should be slight tension in the exercise band or tube. Without moving your forearm or elbow, slowly bend your wrist up toward the ceiling (wrist flexion). Hold this position for 10 seconds. Slowly return to the starting position. Repeat 10 times. Complete this exercise 1-2 times a day. Wrist extension Sit with your left / right forearm supported on a table. Your elbow should be at waist height. Rest your hand over the edge of the table, palm down. Gently grasp a 5 lb / kg weight. Or, hold an exercise band or tube in both hands, keeping your hands at the same level and hip distance apart. There should be slight tension in the exercise band or tube. Without moving your forearm or elbow, slowly curl your hand up toward the ceiling (extension). Hold this position for 10 seconds. Slowly return to the starting position. Repeat 10 times. Complete this exercise 1-2 times a day. Forearm rotation, supination  Sit with your left / right forearm supported on a table. Your elbow should be at waist height. Rest your hand over the edge of the   table, palm down. Gently grasp a lightweight hammer near the head. As this exercise gets easier for you, try holding the hammer farther down the handle. Without moving your elbow, slowly turn (rotate) your palm up toward the ceiling (supination). Hold this position for 10 seconds. Slowly return to the starting position. Repeat 10 times. Complete this exercise 1-2 times a day. Forearm rotation, pronation  Sit with your left / right forearm supported on a table. Your elbow should be at waist height. Rest your hand over the edge of the table, palm up. Gently grasp a lightweight hammer near the head. As this exercise gets easier for you, try holding the hammer farther down the handle. Without moving your elbow, slowly turn (rotate) your palm down toward the floor (pronation). Hold this position for 10 seconds. Slowly return  to the starting position. Repeat 10 times. Complete this exercise 1-2 times a day. Grip strengthening  Hold one of these items in your left / right hand: a dense sponge, a stress ball, or a large, rolled sock. Slowly squeeze the object as hard as you can without increasing any pain. Hold your squeeze for 10 seconds. Slowly release your grip. Repeat 10 times. Complete this exercise 1-2 times a day. This information is not intended to replace advice given to you by your health care provider. Make sure you discuss any questions you have with your healthcare provider. Document Revised: 12/08/2019 Document Reviewed: 12/08/2019 Elsevier Patient Education  2022 Elsevier Inc.  

## 2022-03-03 NOTE — Progress Notes (Unsigned)
Cardiology Office Note Date:  03/04/2022  Patient ID:  Lauren, Baker 10/17/1940, MRN [id number] PCP:  Elfredia Nevins, MD  Cardiologist:  Dr. Wyline Mood Electrophysiologist: Dr. Johney Frame    Chief Complaint: annual visit  History of Present Illness: Lauren Baker is a 81 y.o. female with history of HTN, permanent AFib, tachy-brady w/PPM  She comes in today to be seen for Dr. Johney Frame, last seen by him June 2022, c/o SOB with moderate activity, rate controlled permanent AFib, device function intact, no changes were made.  She saw Dr. Wyline Mood Nov 2022 via tele health, no changes were made.  TODAY She is accompanied by her daughter She feels very well. Lives independently, denies difficulties with her ADLs No CP, palpitations or cardiac awareness No SOB No near syncope or syncope. 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 6 tablet 5   buPROPion (WELLBUTRIN) 100 MG tablet Take 100 mg by mouth 3 (three) times daily.  6   cholecalciferol (VITAMIN D) 1000 units tablet Take 1,000 Units by mouth daily.     clonazePAM (KLONOPIN) 0.5 MG tablet Take 0.5 mg by mouth 2 (two) times daily.  0   diltiazem (CARDIZEM CD) 300 MG 24 hr capsule TAKE 1 CAPSULE BY MOUTH DAILY. 90 capsule 2   DULoxetine (CYMBALTA) 30 MG capsule Take 30 mg by mouth daily.   3   levothyroxine (SYNTHROID) 25 MCG tablet Take 25 mcg by mouth daily.      losartan (COZAAR) 50 MG tablet Take 1 tablet (50 mg total) by mouth daily. 90 tablet 3   metoprolol (TOPROL-XL) 200 MG 24 hr tablet TAKE 1 TABLET BY MOUTH DAILY 60 tablet 6   QUEtiapine (SEROQUEL) 50 MG tablet Take 50 mg by mouth at bedtime.   5   No current facility-administered medications for this visit.    Allergies:   Patient has no known allergies.   Social History:  The patient  reports that she has never smoked. She has never used smokeless tobacco. She reports that she does not drink alcohol and does not use drugs.   Family History:  The patient's family history includes CVA in her mother; Cancer in her sister; Heart attack in her brother and sister; Hypertension in her sister.  ROS:  Please see the history of present illness.    All other systems are reviewed and otherwise negative.   PHYSICAL EXAM:  VS:  BP 110/72   Pulse (!) 59   Ht 5\' 9"  (1.753 m)   Wt 185 lb 3.2 oz (84 kg)   SpO2 95%   BMI 27.35 kg/m  BMI: Body mass index is 27.35 kg/m. Well nourished, well developed, in no acute distress HEENT: normocephalic, atraumatic Neck: no JVD, carotid bruits or masses Cardiac:  irreg-irreg; no significant murmurs, no rubs, or gallops Lungs:   CTA b/l, no wheezing, rhonchi or rales Abd: soft,  nontender MS: no deformity or atrophy Ext: no edema Skin: warm and dry, no rash Neuro:  No gross deficits appreciated Psych: euthymic mood, full affect  PPM site is stable, no tethering or discomfort   EKG:  Done today and reviewed by myself shows  AFib with both paced and sensed beats, 59bpm  Device interrogation done today and reviewed by myself:  Battery and lead measurements are good VP 9.5%HR histograms look OK, some rates are getting > 120, no HVR episodes   10/19/2018: TTE  1. The left ventricle has hyperdynamic systolic function, with an  ejection fraction of >65%. The cavity size was normal. There is mildly  increased left ventricular wall thickness. Left  ventricular diastolic  function could not be evaluated secondary to  atrial fibrillation.   2. The right ventricle has normal systolic function. The cavity was  normal. There is no increase in right ventricular wall thickness.   3. Left atrial size was mildly dilated.   4. Mild thickening of the mitral valve leaflet.   5. The tricuspid valve is grossly normal.   6. The aortic valve is tricuspid Mild thickening of the aortic valve  Aortic valve regurgitation is trivial by color flow Doppler.   7. The inferior vena cava was normal in size with <50% respiratory  variability.   8. Vigorous LV systolic function; mild LVH; trace AI; mild MR; mild LAE;  mild TR; moderately elevated pulmonary pressure.   Recent Labs: No results found for requested labs within last 365 days.  No results found for requested labs within last 365 days.   CrCl cannot be calculated (Patient's most recent lab result is older than the maximum 21 days allowed.).   Wt Readings from Last 3 Encounters:  03/04/22 185 lb 3.2 oz (84 kg)  02/18/22 188 lb (85.3 kg)  01/15/22 188 lb (85.3 kg)     Other studies reviewed: Additional studies/records reviewed today include: summarized above  ASSESSMENT AND PLAN:  PPM Intact function No programming changes made  Permanent AFib CHA2DS2Vasc is 4, on Eliquis, appropriately dosed Rate looks fairly well controlled  Labs today  HTN Looks good  Disposition: F/u with [place] annually, sooner if needed, will plan to transition to Dr. Elberta Fortis next year  Current medicines are reviewed at length with the patient today.  The patient did not have any concerns regarding medicines.  Norma Fredrickson, PA-C 03/04/2022 4:14 PM     [facility] [address] [place] [address] [phone] (office)  [phone] (fax)

## 2022-03-04 ENCOUNTER — Ambulatory Visit (INDEPENDENT_AMBULATORY_CARE_PROVIDER_SITE_OTHER): Payer: Medicare HMO | Admitting: Physician Assistant

## 2022-03-04 ENCOUNTER — Encounter: Payer: Self-pay | Admitting: Physician Assistant

## 2022-03-04 VITALS — BP 110/72 | HR 59 | Ht 69.0 in | Wt 185.2 lb

## 2022-03-04 DIAGNOSIS — I482 Chronic atrial fibrillation, unspecified: Secondary | ICD-10-CM

## 2022-03-04 DIAGNOSIS — I10 Essential (primary) hypertension: Secondary | ICD-10-CM | POA: Diagnosis not present

## 2022-03-04 DIAGNOSIS — Z950 Presence of cardiac pacemaker: Secondary | ICD-10-CM

## 2022-03-04 LAB — CUP PACEART INCLINIC DEVICE CHECK
Battery Remaining Longevity: 152 mo
Battery Voltage: 3.02 V
Brady Statistic RV Percent Paced: 9.5 %
Date Time Interrogation Session: 20230725175014
Implantable Lead Implant Date: 20200316
Implantable Lead Location: 753860
Implantable Lead Model: 5076
Implantable Pulse Generator Implant Date: 20200316
Lead Channel Impedance Value: 399 Ohm
Lead Channel Impedance Value: 475 Ohm
Lead Channel Pacing Threshold Amplitude: 0.625 V
Lead Channel Pacing Threshold Pulse Width: 0.4 ms
Lead Channel Sensing Intrinsic Amplitude: 10.625 mV
Lead Channel Sensing Intrinsic Amplitude: 14.5 mV
Lead Channel Setting Pacing Amplitude: 2.5 V
Lead Channel Setting Pacing Pulse Width: 0.4 ms
Lead Channel Setting Sensing Sensitivity: 1.2 mV

## 2022-03-04 NOTE — Patient Instructions (Addendum)
Medication Instructions:  Your physician recommends that you continue on your current medications as directed. Please refer to the Current Medication list given to you today.  Labwork: You will have blood work drawn, BMET and CBC  Testing/Procedures: None ordered.  Follow-Up: Your physician wants you to follow-up in: ONE YEAR with Dr. Carleene Mains;  New patient visit.    Remote monitoring is used to monitor your Pacemaker from home. This monitoring reduces the number of office visits required to check your device to one time per year. It allows [place] to keep an eye on the functioning of your device to ensure it is working properly. You are scheduled for a device check from home on 03/24/2022. You may send your transmission at any time that day. If you have a wireless device, the transmission will be sent automatically. After your physician reviews your transmission, you will receive a postcard with your next transmission date.  Any Other Special Instructions Will Be Listed Below (If Applicable).  If you need a refill on your cardiac medications before your next appointment, please call your pharmacy.   Important Information About Sugar

## 2022-03-05 LAB — BASIC METABOLIC PANEL
BUN/Creatinine Ratio: 12 (ref 12–28)
BUN: 15 mg/dL (ref 8–27)
CO2: 24 mmol/L (ref 20–29)
Calcium: 9.8 mg/dL (ref 8.7–10.3)
Chloride: 101 mmol/L (ref 96–106)
Creatinine, Ser: 1.25 mg/dL — ABNORMAL HIGH (ref 0.57–1.00)
Glucose: 125 mg/dL — ABNORMAL HIGH (ref 70–99)
Potassium: 4.7 mmol/L (ref 3.5–5.2)
Sodium: 139 mmol/L (ref 134–144)
eGFR: 44 mL/min/{1.73_m2} — ABNORMAL LOW (ref >59)

## 2022-03-05 LAB — CBC WITH DIFFERENTIAL/PLATELET
Basophils Absolute: 0.1 10*3/uL (ref 0.0–0.2)
Basos: 2 %
EOS (ABSOLUTE): 0.3 10*3/uL (ref 0.0–0.4)
Eos: 4 %
Hematocrit: 42.9 % (ref 34.0–46.6)
Hemoglobin: 14.8 g/dL (ref 11.1–15.9)
Immature Grans (Abs): 0 10*3/uL (ref 0.0–0.1)
Immature Granulocytes: 0 %
Lymphocytes Absolute: 1.4 10*3/uL (ref 0.7–3.1)
Lymphs: 23 %
MCH: 31.2 pg (ref 26.6–33.0)
MCHC: 34.5 g/dL (ref 31.5–35.7)
MCV: 91 fL (ref 79–97)
Monocytes Absolute: 0.8 10*3/uL (ref 0.1–0.9)
Monocytes: 14 %
Neutrophils Absolute: 3.4 10*3/uL (ref 1.4–7.0)
Neutrophils: 57 %
Platelets: 184 10*3/uL (ref 150–450)
RBC: 4.74 x10E6/uL (ref 3.77–5.28)
RDW: 12.9 % (ref 11.7–15.4)
WBC: 6 10*3/uL (ref 3.4–10.8)

## 2022-03-13 DIAGNOSIS — E538 Deficiency of other specified B group vitamins: Secondary | ICD-10-CM | POA: Diagnosis not present

## 2022-03-24 ENCOUNTER — Ambulatory Visit: Payer: Medicare HMO

## 2022-03-25 LAB — CUP PACEART REMOTE DEVICE CHECK
Battery Remaining Longevity: 151 mo
Battery Voltage: 3.02 V
Brady Statistic RV Percent Paced: 13.45 %
Date Time Interrogation Session: 20230814004233
Implantable Lead Implant Date: 20200316
Implantable Lead Location: 753860
Implantable Lead Model: 5076
Implantable Pulse Generator Implant Date: 20200316
Lead Channel Impedance Value: 380 Ohm
Lead Channel Impedance Value: 437 Ohm
Lead Channel Pacing Threshold Amplitude: 0.625 V
Lead Channel Pacing Threshold Pulse Width: 0.4 ms
Lead Channel Sensing Intrinsic Amplitude: 11.875 mV
Lead Channel Sensing Intrinsic Amplitude: 11.875 mV
Lead Channel Setting Pacing Amplitude: 2.5 V
Lead Channel Setting Pacing Pulse Width: 0.4 ms
Lead Channel Setting Sensing Sensitivity: 1.2 mV

## 2022-04-03 ENCOUNTER — Other Ambulatory Visit: Payer: Self-pay | Admitting: *Deleted

## 2022-04-03 DIAGNOSIS — Z79899 Other long term (current) drug therapy: Secondary | ICD-10-CM

## 2022-04-10 DIAGNOSIS — I4891 Unspecified atrial fibrillation: Secondary | ICD-10-CM | POA: Diagnosis not present

## 2022-04-10 DIAGNOSIS — F330 Major depressive disorder, recurrent, mild: Secondary | ICD-10-CM | POA: Diagnosis not present

## 2022-04-10 DIAGNOSIS — I10 Essential (primary) hypertension: Secondary | ICD-10-CM | POA: Diagnosis not present

## 2022-04-16 ENCOUNTER — Other Ambulatory Visit (HOSPITAL_COMMUNITY)
Admission: RE | Admit: 2022-04-16 | Discharge: 2022-04-16 | Disposition: A | Discharge status: Home / Self Care | Payer: Medicare HMO | Source: Ambulatory Visit | Attending: Physician Assistant | Admitting: Physician Assistant

## 2022-04-16 DIAGNOSIS — E538 Deficiency of other specified B group vitamins: Secondary | ICD-10-CM | POA: Diagnosis not present

## 2022-04-16 DIAGNOSIS — Z79899 Other long term (current) drug therapy: Secondary | ICD-10-CM | POA: Insufficient documentation

## 2022-04-16 LAB — BASIC METABOLIC PANEL
Anion gap: 7 (ref 5–15)
BUN: 14 mg/dL (ref 8–23)
CO2: 25 mmol/L (ref 22–32)
Calcium: 9.3 mg/dL (ref 8.9–10.3)
Chloride: 104 mmol/L (ref 98–111)
Creatinine, Ser: 1.17 mg/dL — ABNORMAL HIGH (ref 0.44–1.00)
GFR, Estimated: 47 mL/min — ABNORMAL LOW (ref >60)
Glucose, Bld: 124 mg/dL — ABNORMAL HIGH (ref 70–99)
Potassium: 3.9 mmol/L (ref 3.5–5.1)
Sodium: 136 mmol/L (ref 135–145)

## 2022-05-15 DIAGNOSIS — E538 Deficiency of other specified B group vitamins: Secondary | ICD-10-CM | POA: Diagnosis not present

## 2022-06-12 DIAGNOSIS — E559 Vitamin D deficiency, unspecified: Secondary | ICD-10-CM | POA: Diagnosis not present

## 2022-06-23 ENCOUNTER — Ambulatory Visit (INDEPENDENT_AMBULATORY_CARE_PROVIDER_SITE_OTHER): Payer: Medicare HMO

## 2022-06-23 DIAGNOSIS — I495 Sick sinus syndrome: Secondary | ICD-10-CM | POA: Diagnosis not present

## 2022-06-24 LAB — CUP PACEART REMOTE DEVICE CHECK
Battery Remaining Longevity: 148 mo
Battery Voltage: 3.02 V
Brady Statistic RV Percent Paced: 13.33 %
Date Time Interrogation Session: 20231112234302
Implantable Lead Connection Status: 753985
Implantable Lead Implant Date: 20200316
Implantable Lead Location: 753860
Implantable Lead Model: 5076
Implantable Pulse Generator Implant Date: 20200316
Lead Channel Impedance Value: 380 Ohm
Lead Channel Impedance Value: 437 Ohm
Lead Channel Pacing Threshold Amplitude: 0.625 V
Lead Channel Pacing Threshold Pulse Width: 0.4 ms
Lead Channel Sensing Intrinsic Amplitude: 10.25 mV
Lead Channel Sensing Intrinsic Amplitude: 10.25 mV
Lead Channel Setting Pacing Amplitude: 2.5 V
Lead Channel Setting Pacing Pulse Width: 0.4 ms
Lead Channel Setting Sensing Sensitivity: 1.2 mV
Zone Setting Status: 755011

## 2022-07-16 DIAGNOSIS — E538 Deficiency of other specified B group vitamins: Secondary | ICD-10-CM | POA: Diagnosis not present

## 2022-08-01 DIAGNOSIS — Z0001 Encounter for general adult medical examination with abnormal findings: Secondary | ICD-10-CM | POA: Diagnosis not present

## 2022-08-01 DIAGNOSIS — I10 Essential (primary) hypertension: Secondary | ICD-10-CM | POA: Diagnosis not present

## 2022-08-01 DIAGNOSIS — Z13220 Encounter for screening for lipoid disorders: Secondary | ICD-10-CM | POA: Diagnosis not present

## 2022-08-01 DIAGNOSIS — E538 Deficiency of other specified B group vitamins: Secondary | ICD-10-CM | POA: Diagnosis not present

## 2022-08-01 DIAGNOSIS — Z6826 Body mass index (BMI) 26.0-26.9, adult: Secondary | ICD-10-CM | POA: Diagnosis not present

## 2022-08-01 DIAGNOSIS — R7309 Other abnormal glucose: Secondary | ICD-10-CM | POA: Diagnosis not present

## 2022-08-01 DIAGNOSIS — Z1331 Encounter for screening for depression: Secondary | ICD-10-CM | POA: Diagnosis not present

## 2022-08-01 DIAGNOSIS — E663 Overweight: Secondary | ICD-10-CM | POA: Diagnosis not present

## 2022-08-01 DIAGNOSIS — I4891 Unspecified atrial fibrillation: Secondary | ICD-10-CM | POA: Diagnosis not present

## 2022-08-01 DIAGNOSIS — E039 Hypothyroidism, unspecified: Secondary | ICD-10-CM | POA: Diagnosis not present

## 2022-08-01 DIAGNOSIS — E559 Vitamin D deficiency, unspecified: Secondary | ICD-10-CM | POA: Diagnosis not present

## 2022-08-06 NOTE — Progress Notes (Signed)
Remote pacemaker transmission.   

## 2022-08-14 DIAGNOSIS — E538 Deficiency of other specified B group vitamins: Secondary | ICD-10-CM | POA: Diagnosis not present

## 2022-09-16 DIAGNOSIS — E538 Deficiency of other specified B group vitamins: Secondary | ICD-10-CM | POA: Diagnosis not present

## 2022-09-22 ENCOUNTER — Ambulatory Visit (INDEPENDENT_AMBULATORY_CARE_PROVIDER_SITE_OTHER): Payer: Medicare HMO

## 2022-09-22 DIAGNOSIS — I495 Sick sinus syndrome: Secondary | ICD-10-CM

## 2022-09-23 LAB — CUP PACEART REMOTE DEVICE CHECK
Battery Remaining Longevity: 145 mo
Battery Voltage: 3.02 V
Brady Statistic RV Percent Paced: 12.03 %
Date Time Interrogation Session: 20240211234113
Implantable Lead Connection Status: 753985
Implantable Lead Implant Date: 20200316
Implantable Lead Location: 753860
Implantable Lead Model: 5076
Implantable Pulse Generator Implant Date: 20200316
Lead Channel Impedance Value: 361 Ohm
Lead Channel Impedance Value: 418 Ohm
Lead Channel Pacing Threshold Amplitude: 0.5 V
Lead Channel Pacing Threshold Pulse Width: 0.4 ms
Lead Channel Sensing Intrinsic Amplitude: 11.875 mV
Lead Channel Sensing Intrinsic Amplitude: 11.875 mV
Lead Channel Setting Pacing Amplitude: 2.5 V
Lead Channel Setting Pacing Pulse Width: 0.4 ms
Lead Channel Setting Sensing Sensitivity: 1.2 mV
Zone Setting Status: 755011

## 2022-09-29 ENCOUNTER — Telehealth: Payer: Self-pay | Admitting: Cardiology

## 2022-09-29 DIAGNOSIS — I10 Essential (primary) hypertension: Secondary | ICD-10-CM

## 2022-09-29 DIAGNOSIS — I482 Chronic atrial fibrillation, unspecified: Secondary | ICD-10-CM

## 2022-09-29 MED ORDER — METOPROLOL SUCCINATE ER 200 MG PO TB24: 200.0000 mg | ORAL_TABLET | Freq: Every day | ORAL | 0 refills | Status: DC

## 2022-09-29 MED ORDER — LOSARTAN POTASSIUM 50 MG PO TABS: 50.0000 mg | ORAL_TABLET | Freq: Every day | ORAL | 0 refills | Status: DC

## 2022-09-29 MED ORDER — DILTIAZEM HCL ER COATED BEADS 300 MG PO CP24: 300.0000 mg | ORAL_CAPSULE | Freq: Every day | ORAL | 0 refills | Status: DC

## 2022-09-29 NOTE — Telephone Encounter (Signed)
*  STAT* If patient is at the pharmacy, call can be transferred to refill team.   1. Which medications need to be refilled? (please list name of each medication and dose if known)   losartan (COZAAR) 50 MG tablet  metoprolol (TOPROL-XL) 200 MG 24 hr tablet  diltiazem (CARDIZEM CD) 300 MG 24 hr capsule   2. Which pharmacy/location (including street and city if local pharmacy) is medication to be sent to?  [place], [address]   3. Do they need a 30 day or 90 day supply?   90 day  Daughter stated patient is almost out of these medications.

## 2022-09-29 NOTE — Telephone Encounter (Signed)
Addition to previous note:  Patient has appointment scheduled on 4/18.

## 2022-09-29 NOTE — Telephone Encounter (Signed)
90 day refills given

## 2022-10-09 ENCOUNTER — Encounter: Payer: Self-pay | Admitting: Radiology

## 2022-10-14 DIAGNOSIS — E039 Hypothyroidism, unspecified: Secondary | ICD-10-CM | POA: Diagnosis not present

## 2022-10-24 DIAGNOSIS — F419 Anxiety disorder, unspecified: Secondary | ICD-10-CM | POA: Diagnosis not present

## 2022-10-24 DIAGNOSIS — I4891 Unspecified atrial fibrillation: Secondary | ICD-10-CM | POA: Diagnosis not present

## 2022-10-24 DIAGNOSIS — E039 Hypothyroidism, unspecified: Secondary | ICD-10-CM | POA: Diagnosis not present

## 2022-10-24 DIAGNOSIS — I10 Essential (primary) hypertension: Secondary | ICD-10-CM | POA: Diagnosis not present

## 2022-10-28 ENCOUNTER — Telehealth: Payer: Self-pay | Admitting: Cardiology

## 2022-10-28 NOTE — Telephone Encounter (Signed)
Pt c/o medication issue:  1. Name of Medication: apixaban (ELIQUIS) 5 MG TABS tablet   2. How are you currently taking this medication (dosage and times per day)? TAKE 1 TABLET BY MOUTH TWICE DAILY   3. Are you having a reaction (difficulty breathing--STAT)? noi  4. What is your medication issue? Patient accidentally took the meds at 2:30 this afternoon. Daughter want to make sure the patient would be okay. Because she normally takes a 2nd dosage at 8pm. Her first dosage was at 8:15am. Please advise

## 2022-10-28 NOTE — Telephone Encounter (Signed)
Called and spoke to daughter.  Informed it is not a major issue that her mom took her Eliquis to early this afternoon.  Told her not to take it again tonight and restart it tomorrow at her regular hours.  She verbalized understanding and appreciated call back.

## 2022-11-05 NOTE — Progress Notes (Signed)
Remote pacemaker transmission.   

## 2022-11-14 DIAGNOSIS — E538 Deficiency of other specified B group vitamins: Secondary | ICD-10-CM | POA: Diagnosis not present

## 2022-11-27 ENCOUNTER — Ambulatory Visit: Payer: Medicare HMO | Admitting: Cardiology

## 2022-12-15 DIAGNOSIS — E538 Deficiency of other specified B group vitamins: Secondary | ICD-10-CM | POA: Diagnosis not present

## 2022-12-22 ENCOUNTER — Ambulatory Visit (INDEPENDENT_AMBULATORY_CARE_PROVIDER_SITE_OTHER): Payer: Medicare HMO

## 2022-12-22 DIAGNOSIS — I482 Chronic atrial fibrillation, unspecified: Secondary | ICD-10-CM

## 2022-12-22 LAB — CUP PACEART REMOTE DEVICE CHECK
Battery Remaining Longevity: 142 mo
Battery Voltage: 3.02 V
Brady Statistic RV Percent Paced: 6.63 %
Date Time Interrogation Session: 20240513004327
Implantable Lead Connection Status: 753985
Implantable Lead Implant Date: 20200316
Implantable Lead Location: 753860
Implantable Lead Model: 5076
Implantable Pulse Generator Implant Date: 20200316
Lead Channel Impedance Value: 380 Ohm
Lead Channel Impedance Value: 437 Ohm
Lead Channel Pacing Threshold Amplitude: 0.5 V
Lead Channel Pacing Threshold Pulse Width: 0.4 ms
Lead Channel Sensing Intrinsic Amplitude: 12.125 mV
Lead Channel Sensing Intrinsic Amplitude: 12.125 mV
Lead Channel Setting Pacing Amplitude: 2.5 V
Lead Channel Setting Pacing Pulse Width: 0.4 ms
Lead Channel Setting Sensing Sensitivity: 1.2 mV
Zone Setting Status: 755011

## 2023-01-06 DIAGNOSIS — N3001 Acute cystitis with hematuria: Secondary | ICD-10-CM | POA: Diagnosis not present

## 2023-01-06 DIAGNOSIS — R319 Hematuria, unspecified: Secondary | ICD-10-CM | POA: Diagnosis not present

## 2023-01-07 NOTE — Progress Notes (Deleted)
Cardiology Office Note:    Date:  01/07/2023   ID:  Lauren Baker, DOB 1940-11-17, MRN [id number]  PCP:  Elfredia Nevins, MD  [facility] Providers Cardiologist:  Dina Rich, MD { Click to update primary MD,subspecialty MD or APP then REFRESH:1}  *** Referring MD: Elfredia Nevins, MD   Chief Complaint:  No chief complaint on file. {Click here for Visit Info    :1}    History of Present Illness:   Lauren Baker is a 82 y.o. 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 - received single chamber medtronic ppm 10/25/2018, also has HTN. Last saw Dr. Wyline Mood 06/2021 and doing well.   Saw Ms.Shirlean Kelly 02/2022 and stable.                     Past Medical History:  Diagnosis Date   Chronic atrial fibrillation (HCC)    Hypertension    Current Medications: No outpatient medications have been marked as taking for the 01/19/23 encounter (Appointment) with Dyann Kief, PA-C.    Allergies:   Patient has no known allergies.   Social History   Tobacco Use   Smoking status: Never   Smokeless tobacco: Never  Vaping Use   Vaping Use: Never used  Substance Use Topics   Alcohol use: No   Drug use: No    Family Hx: The patient's family history includes CVA in her mother; Cancer in her sister; Heart attack in her brother and sister; Hypertension in her sister.  ROS     Physical Exam:    VS:  There were no vitals taken for this visit.    Wt Readings from Last 3 Encounters:  03/04/22 185 lb 3.2 oz (84 kg)  02/18/22 188 lb (85.3 kg)  01/15/22 188 lb (85.3 kg)    Physical Exam  GEN: Well nourished, well developed, in no acute distress  HEENT: normal  Neck: no JVD, carotid bruits, or masses Cardiac:RRR; no murmurs, rubs, or gallops  Respiratory:  clear to auscultation bilaterally, normal work of breathing GI: soft, nontender, nondistended, +  BS Ext: without cyanosis, clubbing, or edema, Good distal pulses bilaterally MS: no deformity or atrophy  Skin: warm and dry, no rash Neuro:  Alert and Oriented x 3, Strength and sensation are intact Psych: euthymic mood, full affect        EKGs/Labs/Other Test Reviewed:    EKG:  EKG is *** ordered today.  The ekg ordered today demonstrates ***  Recent Labs: 03/04/2022: Hemoglobin 14.8; Platelets 184 04/16/2022: BUN 14; Creatinine, Ser 1.17; Potassium 3.9; Sodium 136   Recent Lipid Panel No results for input(s): "CHOL", "TRIG", "HDL", "VLDL", "LDLCALC", "LDLDIRECT" in the last 8760 hours.   Prior CV Studies: {Select studies to display:26339}  03/2018 echo Study Conclusions   - Left ventricle: The cavity size was normal. There was mild focal   basal hypertrophy of the septum. Systolic function was normal.   The estimated ejection fraction was in the range of 55% to 60%.   Wall motion was normal; there were no regional wall motion   abnormalities. - Aortic valve: There was mild regurgitation. - Mitral valve: Calcified annulus. There was mild regurgitation. - Left atrium: The atrium was moderately dilated. - Pulmonary arteries: Systolic pressure was mildly increased. PA   peak pressure: 40 mm Hg (S).   Impressions:   - Normal LV systolic function; calcified aortic valve with  mild AI;   mild MR; moderate LAE; mild TR with mild pulmonary hypertension.   10/2018 echo IMPRESSIONS      1. The left ventricle has hyperdynamic systolic function, with an ejection fraction of >65%. The cavity size was normal. There is mildly increased left ventricular wall thickness. Left ventricular diastolic function could not be evaluated secondary to  atrial fibrillation.  2. The right ventricle has normal systolic function. The cavity was normal. There is no increase in right ventricular wall thickness.  3. Left atrial size was mildly dilated.  4. Mild thickening of the mitral valve leaflet.  5.  The tricuspid valve is grossly normal.  6. The aortic valve is tricuspid Mild thickening of the aortic valve Aortic valve regurgitation is trivial by color flow Doppler.  7. The inferior vena cava was normal in size with <50% respiratory variability.  8. Vigorous LV systolic function; mild LVH; trace AI; mild MR; mild LAE; mild TR; moderately elevated pulmonary pressure.     Risk Assessment/Calculations/Metrics:   {Does this patient have ATRIAL FIBRILLATION?:[phone]}     No BP recorded.  {Refresh Note OR Click here to enter BP  :1}***    ASSESSMENT & PLAN:   No problem-specific Assessment & Plan notes found for this encounter.    Afib/Tachy-brady syndrome - doing well s/p ppm. No recent symptoms, upcoming device check last this month - continue current meds including anticoagulation with eliquis.     HTN -remains at goal, continue current meds   Pacemaker followed by device clinic.       {Are you ordering a CV Procedure (e.g. stress test, cath, DCCV, TEE, etc)?   Press F2        :[id number]}   Dispo:  No follow-ups on file.   Medication Adjustments/Labs and Tests Ordered: Current medicines are reviewed at length with the patient today.  Concerns regarding medicines are outlined above.  Tests Ordered: No orders of the defined types were placed in this encounter.  Medication Changes: No orders of the defined types were placed in this encounter.  Signed, Jacolyn Reedy, PA-C  01/07/2023 2:32 PM    [facility] [address] [place], [place], [place]  [address] Phone: [phone]; Fax: [phone]

## 2023-01-14 ENCOUNTER — Telehealth: Payer: Self-pay | Admitting: Cardiology

## 2023-01-14 DIAGNOSIS — R310 Gross hematuria: Secondary | ICD-10-CM | POA: Diagnosis not present

## 2023-01-14 NOTE — Telephone Encounter (Signed)
Please advise holding Eliquis prior to urology procedure to check tumor. This is a late request. Unsure if it will be able to be addressed today.   Thank you!  DW

## 2023-01-14 NOTE — Telephone Encounter (Signed)
Pt on Eliquis for afib.  CHA2DS2-VASc Score = 4  This indicates a 4.8% annual risk of stroke. The patient's score is based upon: CHF History: 0 HTN History: 1 Diabetes History: 0 Stroke History: 0 Vascular Disease History: 0 Age Score: 2 Gender Score: 1   CrCl 63mL/min Platelet count 184K  Pt can hold Eliquis tonight for procedure tomorrow.

## 2023-01-14 NOTE — Telephone Encounter (Signed)
Routing to [employer].

## 2023-01-14 NOTE — Telephone Encounter (Signed)
   Name: DEAH OTSUKA  DOB: 1940-12-20  MRN: [id number]   Primary Cardiologist: Dina Rich, MD  Spoke with patient's daughter Laurann Montana. Patient is having hematuria and the urologist would like to look in patient's bladder with a camera. Pharm D reviewed:  Pt on Eliquis for afib.   CHA2DS2-VASc Score = 4  This indicates a 4.8% annual risk of stroke. The patient's score is based upon: CHF History: 0 HTN History: 1 Diabetes History: 0 Stroke History: 0 Vascular Disease History: 0 Age Score: 2 Gender Score: 1   CrCl 104mL/min Platelet count 184K   Pt can hold Eliquis tonight for procedure tomorrow.  Patient will hold her Eliquis dose tonight and tomorrow morning before her procedure. Patient should be able to restart Eliquis tomorrow evening unless urology has further recommendations for holding it. Patient's daughter expressed understanding and will contact the office tomorrow if she has any further questions based on what urology finds.   Carlos Levering, NP 01/14/2023, 5:20 PM

## 2023-01-14 NOTE — Telephone Encounter (Signed)
Pt c/o medication issue:  1. Name of Medication:  apixaban (ELIQUIS) 5 MG TABS tablet   2. How are you currently taking this medication (dosage and times per day)?  As prescribed  3. Are you having a reaction (difficulty breathing--STAT)?   No  4. What is your medication issue?   Daughter stated patient's urologist, Dr. Alvester Morin wants to hold patient's blood thinner overnight as he wants to check for a tumor tomorrow as patient has been having blood in her urine.

## 2023-01-15 DIAGNOSIS — R310 Gross hematuria: Secondary | ICD-10-CM | POA: Diagnosis not present

## 2023-01-16 DIAGNOSIS — E538 Deficiency of other specified B group vitamins: Secondary | ICD-10-CM | POA: Diagnosis not present

## 2023-01-19 ENCOUNTER — Ambulatory Visit: Payer: Medicare HMO | Admitting: Physician Assistant

## 2023-01-19 NOTE — Progress Notes (Signed)
Remote pacemaker transmission.   

## 2023-01-29 DIAGNOSIS — K8020 Calculus of gallbladder without cholecystitis without obstruction: Secondary | ICD-10-CM | POA: Diagnosis not present

## 2023-01-29 DIAGNOSIS — N281 Cyst of kidney, acquired: Secondary | ICD-10-CM | POA: Diagnosis not present

## 2023-01-29 DIAGNOSIS — N200 Calculus of kidney: Secondary | ICD-10-CM | POA: Diagnosis not present

## 2023-01-29 DIAGNOSIS — K449 Diaphragmatic hernia without obstruction or gangrene: Secondary | ICD-10-CM | POA: Diagnosis not present

## 2023-01-29 DIAGNOSIS — R310 Gross hematuria: Secondary | ICD-10-CM | POA: Diagnosis not present

## 2023-02-04 NOTE — Telephone Encounter (Signed)
Pt's daughter called back to discuss further about restarting Eliquis for her mom. In our conversation pt's daughter Marylu Lund does tell me that the pt had  procedure to look into the bladder to be sure no tumor which then the pt was advised on that day 01/14/23 to hold her Eliquis that day and the next day for the procedure.   See notes from Carlos Levering, NP: Pt can hold Eliquis tonight for procedure tomorrow.   Patient will hold her Eliquis dose tonight and tomorrow morning before her procedure. Patient should be able to restart Eliquis tomorrow evening unless urology has further recommendations for holding it. Patient's daughter expressed understanding and will contact the office tomorrow if she has any further questions based on what urology finds.    Carlos Levering, NP 01/14/2023, 5:20 PM   Marylu Lund, then states Dr. Alvester Morin ordered a pelvic CT for the pt. This was done on 01/29/23. Marylu Lund states they have a follow up with Dr. Alvester Morin 02/26/23 and were told they will go over the results of the CT then. Marylu Lund is concerned as her mother is still off Eliquis. I assured her that I will also send this to Dr. Wyline Mood for his input as well as to Dr. Alvester Morin. I stated that either Dr. Verna Czech nurse or Dr. Mayer Masker nurse should call them tomorrow.   Marylu Lund states they thought they were told pt could resume Eliquis once the blood in her urine cleared up, but they are not positive if this is what they were told. Marylu Lund thanked me for my help in this matter for her mom (the pt). Marylu Lund asked that the call back come to her as her mother gets things confused at times. PH# [phone].

## 2023-02-04 NOTE — Telephone Encounter (Signed)
Left message to call back to advise they will need to get the ok to resume from Dr. Alvester Morin. Once the procedure is done, Dr. Alvester Morin will advise when safe to resume blood thinner. I will also fax this note to Dr. Shannan Harper office as well.

## 2023-02-04 NOTE — Telephone Encounter (Signed)
Patient's daughter is requesting call back to discuss when to start the Eliquis. Please advise.

## 2023-02-05 NOTE — Telephone Encounter (Signed)
Restart eliquis when ok with urology, if bleeding resolved and they are comfortable chances of recurrence are low then ok to restart  Dominga Ferry MD

## 2023-02-05 NOTE — Telephone Encounter (Signed)
I s/w the pt's daughter Lauren Baker and informed her of the notes from Dr. Wyline Mood:  Restart eliquis when ok with urology, if bleeding resolved and they are comfortable chances of recurrence are low then ok to restart   Dominga Ferry MD    I assured Lauren Baker that I will fax these notes to Dr. Alvester Morin to reach out to her to let her know when pt can resume her blood thinner.

## 2023-02-16 DIAGNOSIS — E538 Deficiency of other specified B group vitamins: Secondary | ICD-10-CM | POA: Diagnosis not present

## 2023-02-26 DIAGNOSIS — N200 Calculus of kidney: Secondary | ICD-10-CM | POA: Diagnosis not present

## 2023-02-26 DIAGNOSIS — R310 Gross hematuria: Secondary | ICD-10-CM | POA: Diagnosis not present

## 2023-02-26 DIAGNOSIS — D413 Neoplasm of uncertain behavior of urethra: Secondary | ICD-10-CM | POA: Diagnosis not present

## 2023-03-09 NOTE — Progress Notes (Signed)
  Cardiology Office Note:  .   Date:  03/23/2023  ID:  Lauren Baker, Lauren Baker [birth year], MRN [id number] PCP: Elfredia Nevins, MD  [facility] Providers Cardiologist:  Dina Rich, MD    History of Present Illness: .   Lauren Baker is a 82 y.o. female with history of permanent Afib, PPM, HTN. Last saw Dr. Wyline Mood 06/2021 also followed by device clinic.  Patient comes in with her daughter. She had hematuria and was off eliquis for about a month. She has very small kidney stones. She is back on eliquis and she had some dark urine and she stopped eliquis on her own for 2 days and then restarted it. No cardiac complaints.   ROS:    Studies Reviewed: Marland Kitchen         Prior CV Studies:      Risk Assessment/Calculations:    CHA2DS2-VASc Score = 4   This indicates a 4.8% annual risk of stroke. The patient's score is based upon: CHF History: 0 HTN History: 1 Diabetes History: 0 Stroke History: 0 Vascular Disease History: 0 Age Score: 2 Gender Score: 1            Physical Exam:   VS:  BP 118/72   Pulse (!) 53   Ht 5' 8.5" (1.74 m)   Wt 183 lb (83 kg)   SpO2 95%   BMI 27.42 kg/m    Wt Readings from Last 3 Encounters:  03/23/23 183 lb (83 kg)  03/04/22 185 lb 3.2 oz (84 kg)  02/18/22 188 lb (85.3 kg)    GEN: Well nourished, well developed in no acute distress NECK: No JVD; No carotid bruits CARDIAC:  irreg no murmurs, rubs, gallops RESPIRATORY:  Clear to auscultation without rales, wheezing or rhonchi  ABDOMEN: Soft, non-tender, non-distended EXTREMITIES:  No edema; No deformity   ASSESSMENT AND PLAN: .   Permanent Afib on eliquis-recent urologic bleeding problems on eliquis and was off for a month. She is back on it and currently tolerating it. She will continue to monitor. Labs on KPN reviewed and stable Crt and Hgb. -on metoprolol 200 mg daily and diltiazem 300 mg daily.  PPM for tachy/brady 2020-remote pacer check yest isn't reviewed yet. Continue  metoprolol and diltiazem. F/u with Dr. Elberta Fortis in Oct.   HTN BP well controlled        Dispo: f/u Dr. Wyline Mood 6 months.   Signed, Jacolyn Reedy, PA-C

## 2023-03-16 DIAGNOSIS — E538 Deficiency of other specified B group vitamins: Secondary | ICD-10-CM | POA: Diagnosis not present

## 2023-03-23 ENCOUNTER — Ambulatory Visit: Payer: Medicare HMO | Attending: Physician Assistant | Admitting: Physician Assistant

## 2023-03-23 ENCOUNTER — Encounter: Payer: Self-pay | Admitting: Physician Assistant

## 2023-03-23 ENCOUNTER — Ambulatory Visit (INDEPENDENT_AMBULATORY_CARE_PROVIDER_SITE_OTHER): Payer: Medicare HMO

## 2023-03-23 VITALS — BP 118/72 | HR 53 | Ht 68.5 in | Wt 183.0 lb

## 2023-03-23 DIAGNOSIS — I495 Sick sinus syndrome: Secondary | ICD-10-CM | POA: Diagnosis not present

## 2023-03-23 DIAGNOSIS — I10 Essential (primary) hypertension: Secondary | ICD-10-CM

## 2023-03-23 DIAGNOSIS — I4821 Permanent atrial fibrillation: Secondary | ICD-10-CM

## 2023-03-23 DIAGNOSIS — Z950 Presence of cardiac pacemaker: Secondary | ICD-10-CM | POA: Diagnosis not present

## 2023-03-23 NOTE — Patient Instructions (Signed)
Medication Instructions:  Your physician recommends that you continue on your current medications as directed. Please refer to the Current Medication list given to you today.   Labwork: None today  Testing/Procedures: None today  Follow-Up: 6 months Dr.Branch  Any Other Special Instructions Will Be Listed Below (If Applicable).  If you need a refill on your cardiac medications before your next appointment, please call your pharmacy.

## 2023-03-24 LAB — CUP PACEART REMOTE DEVICE CHECK
Battery Remaining Longevity: 138 mo
Battery Voltage: 3.02 V
Brady Statistic RV Percent Paced: 14.97 %
Date Time Interrogation Session: 20240813090208
Implantable Lead Connection Status: 753985
Implantable Lead Implant Date: 20200316
Implantable Lead Location: 753860
Implantable Lead Model: 5076
Implantable Pulse Generator Implant Date: 20200316
Lead Channel Impedance Value: 361 Ohm
Lead Channel Impedance Value: 399 Ohm
Lead Channel Pacing Threshold Amplitude: 0.5 V
Lead Channel Pacing Threshold Pulse Width: 0.4 ms
Lead Channel Sensing Intrinsic Amplitude: 8.25 mV
Lead Channel Sensing Intrinsic Amplitude: 8.25 mV
Lead Channel Setting Pacing Amplitude: 2.5 V
Lead Channel Setting Pacing Pulse Width: 0.4 ms
Lead Channel Setting Sensing Sensitivity: 1.2 mV
Zone Setting Status: 755011

## 2023-04-06 NOTE — Progress Notes (Signed)
Remote pacemaker transmission.   

## 2023-04-16 DIAGNOSIS — E538 Deficiency of other specified B group vitamins: Secondary | ICD-10-CM | POA: Diagnosis not present

## 2023-05-13 DIAGNOSIS — E538 Deficiency of other specified B group vitamins: Secondary | ICD-10-CM | POA: Diagnosis not present

## 2023-06-02 ENCOUNTER — Encounter: Payer: Medicare HMO | Admitting: Cardiology

## 2023-06-17 DIAGNOSIS — E538 Deficiency of other specified B group vitamins: Secondary | ICD-10-CM | POA: Diagnosis not present

## 2023-06-22 ENCOUNTER — Ambulatory Visit (INDEPENDENT_AMBULATORY_CARE_PROVIDER_SITE_OTHER): Payer: Medicare HMO

## 2023-06-22 DIAGNOSIS — I495 Sick sinus syndrome: Secondary | ICD-10-CM

## 2023-06-23 LAB — CUP PACEART REMOTE DEVICE CHECK
Battery Remaining Longevity: 135 mo
Battery Voltage: 3.01 V
Brady Statistic RV Percent Paced: 16.88 %
Date Time Interrogation Session: 20241110234523
Implantable Lead Connection Status: 753985
Implantable Lead Implant Date: 20200316
Implantable Lead Location: 753860
Implantable Lead Model: 5076
Implantable Pulse Generator Implant Date: 20200316
Lead Channel Impedance Value: 380 Ohm
Lead Channel Impedance Value: 418 Ohm
Lead Channel Pacing Threshold Amplitude: 0.5 V
Lead Channel Pacing Threshold Pulse Width: 0.4 ms
Lead Channel Sensing Intrinsic Amplitude: 12.625 mV
Lead Channel Sensing Intrinsic Amplitude: 12.625 mV
Lead Channel Setting Pacing Amplitude: 2.5 V
Lead Channel Setting Pacing Pulse Width: 0.4 ms
Lead Channel Setting Sensing Sensitivity: 1.2 mV
Zone Setting Status: 755011

## 2023-06-26 DIAGNOSIS — F419 Anxiety disorder, unspecified: Secondary | ICD-10-CM | POA: Diagnosis not present

## 2023-06-26 DIAGNOSIS — F330 Major depressive disorder, recurrent, mild: Secondary | ICD-10-CM | POA: Diagnosis not present

## 2023-06-26 DIAGNOSIS — I10 Essential (primary) hypertension: Secondary | ICD-10-CM | POA: Diagnosis not present

## 2023-06-26 DIAGNOSIS — I4891 Unspecified atrial fibrillation: Secondary | ICD-10-CM | POA: Diagnosis not present

## 2023-07-17 NOTE — Progress Notes (Signed)
Remote pacemaker transmission.   

## 2023-08-18 DIAGNOSIS — F419 Anxiety disorder, unspecified: Secondary | ICD-10-CM | POA: Diagnosis not present

## 2023-08-18 DIAGNOSIS — E559 Vitamin D deficiency, unspecified: Secondary | ICD-10-CM | POA: Diagnosis not present

## 2023-08-18 DIAGNOSIS — Z1331 Encounter for screening for depression: Secondary | ICD-10-CM | POA: Diagnosis not present

## 2023-08-18 DIAGNOSIS — E039 Hypothyroidism, unspecified: Secondary | ICD-10-CM | POA: Diagnosis not present

## 2023-08-18 DIAGNOSIS — I4891 Unspecified atrial fibrillation: Secondary | ICD-10-CM | POA: Diagnosis not present

## 2023-08-18 DIAGNOSIS — F330 Major depressive disorder, recurrent, mild: Secondary | ICD-10-CM | POA: Diagnosis not present

## 2023-08-18 DIAGNOSIS — Z0000 Encounter for general adult medical examination without abnormal findings: Secondary | ICD-10-CM | POA: Diagnosis not present

## 2023-08-18 DIAGNOSIS — R7309 Other abnormal glucose: Secondary | ICD-10-CM | POA: Diagnosis not present

## 2023-08-18 DIAGNOSIS — E538 Deficiency of other specified B group vitamins: Secondary | ICD-10-CM | POA: Diagnosis not present

## 2023-08-18 DIAGNOSIS — Z6824 Body mass index (BMI) 24.0-24.9, adult: Secondary | ICD-10-CM | POA: Diagnosis not present

## 2023-08-18 DIAGNOSIS — I10 Essential (primary) hypertension: Secondary | ICD-10-CM | POA: Diagnosis not present

## 2023-08-18 DIAGNOSIS — Z0001 Encounter for general adult medical examination with abnormal findings: Secondary | ICD-10-CM | POA: Diagnosis not present

## 2023-08-20 ENCOUNTER — Ambulatory Visit: Payer: Medicare HMO | Admitting: Cardiology

## 2023-08-20 DIAGNOSIS — E538 Deficiency of other specified B group vitamins: Secondary | ICD-10-CM | POA: Diagnosis not present

## 2023-09-17 ENCOUNTER — Encounter: Payer: Medicare HMO | Admitting: Physician Assistant

## 2023-09-21 ENCOUNTER — Emergency Department (HOSPITAL_COMMUNITY): Payer: Medicare HMO

## 2023-09-21 ENCOUNTER — Inpatient Hospital Stay (HOSPITAL_COMMUNITY)
Admission: EM | Admit: 2023-09-21 | Discharge: 2023-09-24 | DRG: 193 | Disposition: A | Discharge status: Skilled Nursing Facility | Payer: Medicare HMO | Attending: Internal Medicine | Admitting: Internal Medicine

## 2023-09-21 ENCOUNTER — Encounter (HOSPITAL_COMMUNITY): Payer: Self-pay | Admitting: Emergency Medicine

## 2023-09-21 ENCOUNTER — Other Ambulatory Visit: Payer: Self-pay

## 2023-09-21 DIAGNOSIS — R000 Tachycardia, unspecified: Secondary | ICD-10-CM | POA: Diagnosis not present

## 2023-09-21 DIAGNOSIS — N1832 Chronic kidney disease, stage 3b: Secondary | ICD-10-CM | POA: Diagnosis present

## 2023-09-21 DIAGNOSIS — E871 Hypo-osmolality and hyponatremia: Secondary | ICD-10-CM | POA: Diagnosis present

## 2023-09-21 DIAGNOSIS — I482 Chronic atrial fibrillation, unspecified: Secondary | ICD-10-CM | POA: Diagnosis present

## 2023-09-21 DIAGNOSIS — Z823 Family history of stroke: Secondary | ICD-10-CM

## 2023-09-21 DIAGNOSIS — Z7901 Long term (current) use of anticoagulants: Secondary | ICD-10-CM | POA: Diagnosis not present

## 2023-09-21 DIAGNOSIS — J9601 Acute respiratory failure with hypoxia: Principal | ICD-10-CM | POA: Diagnosis present

## 2023-09-21 DIAGNOSIS — I129 Hypertensive chronic kidney disease with stage 1 through stage 4 chronic kidney disease, or unspecified chronic kidney disease: Secondary | ICD-10-CM | POA: Diagnosis not present

## 2023-09-21 DIAGNOSIS — Z79899 Other long term (current) drug therapy: Secondary | ICD-10-CM | POA: Diagnosis not present

## 2023-09-21 DIAGNOSIS — F32A Depression, unspecified: Secondary | ICD-10-CM | POA: Diagnosis present

## 2023-09-21 DIAGNOSIS — E039 Hypothyroidism, unspecified: Secondary | ICD-10-CM | POA: Diagnosis not present

## 2023-09-21 DIAGNOSIS — J189 Pneumonia, unspecified organism: Secondary | ICD-10-CM | POA: Diagnosis not present

## 2023-09-21 DIAGNOSIS — Z950 Presence of cardiac pacemaker: Secondary | ICD-10-CM | POA: Diagnosis not present

## 2023-09-21 DIAGNOSIS — R531 Weakness: Secondary | ICD-10-CM | POA: Diagnosis not present

## 2023-09-21 DIAGNOSIS — Z8249 Family history of ischemic heart disease and other diseases of the circulatory system: Secondary | ICD-10-CM | POA: Diagnosis not present

## 2023-09-21 DIAGNOSIS — I10 Essential (primary) hypertension: Secondary | ICD-10-CM | POA: Diagnosis not present

## 2023-09-21 DIAGNOSIS — Z79890 Hormone replacement therapy (postmenopausal): Secondary | ICD-10-CM | POA: Diagnosis not present

## 2023-09-21 DIAGNOSIS — Z809 Family history of malignant neoplasm, unspecified: Secondary | ICD-10-CM | POA: Diagnosis not present

## 2023-09-21 DIAGNOSIS — E876 Hypokalemia: Secondary | ICD-10-CM | POA: Diagnosis present

## 2023-09-21 DIAGNOSIS — K828 Other specified diseases of gallbladder: Secondary | ICD-10-CM | POA: Diagnosis not present

## 2023-09-21 DIAGNOSIS — I4821 Permanent atrial fibrillation: Secondary | ICD-10-CM | POA: Diagnosis present

## 2023-09-21 DIAGNOSIS — Z1152 Encounter for screening for COVID-19: Secondary | ICD-10-CM | POA: Diagnosis not present

## 2023-09-21 DIAGNOSIS — Z95810 Presence of automatic (implantable) cardiac defibrillator: Secondary | ICD-10-CM | POA: Diagnosis not present

## 2023-09-21 DIAGNOSIS — K838 Other specified diseases of biliary tract: Secondary | ICD-10-CM | POA: Diagnosis not present

## 2023-09-21 DIAGNOSIS — I4891 Unspecified atrial fibrillation: Secondary | ICD-10-CM | POA: Diagnosis not present

## 2023-09-21 DIAGNOSIS — R140 Abdominal distension (gaseous): Secondary | ICD-10-CM | POA: Diagnosis not present

## 2023-09-21 DIAGNOSIS — K760 Fatty (change of) liver, not elsewhere classified: Secondary | ICD-10-CM | POA: Diagnosis not present

## 2023-09-21 DIAGNOSIS — J9611 Chronic respiratory failure with hypoxia: Secondary | ICD-10-CM | POA: Diagnosis not present

## 2023-09-21 DIAGNOSIS — R0902 Hypoxemia: Secondary | ICD-10-CM | POA: Diagnosis not present

## 2023-09-21 DIAGNOSIS — K8020 Calculus of gallbladder without cholecystitis without obstruction: Secondary | ICD-10-CM | POA: Diagnosis present

## 2023-09-21 DIAGNOSIS — I2510 Atherosclerotic heart disease of native coronary artery without angina pectoris: Secondary | ICD-10-CM | POA: Diagnosis not present

## 2023-09-21 DIAGNOSIS — K219 Gastro-esophageal reflux disease without esophagitis: Secondary | ICD-10-CM | POA: Diagnosis present

## 2023-09-21 DIAGNOSIS — J984 Other disorders of lung: Secondary | ICD-10-CM | POA: Diagnosis not present

## 2023-09-21 DIAGNOSIS — R0602 Shortness of breath: Secondary | ICD-10-CM | POA: Diagnosis not present

## 2023-09-21 DIAGNOSIS — I517 Cardiomegaly: Secondary | ICD-10-CM | POA: Diagnosis not present

## 2023-09-21 DIAGNOSIS — J9691 Respiratory failure, unspecified with hypoxia: Principal | ICD-10-CM | POA: Diagnosis present

## 2023-09-21 LAB — CBC WITH DIFFERENTIAL/PLATELET
Abs Immature Granulocytes: 0.04 10*3/uL (ref 0.00–0.07)
Basophils Absolute: 0.1 10*3/uL (ref 0.0–0.1)
Basophils Relative: 1 %
Eosinophils Absolute: 0.3 10*3/uL (ref 0.0–0.5)
Eosinophils Relative: 4 %
HCT: 37.5 % (ref 36.0–46.0)
Hemoglobin: 12.8 g/dL (ref 12.0–15.0)
Immature Granulocytes: 1 %
Lymphocytes Relative: 10 %
Lymphs Abs: 0.7 10*3/uL (ref 0.7–4.0)
MCH: 31.1 pg (ref 26.0–34.0)
MCHC: 34.1 g/dL (ref 30.0–36.0)
MCV: 91 fL (ref 80.0–100.0)
Monocytes Absolute: 0.8 10*3/uL (ref 0.1–1.0)
Monocytes Relative: 11 %
Neutro Abs: 5.1 10*3/uL (ref 1.7–7.7)
Neutrophils Relative %: 73 %
Platelets: 272 10*3/uL (ref 150–400)
RBC: 4.12 MIL/uL (ref 3.87–5.11)
RDW: 14.6 % (ref 11.5–15.5)
WBC: 6.9 10*3/uL (ref 4.0–10.5)
nRBC: 0 % (ref 0.0–0.2)

## 2023-09-21 LAB — URINALYSIS, W/ REFLEX TO CULTURE (INFECTION SUSPECTED)
Bilirubin Urine: NEGATIVE
Glucose, UA: NEGATIVE mg/dL
Ketones, ur: NEGATIVE mg/dL
Nitrite: NEGATIVE
Protein, ur: 30 mg/dL — AB
Specific Gravity, Urine: 1.024 (ref 1.005–1.030)
pH: 6 (ref 5.0–8.0)

## 2023-09-21 LAB — RESPIRATORY PANEL BY PCR

## 2023-09-21 LAB — RESP PANEL BY RT-PCR (RSV, FLU A&B, COVID)  RVPGX2
Influenza A by PCR: NEGATIVE
Influenza B by PCR: NEGATIVE
Resp Syncytial Virus by PCR: NEGATIVE
SARS Coronavirus 2 by RT PCR: NEGATIVE

## 2023-09-21 LAB — BASIC METABOLIC PANEL
Anion gap: 11 (ref 5–15)
BUN: 16 mg/dL (ref 8–23)
CO2: 28 mmol/L (ref 22–32)
Calcium: 8.7 mg/dL — ABNORMAL LOW (ref 8.9–10.3)
Chloride: 95 mmol/L — ABNORMAL LOW (ref 98–111)
Creatinine, Ser: 1.24 mg/dL — ABNORMAL HIGH (ref 0.44–1.00)
GFR, Estimated: 44 mL/min — ABNORMAL LOW (ref >60)
Glucose, Bld: 170 mg/dL — ABNORMAL HIGH (ref 70–99)
Potassium: 2.9 mmol/L — ABNORMAL LOW (ref 3.5–5.1)
Sodium: 134 mmol/L — ABNORMAL LOW (ref 135–145)

## 2023-09-21 LAB — BRAIN NATRIURETIC PEPTIDE: B Natriuretic Peptide: 287 pg/mL — ABNORMAL HIGH (ref 0.0–100.0)

## 2023-09-21 LAB — HEPATIC FUNCTION PANEL
ALT: 20 U/L (ref 0–44)
AST: 27 U/L (ref 15–41)
Albumin: 2.6 g/dL — ABNORMAL LOW (ref 3.5–5.0)
Alkaline Phosphatase: 90 U/L (ref 38–126)
Bilirubin, Direct: 1.1 mg/dL — ABNORMAL HIGH (ref 0.0–0.2)
Indirect Bilirubin: 2.3 mg/dL — ABNORMAL HIGH (ref 0.3–0.9)
Total Bilirubin: 3.4 mg/dL — ABNORMAL HIGH (ref 0.0–1.2)
Total Protein: 6.6 g/dL (ref 6.5–8.1)

## 2023-09-21 LAB — TROPONIN I (HIGH SENSITIVITY)
Troponin I (High Sensitivity): 13 ng/L (ref <18)
Troponin I (High Sensitivity): 11 ng/L (ref <18)

## 2023-09-21 LAB — MAGNESIUM: Magnesium: 1.3 mg/dL — ABNORMAL LOW (ref 1.7–2.4)

## 2023-09-21 LAB — STREP PNEUMONIAE URINARY ANTIGEN: Strep Pneumo Urinary Antigen: NEGATIVE

## 2023-09-21 LAB — D-DIMER, QUANTITATIVE: D-Dimer, Quant: 0.98 ug{FEU}/mL — ABNORMAL HIGH (ref 0.00–0.50)

## 2023-09-21 LAB — TSH: TSH: 1.629 u[IU]/mL (ref 0.350–4.500)

## 2023-09-21 LAB — PHOSPHORUS: Phosphorus: 2.5 mg/dL (ref 2.5–4.6)

## 2023-09-21 MED ORDER — APIXABAN 5 MG PO TABS
5.0000 mg | ORAL_TABLET | Freq: Two times a day (BID) | ORAL | Status: DC
  Administered 2023-09-21 – 2023-09-22 (×2): 5 mg via ORAL
  Filled 2023-09-21 (×2): qty 1

## 2023-09-21 MED ORDER — SODIUM CHLORIDE 0.9 % IV SOLN: 250.0000 mL | INTRAVENOUS | Status: AC | PRN

## 2023-09-21 MED ORDER — BISACODYL 10 MG RE SUPP: 10.0000 mg | Freq: Every day | RECTAL | Status: DC | PRN

## 2023-09-21 MED ORDER — BUPROPION HCL 100 MG PO TABS
100.0000 mg | ORAL_TABLET | Freq: Three times a day (TID) | ORAL | Status: DC
Start: 2023-09-21 — End: 2023-09-21
  Filled 2023-09-21 (×5): qty 1

## 2023-09-21 MED ORDER — IOHEXOL 350 MG/ML SOLN
60.0000 mL | Freq: Once | INTRAVENOUS | Status: AC | PRN
Start: 2023-09-21 — End: 2023-09-21
  Administered 2023-09-21: 60 mL via INTRAVENOUS

## 2023-09-21 MED ORDER — SODIUM CHLORIDE 0.9 % IV SOLN
500.0000 mg | INTRAVENOUS | Status: DC
  Administered 2023-09-21 – 2023-09-22 (×2): 500 mg via INTRAVENOUS
  Filled 2023-09-21 (×2): qty 5

## 2023-09-21 MED ORDER — LEVOTHYROXINE SODIUM 25 MCG PO TABS
25.0000 ug | ORAL_TABLET | Freq: Every day | ORAL | Status: DC
  Administered 2023-09-22 – 2023-09-24 (×3): 25 ug via ORAL
  Filled 2023-09-21 (×3): qty 1

## 2023-09-21 MED ORDER — IPRATROPIUM-ALBUTEROL 0.5-2.5 (3) MG/3ML IN SOLN: 3.0000 mL | Freq: Four times a day (QID) | RESPIRATORY_TRACT | Status: DC | PRN

## 2023-09-21 MED ORDER — ONDANSETRON HCL 4 MG/2ML IJ SOLN: 4.0000 mg | Freq: Four times a day (QID) | INTRAMUSCULAR | Status: DC | PRN

## 2023-09-21 MED ORDER — BUPROPION HCL 100 MG PO TABS
100.0000 mg | ORAL_TABLET | Freq: Three times a day (TID) | ORAL | Status: DC
  Administered 2023-09-21 – 2023-09-24 (×9): 100 mg via ORAL
  Filled 2023-09-21 (×12): qty 1

## 2023-09-21 MED ORDER — BUPROPION HCL 100 MG PO TABS
100.0000 mg | ORAL_TABLET | Freq: Once | ORAL | Status: DC
  Filled 2023-09-21: qty 1

## 2023-09-21 MED ORDER — QUETIAPINE FUMARATE 25 MG PO TABS
50.0000 mg | ORAL_TABLET | Freq: Every day | ORAL | Status: DC
  Administered 2023-09-21 – 2023-09-23 (×3): 50 mg via ORAL
  Filled 2023-09-21 (×3): qty 2

## 2023-09-21 MED ORDER — LACTATED RINGERS IV BOLUS
1000.0000 mL | Freq: Once | INTRAVENOUS | Status: AC
  Administered 2023-09-21: 1000 mL via INTRAVENOUS

## 2023-09-21 MED ORDER — ACETAMINOPHEN 325 MG PO TABS: 650.0000 mg | ORAL_TABLET | Freq: Four times a day (QID) | ORAL | Status: DC | PRN

## 2023-09-21 MED ORDER — POLYETHYLENE GLYCOL 3350 17 G PO PACK
17.0000 g | PACK | Freq: Two times a day (BID) | ORAL | Status: DC
  Administered 2023-09-22 – 2023-09-23 (×3): 17 g via ORAL
  Filled 2023-09-21 (×4): qty 1

## 2023-09-21 MED ORDER — PREDNISONE 20 MG PO TABS: 40.0000 mg | ORAL_TABLET | Freq: Every day | ORAL | Status: DC

## 2023-09-21 MED ORDER — GUAIFENESIN ER 600 MG PO TB12
600.0000 mg | ORAL_TABLET | Freq: Two times a day (BID) | ORAL | Status: DC
  Administered 2023-09-21: 600 mg via ORAL
  Filled 2023-09-21: qty 1

## 2023-09-21 MED ORDER — POTASSIUM CHLORIDE CRYS ER 20 MEQ PO TBCR
40.0000 meq | EXTENDED_RELEASE_TABLET | Freq: Once | ORAL | Status: AC
Start: 2023-09-21 — End: 2023-09-21
  Administered 2023-09-21: 40 meq via ORAL
  Filled 2023-09-21: qty 2

## 2023-09-21 MED ORDER — METHYLPREDNISOLONE SODIUM SUCC 40 MG IJ SOLR
40.0000 mg | Freq: Once | INTRAMUSCULAR | Status: AC
  Administered 2023-09-21: 40 mg via INTRAVENOUS
  Filled 2023-09-21: qty 1

## 2023-09-21 MED ORDER — BISACODYL 5 MG PO TBEC
10.0000 mg | DELAYED_RELEASE_TABLET | Freq: Every day | ORAL | Status: DC
  Administered 2023-09-22: 10 mg via ORAL
  Filled 2023-09-21: qty 2

## 2023-09-21 MED ORDER — ACETAMINOPHEN 650 MG RE SUPP: 650.0000 mg | Freq: Four times a day (QID) | RECTAL | Status: DC | PRN

## 2023-09-21 MED ORDER — SODIUM CHLORIDE 0.9% FLUSH
3.0000 mL | Freq: Two times a day (BID) | INTRAVENOUS | Status: DC
  Administered 2023-09-21 – 2023-09-24 (×4): 3 mL via INTRAVENOUS

## 2023-09-21 MED ORDER — SODIUM CHLORIDE 0.9% FLUSH
3.0000 mL | Freq: Two times a day (BID) | INTRAVENOUS | Status: DC
  Administered 2023-09-22 – 2023-09-24 (×4): 3 mL via INTRAVENOUS

## 2023-09-21 MED ORDER — SODIUM CHLORIDE 0.9% FLUSH: 3.0000 mL | INTRAVENOUS | Status: DC | PRN

## 2023-09-21 MED ORDER — ENOXAPARIN SODIUM 40 MG/0.4ML IJ SOSY: 40.0000 mg | PREFILLED_SYRINGE | INTRAMUSCULAR | Status: DC

## 2023-09-21 MED ORDER — MAGNESIUM SULFATE 2 GM/50ML IV SOLN
2.0000 g | Freq: Once | INTRAVENOUS | Status: DC
  Filled 2023-09-21: qty 50

## 2023-09-21 MED ORDER — CLONAZEPAM 0.5 MG PO TABS
0.5000 mg | ORAL_TABLET | Freq: Two times a day (BID) | ORAL | Status: DC
  Administered 2023-09-21 – 2023-09-24 (×6): 0.5 mg via ORAL
  Filled 2023-09-21 (×6): qty 1

## 2023-09-21 MED ORDER — LOSARTAN POTASSIUM 50 MG PO TABS
50.0000 mg | ORAL_TABLET | Freq: Every day | ORAL | Status: DC
  Administered 2023-09-22 – 2023-09-24 (×3): 50 mg via ORAL
  Filled 2023-09-21 (×3): qty 1

## 2023-09-21 MED ORDER — MAGNESIUM SULFATE 2 GM/50ML IV SOLN
2.0000 g | Freq: Once | INTRAVENOUS | Status: AC
  Administered 2023-09-21: 2 g via INTRAVENOUS
  Filled 2023-09-21: qty 50

## 2023-09-21 MED ORDER — DILTIAZEM HCL ER COATED BEADS 300 MG PO CP24
300.0000 mg | ORAL_CAPSULE | Freq: Every day | ORAL | Status: DC
Start: 2023-09-22 — End: 2023-09-24
  Administered 2023-09-22 – 2023-09-24 (×3): 300 mg via ORAL
  Filled 2023-09-21 (×3): qty 1

## 2023-09-21 MED ORDER — METOPROLOL SUCCINATE ER 25 MG PO TB24
200.0000 mg | ORAL_TABLET | Freq: Every day | ORAL | Status: DC
  Administered 2023-09-22 – 2023-09-24 (×3): 200 mg via ORAL
  Filled 2023-09-21 (×4): qty 8

## 2023-09-21 MED ORDER — POTASSIUM CHLORIDE CRYS ER 20 MEQ PO TBCR
40.0000 meq | EXTENDED_RELEASE_TABLET | Freq: Once | ORAL | Status: AC
  Administered 2023-09-21: 40 meq via ORAL
  Filled 2023-09-21: qty 2

## 2023-09-21 MED ORDER — POTASSIUM CHLORIDE 10 MEQ/100ML IV SOLN
10.0000 meq | Freq: Once | INTRAVENOUS | Status: AC
  Administered 2023-09-21: 10 meq via INTRAVENOUS
  Filled 2023-09-21: qty 100

## 2023-09-21 MED ORDER — DULOXETINE HCL 30 MG PO CPEP
30.0000 mg | ORAL_CAPSULE | Freq: Every day | ORAL | Status: DC
Start: 2023-09-22 — End: 2023-09-24
  Administered 2023-09-22 – 2023-09-24 (×3): 30 mg via ORAL
  Filled 2023-09-21 (×3): qty 1

## 2023-09-21 MED ORDER — ONDANSETRON HCL 4 MG PO TABS: 4.0000 mg | ORAL_TABLET | Freq: Four times a day (QID) | ORAL | Status: DC | PRN

## 2023-09-21 NOTE — ED Provider Notes (Signed)
[place] EMERGENCY DEPARTMENT AT [facility] Provider Note   CSN: [id number] Arrival date & time: 09/21/23  1041     History  Chief Complaint  Patient presents with   Weakness    Lauren Baker is a 83 y.o. female.  HPI     83 year old female brought into the emergency room by family members because of increasing shortness of breath and worsening weakness.  Patient has known history of chronic A-fib.  She lives at home by herself.  According to family, over the last 2 or 3 weeks, patient has steadily declined.  She has significantly reduced her oral intake.  She is getting weaker by the day and has difficulty with ADLs.  She gets short of breath and winded easily when walking around the house.  Since patient was not improving, they called EMS today.  When EMS arrived, patient was found to have heart rate in the 130s, O2 sats at 88.  Patient has history of A-fib, for which she takes Eliquis.  No history of PE, DVT.  Patient has a pacemaker in place.  Patient denies any chest pain.  States that she has to force herself to eat.  She is having overall generalized weakness.   Home Medications Prior to Admission medications   Medication Sig Start Date End Date Taking? Authorizing Provider  apixaban (ELIQUIS) 5 MG TABS tablet TAKE 1 TABLET BY MOUTH TWICE DAILY. 07/03/21  Yes Antoine Poche, MD  buPROPion (WELLBUTRIN) 100 MG tablet Take 100 mg by mouth 3 (three) times daily. 02/26/18  Yes [provider]  cholecalciferol (VITAMIN D) 1000 units tablet Take 1,000 Units by mouth daily.   Yes [provider]  clonazePAM (KLONOPIN) 0.5 MG tablet Take 0.5 mg by mouth 2 (two) times daily. 01/19/18  Yes [provider]  diltiazem (CARDIZEM CD) 300 MG 24 hr capsule Take 1 capsule (300 mg total) by mouth daily. 09/29/22  Yes BranchDorothe Pea, MD  DULoxetine (CYMBALTA) 30 MG capsule Take 30 mg by mouth daily.  12/13/17  Yes [provider]   levothyroxine (SYNTHROID) 25 MCG tablet Take 25 mcg by mouth daily. 11/27/21  Yes [provider]  losartan (COZAAR) 50 MG tablet Take 1 tablet (50 mg total) by mouth daily. 09/29/22  Yes BranchDorothe Pea, MD  metoprolol (TOPROL-XL) 200 MG 24 hr tablet Take 1 tablet (200 mg total) by mouth daily. 09/29/22  Yes BranchDorothe Pea, MD  pantoprazole (PROTONIX) 40 MG tablet Take 1 tablet (40 mg total) by mouth daily. 09/24/23 09/23/24 Yes Vassie Loll, MD  predniSONE (DELTASONE) 20 MG tablet Take 3 tablets by mouth daily x 1 day; then 2 tablets by mouth daily x 2 days; then 1 tablet by mouth daily x 3 days; then half tablet by mouth daily x 3 days and stop prednisone. 09/24/23  Yes Vassie Loll, MD  QUEtiapine (SEROQUEL) 50 MG tablet Take 50 mg by mouth at bedtime.  02/26/18  Yes [provider]  acetaminophen (TYLENOL) 325 MG tablet Take 2 tablets (650 mg total) by mouth every 6 (six) hours as needed for mild pain (pain score 1-3) (or Fever >/= 101). 09/24/23   Vassie Loll, MD  dextromethorphan-guaiFENesin [facility] DM) 30-600 MG 12hr tablet Take 1 tablet by mouth 2 (two) times daily as needed for cough. 09/24/23   Vassie Loll, MD      Allergies    Patient has no known allergies.    Review of Systems   Review  of Systems  All other systems reviewed and are negative.   Physical Exam Updated Vital Signs BP 114/62   Pulse (!) 109   Temp 98.6 F (37 C) (Oral)   Resp 20   Ht 5\' 8"  (1.727 m)   Wt 75.9 kg   SpO2 99%   BMI 25.44 kg/m  Physical Exam Vitals and nursing note reviewed.  Constitutional:      Appearance: She is well-developed.  HENT:     Head: Atraumatic.  Eyes:     Extraocular Movements: Extraocular movements intact.  Cardiovascular:     Rate and Rhythm: Tachycardia present. Rhythm irregular.  Pulmonary:     Effort: Pulmonary effort is normal.  Musculoskeletal:        General: No tenderness.     Cervical back: Neck supple.     Right lower leg: No  edema.     Left lower leg: Edema present.  Skin:    General: Skin is warm and dry.  Neurological:     Mental Status: She is alert and oriented to person, place, and time.     ED Results / Procedures / Treatments   Labs (all labs ordered are listed, but only abnormal results are displayed) Labs Reviewed  BASIC METABOLIC PANEL - Abnormal; Notable for the following components:      Result Value   Sodium 134 (*)    Potassium 2.9 (*)    Chloride 95 (*)    Glucose, Bld 170 (*)    Creatinine, Ser 1.24 (*)    Calcium 8.7 (*)    GFR, Estimated 44 (*)    All other components within normal limits  BRAIN NATRIURETIC PEPTIDE - Abnormal; Notable for the following components:   B Natriuretic Peptide 287.0 (*)    All other components within normal limits  D-DIMER, QUANTITATIVE - Abnormal; Notable for the following components:   D-Dimer, Quant 0.98 (*)    All other components within normal limits  URINALYSIS, W/ REFLEX TO CULTURE (INFECTION SUSPECTED) - Abnormal; Notable for the following components:   APPearance HAZY (*)    Hgb urine dipstick SMALL (*)    Protein, ur 30 (*)    Leukocytes,Ua MODERATE (*)    Bacteria, UA MANY (*)    All other components within normal limits  MAGNESIUM - Abnormal; Notable for the following components:   Magnesium 1.3 (*)    All other components within normal limits  BASIC METABOLIC PANEL - Abnormal; Notable for the following components:   Sodium 132 (*)    Glucose, Bld 170 (*)    Creatinine, Ser 1.58 (*)    GFR, Estimated 33 (*)    All other components within normal limits  HEPATIC FUNCTION PANEL - Abnormal; Notable for the following components:   Albumin 2.6 (*)    Total Bilirubin 3.4 (*)    Bilirubin, Direct 1.1 (*)    Indirect Bilirubin 2.3 (*)    All other components within normal limits  HEPATIC FUNCTION PANEL - Abnormal; Notable for the following components:   Total Protein 6.2 (*)    Albumin 2.4 (*)    Total Bilirubin 2.5 (*)    Bilirubin,  Direct 0.8 (*)    Indirect Bilirubin 1.7 (*)    All other components within normal limits  VITAMIN B12 - Abnormal; Notable for the following components:   Vitamin B-12 1,195 (*)    All other components within normal limits  COMPREHENSIVE METABOLIC PANEL - Abnormal; Notable for the following components:  Sodium 132 (*)    Glucose, Bld 187 (*)    BUN 24 (*)    Creatinine, Ser 1.44 (*)    Total Protein 6.1 (*)    Albumin 2.4 (*)    GFR, Estimated 37 (*)    All other components within normal limits  RESP PANEL BY RT-PCR (RSV, FLU A&B, COVID)  RVPGX2  RESPIRATORY PANEL BY PCR  CBC WITH DIFFERENTIAL/PLATELET  PHOSPHORUS  TSH  CBC  PHOSPHORUS  MAGNESIUM  STREP PNEUMONIAE URINARY ANTIGEN  LEGIONELLA PNEUMOPHILA SEROGP 1 UR AG  PROCALCITONIN  TROPONIN I (HIGH SENSITIVITY)  TROPONIN I (HIGH SENSITIVITY)    EKG None  Radiology No results found.  Procedures .Critical Care  Performed by: Derwood Kaplan, MD Authorized by: Derwood Kaplan, MD   Critical care provider statement:    Critical care time (minutes):  36   Critical care was necessary to treat or prevent imminent or life-threatening deterioration of the following conditions:  Respiratory failure   Critical care was time spent personally by me on the following activities:  Development of treatment plan with patient or surrogate, discussions with consultants, evaluation of patient's response to treatment, examination of patient, ordering and review of laboratory studies, ordering and review of radiographic studies, ordering and performing treatments and interventions, pulse oximetry, re-evaluation of patient's condition, review of old charts and obtaining history from patient or surrogate     Medications Ordered in ED Medications  0.9 %  sodium chloride infusion (has no administration in time range)  0.9 %  sodium chloride infusion (0 mLs Intravenous Stopped 09/24/23 0904)  lactated ringers bolus 1,000 mL (0 mLs  Intravenous Stopped 09/21/23 1739)  potassium chloride 10 mEq in 100 mL IVPB (0 mEq Intravenous Stopped 09/21/23 1739)  potassium chloride SA (KLOR-CON M) CR tablet 40 mEq (40 mEq Oral Given 09/21/23 1443)  iohexol (OMNIPAQUE) 350 MG/ML injection 60 mL (60 mLs Intravenous Contrast Given 09/21/23 1509)  potassium chloride SA (KLOR-CON M) CR tablet 40 mEq (40 mEq Oral Given 09/21/23 2108)  magnesium sulfate IVPB 2 g 50 mL (0 g Intravenous Stopped 09/21/23 1948)  methylPREDNISolone sodium succinate (SOLU-MEDROL) 40 mg/mL injection 40 mg (40 mg Intravenous Given 09/21/23 1802)    ED Course/ Medical Decision Making/ A&P Clinical Course as of 09/25/23 0909  Mon Sep 21, 2023  1429 D-dimer, quantitative(!) CT PE has been ordered. [AN]  1429 Potassium(!): 2.9 K is 2.9.  We will give her oral potassium, IV potassium and also IV magnesium.  [AN]  1430 Creatinine(!): 1.24 At baseline for the patient. [AN]    Clinical Course User Index [AN] Derwood Kaplan, MD                                 Medical Decision Making Amount and/or Complexity of Data Reviewed Labs: ordered. Decision-making details documented in ED Course. Radiology: ordered.  Risk Prescription drug management. Decision regarding hospitalization.  This patient presents to the ED with chief complaint(s) of weakness, reduced p.o. intake with pertinent past medical history of A-fib, pacemaker placement.The complaint involves an extensive differential diagnosis and also carries with it a high risk of complications and morbidity.    The differential diagnosis includes : Pulmonary edema, PE, symptomatic A-fib, CHF exacerbation, electrolyte abnormality, new renal failure, medication side effects, flu and pneumonia.  The initial plan is to get basic labs, including troponin, BNP and D-dimer. Patient has new oxygen requirement.  Upon turning off  her O2, O2 sats dropped to 87%.   Additional history obtained: Additional history obtained  from family Records reviewed previous admission documents  Independent labs interpretation:  The following labs were independently interpreted: see workup tab  Independent visualization and interpretation of imaging: - I independently visualized the following imaging with scope of interpretation limited to determining acute life threatening conditions related to emergency care: chest xray, which revealed no large consolidation or effusion.  Treatment and Reassessment: CXR reassuring. Labs overall reassuring. CT PE ordered, will seek admission. Medicine team will follow up on CT.  Acute hypoxia, new 4 L O2 requirement - admit.    Final Clinical Impression(s) / ED Diagnoses Final diagnoses:  Acute hypoxic respiratory failure (HCC)    Rx / DC Orders ED Discharge Orders          Ordered    acetaminophen (TYLENOL) 325 MG tablet  Every 6 hours PRN        09/24/23 1537    predniSONE (DELTASONE) 20 MG tablet        09/24/23 1537    dextromethorphan-guaiFENesin (MUCINEX DM) 30-600 MG 12hr tablet  2 times daily PRN        09/24/23 1537    pantoprazole (PROTONIX) 40 MG tablet  Daily        09/24/23 1537    Increase activity slowly        09/24/23 1537    Diet - low sodium heart healthy        09/24/23 1537    Discharge instructions       Comments: Physical therapy and conditioning as per the skilled nursing facility protocol Continue to wean oxygen supplementation as tolerated Maintain adequate hydration, follow low-sodium diet and check weight on daily basis. Outpatient follow-up with PCP in 2 weeks after discharge from the skilled nursing facility.   09/24/23 1537              Derwood Kaplan, MD 09/25/23 [phone]

## 2023-09-21 NOTE — H&P (Signed)
 [facility] History and Physical   Patient: Lauren Baker OZH:[id number]   PCP: Kathyleen Parkins, MD DOB: 1940-09-03   DOA: 09/21/2023   DOS: 09/21/2023   DOS: the patient was seen and examined on 09/21/2023  Patient coming from: The patient is coming from Home  Chief Complaint: weakness, decreased PO intake  HPI: Lauren Baker is a 83 y.o. 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 intake for 2 to 3 weeks, gradually getting worse and not back to her baseline.  EMS was called and patient was found to have hypoxic respiratory failure 88% on room air.  Patient was placed on 4 L oxygen and was brought into the ED.   ED Course: Afebrile, HR 111 A-fib with RVR, RR 24, BP 118/90, 88% on room air BMP Na 134, K2.9, glucose 170, creatinine 1.24  D-dimer 0.98, CTA chest is pending, less likely PE in view of anticoagulation, patient is on Eliquis  at troponin x 2 negative CBC within normal range Negative COVID, RSV and flu RVP pending CXR: Chronic appearing interstitial changes. No acute cardiopulmonary findings. Mild cardiomegaly. CTA chest: Negative for PE, multiple bilateral groundglass opacities, most likely multifocal atypical pneumonia such as viral etiology?   Review of Systems: as mentioned in the history of present illness.  All other systems reviewed and are negative.  Past Medical History:  Diagnosis Date   Chronic atrial fibrillation (HCC)    Hypertension    Past Surgical History:  Procedure Laterality Date   detatched retina     PACEMAKER IMPLANT N/A 10/25/2018   Procedure: PACEMAKER IMPLANT;  Surgeon: Jolly Needle, MD;  Location: [facility] INVASIVE CV LAB;  Service: Cardiovascular;  Laterality: N/A;   Social History:  reports that she has never smoked. She has never used smokeless tobacco. She reports  that she does not drink alcohol  and does not use drugs.  No Known Allergies   Family history reviewed and not pertinent Family History  Problem Relation Age of Onset   CVA Mother    Hypertension Sister    Heart attack Brother    Heart attack Sister    Cancer Sister      Prior to Admission medications   Medication Sig Start Date End Date Taking? Authorizing Provider  apixaban  (ELIQUIS ) 5 MG TABS tablet TAKE 1 TABLET BY MOUTH TWICE DAILY. 07/03/21  Yes BranchJoyceann No, MD  buPROPion  (WELLBUTRIN ) 100 MG tablet Take 100 mg by mouth 3 (three) times daily. 02/26/18  Yes [provider]  cholecalciferol  (VITAMIN D ) 1000 units tablet Take 1,000 Units by mouth daily.   Yes [provider]  clonazePAM  (KLONOPIN ) 0.5 MG tablet Take 0.5 mg by mouth 2 (two) times daily. 01/19/18  Yes [provider]  diltiazem  (CARDIZEM  CD) 300 MG 24 hr capsule Take 1 capsule (300 mg total) by mouth daily. 09/29/22  Yes BranchJoyceann No, MD  DULoxetine  (CYMBALTA ) 30 MG capsule Take 30 mg by mouth daily.  12/13/17  Yes [provider]  levothyroxine  (SYNTHROID ) 25 MCG tablet Take 25 mcg by mouth daily. 11/27/21  Yes [provider]  losartan  (COZAAR ) 50 MG tablet Take 1 tablet (50 mg total) by mouth daily. 09/29/22  Yes BranchJoyceann No, MD  metoprolol  (TOPROL -XL) 200 MG 24 hr tablet Take 1 tablet (200 mg total) by mouth daily. 09/29/22  Yes Branch, Joyceann No, MD  QUEtiapine  (SEROQUEL ) 50 MG tablet Take 50 mg by mouth at bedtime.  02/26/18  Yes [provider]    Physical Exam: Vitals:   09/21/23 1119 09/21/23 1120 09/21/23 1200 09/21/23 1206  BP:   115/86   Pulse:   (!) 105   Resp:   (!) 24   Temp:    97.8 F (36.6 C)  TempSrc:    Oral  SpO2: (!) 88% 96% 96%   Weight:  81.6 kg    Height:  5\' 8"  (1.727 m)      General: alert and oriented to time, place, and person. Appear in mild distress, affect appropriate Eyes: PERRLA, Conjunctiva normal ENT:  Oral Mucosa Clear, moist  Neck: no JVD, no Abnormal Mass Or lumps Cardiovascular: S1 and S2 Present, no Murmur, peripheral pulses symmetrical Respiratory: good respiratory effort, Bilateral Air entry equal and Decreased, no signs of accessory muscle use, Clear to Auscultation, no Crackles, no wheezes Abdomen: Bowel Sound present, Soft and no tenderness, no hernia Skin: no rashes  Extremities: no Pedal edema, no calf tenderness Neurologic: without any new focal findings Gait not checked due to patient safety concerns  Data Reviewed: I have personally reviewed and interpreted labs, imaging as discussed below.  CBC: Recent Labs  Lab 09/21/23 1244  WBC 6.9  NEUTROABS 5.1  HGB 12.8  HCT 37.5  MCV 91.0  PLT 272   Basic Metabolic Panel: Recent Labs  Lab 09/21/23 1244  NA 134*  K 2.9*  CL 95*  CO2 28  GLUCOSE 170*  BUN 16  CREATININE 1.24*  CALCIUM  8.7*   GFR: Estimated Creatinine Clearance: 39.9 mL/min (A) (by C-G formula based on SCr of 1.24 mg/dL (H)). Liver Function Tests: No results for input(s): "AST", "ALT", "ALKPHOS", "BILITOT", "PROT", "ALBUMIN " in the last 168 hours. No results for input(s): "LIPASE", "AMYLASE" in the last 168 hours. No results for input(s): "AMMONIA" in the last 168 hours. Coagulation Profile: No results for input(s): "INR", "PROTIME" in the last 168 hours. Cardiac Enzymes: No results for input(s): "CKTOTAL", "CKMB", "CKMBINDEX", "TROPONINI" in the last 168 hours. BNP (last 3 results) No results for input(s): "PROBNP" in the last 8760 hours. HbA1C: No results for input(s): "HGBA1C" in the last 72 hours. CBG: No results for input(s): "GLUCAP" in the last 168 hours. Lipid Profile: No results for input(s): "CHOL", "HDL", "LDLCALC", "TRIG", "CHOLHDL", "LDLDIRECT" in the last 72 hours. Thyroid  Function Tests: No results for input(s): "TSH", "T4TOTAL", "FREET4", "T3FREE", "THYROIDAB" in the last 72 hours. Anemia Panel: No results for input(s):  "VITAMINB12", "FOLATE", "FERRITIN", "TIBC", "IRON", "RETICCTPCT" in the last 72 hours. Urine analysis:    Component Value Date/Time   COLORURINE YELLOW 10/20/2018 2010   APPEARANCEUR HAZY (A) 10/20/2018 2010   LABSPEC 1.013 10/20/2018 2010   PHURINE 5.0 10/20/2018 2010   GLUCOSEU 50 (A) 10/20/2018 2010   HGBUR SMALL (A) 10/20/2018 2010   BILIRUBINUR NEGATIVE 10/20/2018 2010   KETONESUR NEGATIVE 10/20/2018 2010   PROTEINUR NEGATIVE 10/20/2018 2010   NITRITE POSITIVE (A) 10/20/2018 2010   LEUKOCYTESUR LARGE (A) 10/20/2018 2010    Radiological Exams on Admission: DG Chest Port 1 View Result Date: 09/21/2023 CLINICAL DATA:  Shortness of breath. EXAM: PORTABLE CHEST 1 VIEW COMPARISON:  Chest radiograph dated October 26, 2018. FINDINGS: The heart size is mildly enlarged. The mediastinal contours are within normal limits. Stable left chest wall AICD in place. Diffuse bilateral chronic appearing interstitial markings. Mild left basilar atelectasis/scarring. No focal consolidation, sizeable pleural effusion, or pneumothorax. No  acute osseous abnormality. IMPRESSION: 1. Chronic appearing interstitial changes. No acute cardiopulmonary findings. 2. Mild cardiomegaly. Electronically Signed   By: Mannie Seek M.D.   On: 09/21/2023 13:54   EKG: Independently reviewed.  Ventricular paced rhythm Echocardiogram: Order placed, pending  I reviewed all nursing notes, pharmacy notes, vitals, pertinent old records.  Assessment/Plan Principal Problem:   Respiratory failure with hypoxia (HCC)   # Respiratory failure with hypoxia, multifocal pneumonia, atypical, possible viral infection CXR: Chronic interstitial changes, cardiomegaly. Started empiric antibiotics, azithromycin  500 mg IV daily for 5 days, may transition to oral after improvement Solu-Medrol  40 mg IV x 1 dose followed by prednisone  40 mg p.o. daily for 4 days Continue symptomatic treatment with DuoNeb as needed,  Mucinex  600 twice  daily Continue incentive spirometry Continue supplemental O2 inhalation and gradually wean off Follow CTA chest F/u RVP, rule out other viral cause Follow urine strep and Legionella antigen  # Hypokalemia, potassium repleted. # Hypomagnesemia, mag repleted. # Hyponatremia, mild, monitor BMP daily Monitor electrolytes and replete as needed.  # Chronic A-fib with RVR, s/p PPM, ventricular paced rhythm # Hypertension Resumed Eliquis  5 mg p.o. twice daily, Cardizem  CD 3 mg p.o. daily, Toprol -XL 200 mg p.o. daily, losartan  50 mg p.o. daily Monitor BP and titrate medication accordingly Continue to monitor on telemetry  # Gallstone, incidental finding Patient denies any abdominal pain, asymptomatic CT scan: Noncalcified gallstones, with possible impacted stone at the gallbladder neck. High attenuation material filling the gallbladder lumen may reflect vicarious excretion of previous contrast or sludge. No CT evidence of acute cholecystitis. If gallbladder pathology is suspected, right upper quadrant ultrasound may be useful. Check LFTs Follow US  RUQ  # Hypothyroid, continued Synthroid . # Depression: Resumed Klonopin , Wellbutrin , Cymbalta  and Seroquel  Home meds Continue supportive care   Nutrition: Regular diet DVT Prophylaxis: Therapeutic Anticoagulation with Eliquis   Advance goals of care discussion: Full code   Consults: None, amy consult cards in am if remain in A.fib RVR   Family Communication: family was present at bedside, at the time of interview.  Opportunity was given to ask question and all questions were answered satisfactorily.  Disposition: Admitted as inpatient, telemetry unit. Likely to be discharged home, in 2-3 days when stable.  I have discussed plan of care as described above with RN and patient/family.  Severity of Illness: The appropriate patient status for this patient is INPATIENT. Inpatient status is judged to be reasonable and necessary in order to  provide the required intensity of service to ensure the patient's safety. The patient's presenting symptoms, physical exam findings, and initial radiographic and laboratory data in the context of their chronic comorbidities is felt to place them at high risk for further clinical deterioration. Furthermore, it is not anticipated that the patient will be medically stable for discharge from the hospital within 2 midnights of admission.   * I certify that at the point of admission it is my clinical judgment that the patient will require inpatient hospital care spanning beyond 2 midnights from the point of admission due to high intensity of service, high risk for further deterioration and high frequency of surveillance required.*   Author: Althia Atlas, MD [facility] 09/21/2023 3:31 PM   To reach On-call, see care teams to locate the attending and reach out to them via [email or web address]. If 7PM-7AM, please contact night-coverage If you still have difficulty reaching the attending provider, please page the [facility] (Director on Call) for [facility] on amion for assistance.

## 2023-09-21 NOTE — ED Triage Notes (Signed)
 Pt brought in by [facility]. Pt family sent pt for weakness and nause x1 week. Denies any diarrhea/ vomiting. Pt has a pace maker but HR in the 130s. Hx of afib. Pt sats 88% on RA. Pt placed  on 4L, sats now 96%on 4L.

## 2023-09-22 ENCOUNTER — Inpatient Hospital Stay (HOSPITAL_COMMUNITY): Payer: Medicare HMO

## 2023-09-22 DIAGNOSIS — Z950 Presence of cardiac pacemaker: Secondary | ICD-10-CM | POA: Diagnosis not present

## 2023-09-22 DIAGNOSIS — I482 Chronic atrial fibrillation, unspecified: Secondary | ICD-10-CM | POA: Diagnosis not present

## 2023-09-22 DIAGNOSIS — J9601 Acute respiratory failure with hypoxia: Secondary | ICD-10-CM | POA: Diagnosis not present

## 2023-09-22 DIAGNOSIS — I4891 Unspecified atrial fibrillation: Secondary | ICD-10-CM | POA: Diagnosis not present

## 2023-09-22 LAB — CBC
HCT: 36.7 % (ref 36.0–46.0)
Hemoglobin: 12.3 g/dL (ref 12.0–15.0)
MCH: 31 pg (ref 26.0–34.0)
MCHC: 33.5 g/dL (ref 30.0–36.0)
MCV: 92.4 fL (ref 80.0–100.0)
Platelets: 230 10*3/uL (ref 150–400)
RBC: 3.97 MIL/uL (ref 3.87–5.11)
RDW: 14.5 % (ref 11.5–15.5)
WBC: 4.6 10*3/uL (ref 4.0–10.5)
nRBC: 0 % (ref 0.0–0.2)

## 2023-09-22 LAB — BASIC METABOLIC PANEL
Anion gap: 9 (ref 5–15)
BUN: 19 mg/dL (ref 8–23)
CO2: 25 mmol/L (ref 22–32)
Calcium: 8.9 mg/dL (ref 8.9–10.3)
Chloride: 98 mmol/L (ref 98–111)
Creatinine, Ser: 1.58 mg/dL — ABNORMAL HIGH (ref 0.44–1.00)
GFR, Estimated: 33 mL/min — ABNORMAL LOW (ref >60)
Glucose, Bld: 170 mg/dL — ABNORMAL HIGH (ref 70–99)
Potassium: 4 mmol/L (ref 3.5–5.1)
Sodium: 132 mmol/L — ABNORMAL LOW (ref 135–145)

## 2023-09-22 LAB — ECHOCARDIOGRAM COMPLETE
AR max vel: 3.01 cm2
AV Area VTI: 3.63 cm2
AV Area mean vel: 3.3 cm2
AV Mean grad: 2 mm[Hg]
AV Peak grad: 3.9 mm[Hg]
Ao pk vel: 0.99 m/s
Height: 68 in
MV VTI: 2.9 cm2
S' Lateral: 2.1 cm
Weight: 2677.27 [oz_av]

## 2023-09-22 LAB — MAGNESIUM: Magnesium: 2 mg/dL (ref 1.7–2.4)

## 2023-09-22 LAB — HEPATIC FUNCTION PANEL
ALT: 18 U/L (ref 0–44)
AST: 22 U/L (ref 15–41)
Albumin: 2.4 g/dL — ABNORMAL LOW (ref 3.5–5.0)
Alkaline Phosphatase: 89 U/L (ref 38–126)
Bilirubin, Direct: 0.8 mg/dL — ABNORMAL HIGH (ref 0.0–0.2)
Indirect Bilirubin: 1.7 mg/dL — ABNORMAL HIGH (ref 0.3–0.9)
Total Bilirubin: 2.5 mg/dL — ABNORMAL HIGH (ref 0.0–1.2)
Total Protein: 6.2 g/dL — ABNORMAL LOW (ref 6.5–8.1)

## 2023-09-22 LAB — PHOSPHORUS: Phosphorus: 3.2 mg/dL (ref 2.5–4.6)

## 2023-09-22 LAB — PROCALCITONIN: Procalcitonin: <0.1 ng/mL

## 2023-09-22 MED ORDER — APIXABAN 2.5 MG PO TABS
2.5000 mg | ORAL_TABLET | Freq: Two times a day (BID) | ORAL | Status: DC
  Administered 2023-09-22 – 2023-09-23 (×2): 2.5 mg via ORAL
  Filled 2023-09-22 (×2): qty 1

## 2023-09-22 MED ORDER — METHYLPREDNISOLONE SODIUM SUCC 40 MG IJ SOLR
40.0000 mg | Freq: Two times a day (BID) | INTRAMUSCULAR | Status: DC
  Administered 2023-09-22 – 2023-09-24 (×5): 40 mg via INTRAVENOUS
  Filled 2023-09-22 (×5): qty 1

## 2023-09-22 MED ORDER — IPRATROPIUM-ALBUTEROL 0.5-2.5 (3) MG/3ML IN SOLN
3.0000 mL | Freq: Three times a day (TID) | RESPIRATORY_TRACT | Status: DC
  Administered 2023-09-22 – 2023-09-24 (×6): 3 mL via RESPIRATORY_TRACT
  Filled 2023-09-22 (×6): qty 3

## 2023-09-22 MED ORDER — SODIUM CHLORIDE 0.9 % IV SOLN
2.0000 g | INTRAVENOUS | Status: DC
  Administered 2023-09-22: 2 g via INTRAVENOUS
  Filled 2023-09-22: qty 20

## 2023-09-22 MED ORDER — SODIUM CHLORIDE 0.9 % IV SOLN: INTRAVENOUS | Status: AC

## 2023-09-22 MED ORDER — DM-GUAIFENESIN ER 30-600 MG PO TB12
1.0000 | ORAL_TABLET | Freq: Two times a day (BID) | ORAL | Status: DC
  Administered 2023-09-22 – 2023-09-24 (×5): 1 via ORAL
  Filled 2023-09-22 (×5): qty 1

## 2023-09-22 MED ORDER — IPRATROPIUM-ALBUTEROL 0.5-2.5 (3) MG/3ML IN SOLN
3.0000 mL | Freq: Four times a day (QID) | RESPIRATORY_TRACT | Status: DC
  Administered 2023-09-22 (×2): 3 mL via RESPIRATORY_TRACT
  Filled 2023-09-22 (×2): qty 3

## 2023-09-22 MED ORDER — ORAL CARE MOUTH RINSE: 15.0000 mL | OROMUCOSAL | Status: DC | PRN

## 2023-09-22 MED ORDER — ALBUTEROL SULFATE (2.5 MG/3ML) 0.083% IN NEBU: 2.5000 mg | INHALATION_SOLUTION | RESPIRATORY_TRACT | Status: DC | PRN

## 2023-09-22 NOTE — TOC CM/SW Note (Signed)
Transition of Care [facility]) - Inpatient Brief Assessment   Patient Details  Name: Lauren Baker MRN: [id number] Date of Birth: 1941-08-07  [facility] of [facility] CM/SW Contact:    Villa Herb, LCSWA Phone Number: 09/22/2023, 11:20 AM   Clinical Narrative: Transition of Care Department [facility]) has reviewed patient and no TOC needs have been identified at this time. We will continue to monitor patient advancement through interdiciplinary progression rounds. If new patient transition needs arise, please place a TOC consult.   Transition of Care Asessment: Insurance and Status: Insurance coverage has been reviewed Patient has primary care physician: Yes Home environment has been reviewed: From home Prior level of function:: Independent Prior/Current Home Services: No current home services Social Drivers of Health Review: SDOH reviewed no interventions necessary Readmission risk has been reviewed: Yes Transition of care needs: no transition of care needs at this time

## 2023-09-22 NOTE — ED Notes (Signed)
[place] at bedside

## 2023-09-22 NOTE — Progress Notes (Addendum)
PROGRESS NOTE  Lauren Baker, is a 83 y.o. female, DOB - [birth year], EAV:[id number]  Admit date - 09/21/2023   Admitting Physician Lauren Santa, MD  Outpatient Primary MD for the patient is Lauren Nevins, MD  LOS - 1  Chief Complaint  Patient presents with   Weakness      Brief Narrative:   83 y.o. 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    -Assessment and Plan: 1)Acute Respiratory failure with hypoxia- multifocal pneumonia, atypical, possible viral infection -CTA chest w/o Pulm Embolism  -Multifocal bilateral ground-glass airspace disease superimposed upon chronic interstitial scarring. Favor multifocal atypical pneumonia such as viral etiology, though atypical edema or inflammatory alveolitis could be considered in the appropriate setting. -PCT < 0.10 WBC 6.9 >>4.6 Strep Pneumo Neg Covid, Flu and RSV Neg, 20 path Viral Resp Panel neg C/n iv Solu-Medrol   -Okay to discontinue Rocephin and azithromycin on 09/22/2023 Continue symptomatic treatment with DuoNeb as needed, Mucinex   Continue incentive spirometry Unable to wean off oxygen ---currently on 2L/min oxygen   # Hypokalemia- Normalized after replacement # Hypomagnesemia-- -Normalized after replacement # Hyponatremia, mild,  Monitor electrolytes and replete as needed.   # Chronic A-fib with RVR, s/p PPM, ventricular paced rhythm -Echocardiogram from 09/22/2023 with preserved EF of 55 to 60%, no aortic stenosis, no mitral stenosis, left and right atrium severely dilated, no significant change from prior echo from March 2020 -Resumed Eliquis for stroke prevention,  -c/n Cardizem CD and Toprol-XL for rate control  # Hypertension-c/n losartan 50 mg p.o. daily -Cardizem and metoprolol as above Monitor BP and titrate medication accordingly Continue to monitor on telemetry   # Gallstones-incidental finding on CT  abdomen right upper quadrant ultrasound with gallstones without acute cholecystitis -Clinically Murphy sign is negative, no leukocytosis no emesis, no abdominal pain Patient denies any abdominal pain, asymptomatic -LFTs noted  # Hypothyroid, continued Synthroid.  # Depression: Resumed Klonopin, Wellbutrin, Cymbalta and Seroquel  Continue supportive care   -Generalized weakness and deconditioning--- physical therapy eval requested  Status is: Inpatient   Disposition: The patient is from: Home              Anticipated d/c is to: Home              Anticipated d/c date is: 1 day              Patient currently is not medically stable to d/c. Barriers: Not Clinically Stable-   Code Status :  -  Code Status: Full Code   Family Communication:    (patient is alert, awake and coherent)  Discussed with patient's daughter Lauren Baker  DVT Prophylaxis  :   - SCDs  apixaban (ELIQUIS) tablet 2.5 mg Start: 09/22/23 2200 apixaban (ELIQUIS) tablet 2.5 mg   Lab Results  Component Value Date   PLT 230 09/22/2023   Inpatient Medications  Scheduled Meds:  apixaban  2.5 mg Oral BID   bisacodyl  10 mg Oral QHS   buPROPion  100 mg Oral Once   buPROPion  100 mg Oral TID   clonazePAM  0.5 mg Oral BID   dextromethorphan-guaiFENesin  1 tablet Oral BID   diltiazem  300 mg Oral Daily   DULoxetine  30 mg Oral Daily   ipratropium-albuterol  3 mL Nebulization TID   levothyroxine  25 mcg Oral Daily   losartan  50 mg Oral Daily   methylPREDNISolone (SOLU-MEDROL) injection  40 mg Intravenous Q12H   metoprolol  200 mg Oral Daily   polyethylene glycol  17 g Oral BID   QUEtiapine  50 mg Oral QHS   sodium chloride flush  3 mL Intravenous Q12H   sodium chloride flush  3 mL Intravenous Q12H   Continuous Infusions:  sodium chloride 83 mL/hr at 09/22/23 1628   azithromycin Stopped (09/21/23 2057)   cefTRIAXone (ROCEPHIN)  IV Stopped (09/22/23 1049)   PRN Meds:.acetaminophen **OR** acetaminophen, albuterol,  bisacodyl, ondansetron **OR** ondansetron (ZOFRAN) IV, mouth rinse, sodium chloride flush   Anti-infectives (From admission, onward)    Start     Dose/Rate Route Frequency Ordered Stop   09/22/23 1000  cefTRIAXone (ROCEPHIN) 2 g in sodium chloride 0.9 % 100 mL IVPB        2 g 200 mL/hr over 30 Minutes Intravenous Every 24 hours 09/22/23 0801     09/21/23 1800  azithromycin (ZITHROMAX) 500 mg in sodium chloride 0.9 % 250 mL IVPB        500 mg 250 mL/hr over 60 Minutes Intravenous Every 24 hours 09/21/23 1739         Subjective: Lauren Baker today has no fevers, no emesis,  No chest pain,    -Poor appetite and generalized weakness persist -Daughter Lauren Baker at bedside, questions answered  Objective: Vitals:   09/22/23 0645 09/22/23 0908 09/22/23 1300 09/22/23 1335  BP: 108/76 122/83  117/75  Pulse: (!) 111 92  (!) 109  Resp:  18  17  Temp:  (!) 97.4 F (36.3 C)  98.1 F (36.7 C)  TempSrc:  Axillary    SpO2: 97% 93% 95% 93%  Weight:  75.9 kg    Height:  5\' 8"  (1.727 m)      Intake/Output Summary (Last 24 hours) at 09/22/2023 1729 Last data filed at 09/22/2023 1628 Gross per 24 hour  Intake 921.14 ml  Output --  Net 921.14 ml   Filed Weights   09/21/23 1120 09/22/23 0908  Weight: 81.6 kg 75.9 kg    Physical Exam  Gen:- Awake Alert,  in no apparent distress  HEENT:- [place].AT, No sclera icterus Nose- [place] 2L/min Neck-Supple Neck,No JVD,.  Lungs-  No wheezing , fair symmetrical air movement CV- S1, S2 normal, irregular, Pacemaker in situ Abd-  +ve B.Sounds, Abd Soft, No tenderness, increased truncal adiposity  Extremity/Skin:- No  edema, pedal pulses present  Psych-affect is appropriate, oriented x3 Neuro-generalized weakness, no new focal deficits, no tremors  Data Reviewed: I have personally reviewed following labs and imaging studies  CBC: Recent Labs  Lab 09/21/23 1244 09/22/23 0509  WBC 6.9 4.6  NEUTROABS 5.1  --   HGB 12.8 12.3  HCT 37.5 36.7  MCV 91.0  92.4  PLT 272 230   Basic Metabolic Panel: Recent Labs  Lab 09/21/23 1244 09/21/23 1438 09/22/23 0509  NA 134*  --  132*  K 2.9*  --  4.0  CL 95*  --  98  CO2 28  --  25  GLUCOSE 170*  --  170*  BUN 16  --  19  CREATININE 1.24*  --  1.58*  CALCIUM 8.7*  --  8.9  MG  --  1.3* 2.0  PHOS  --  2.5 3.2   GFR: Estimated Creatinine Clearance: 28.2 mL/min (A) (by C-G formula based on SCr of 1.58 mg/dL (H)). Liver Function Tests: Recent Labs  Lab 09/21/23 1438 09/22/23 0509  AST 27 22  ALT 20 18  ALKPHOS 90  89  BILITOT 3.4* 2.5*  PROT 6.6 6.2*  ALBUMIN 2.6* 2.4*   Recent Results (from the past 240 hours)  Resp panel by RT-PCR (RSV, Flu A&B, Covid) Anterior Nasal Swab     Status: None   Collection Time: 09/21/23 12:05 PM   Specimen: Anterior Nasal Swab  Result Value Ref Range Status   SARS Coronavirus 2 by RT PCR NEGATIVE NEGATIVE Final    Comment: (NOTE) SARS-CoV-2 target nucleic acids are NOT DETECTED.  The SARS-CoV-2 RNA is generally detectable in upper respiratory specimens during the acute phase of infection. The lowest concentration of SARS-CoV-2 viral copies this assay can detect is 138 copies/mL. A negative result does not preclude SARS-Cov-2 infection and should not be used as the sole basis for treatment or other patient management decisions. A negative result may occur with  improper specimen collection/handling, submission of specimen other than nasopharyngeal swab, presence of viral mutation(s) within the areas targeted by this assay, and inadequate number of viral copies(<138 copies/mL). A negative result must be combined with clinical observations, patient history, and epidemiological information. The expected result is Negative.  Fact Sheet for Patients:  BloggerCourse.com  Fact Sheet for Healthcare Providers:  SeriousBroker.it  This test is no t yet approved or cleared by the [place] FDA and   has been authorized for detection and/or diagnosis of SARS-CoV-2 by FDA under an Emergency Use Authorization (EUA). This EUA will remain  in effect (meaning this test can be used) for the duration of the COVID-19 declaration under Section 564(b)(1) of the Act, 21 U.S.C.section 360bbb-3(b)(1), unless the authorization is terminated  or revoked sooner.       Influenza A by PCR NEGATIVE NEGATIVE Final   Influenza B by PCR NEGATIVE NEGATIVE Final    Comment: (NOTE) The Xpert Xpress SARS-CoV-2/FLU/RSV plus assay is intended as an aid in the diagnosis of influenza from Nasopharyngeal swab specimens and should not be used as a sole basis for treatment. Nasal washings and aspirates are unacceptable for Xpert Xpress SARS-CoV-2/FLU/RSV testing.  Fact Sheet for Patients: BloggerCourse.com  Fact Sheet for Healthcare Providers: SeriousBroker.it  This test is not yet approved or cleared by the [place] FDA and has been authorized for detection and/or diagnosis of SARS-CoV-2 by FDA under an Emergency Use Authorization (EUA). This EUA will remain in effect (meaning this test can be used) for the duration of the COVID-19 declaration under Section 564(b)(1) of the Act, 21 U.S.C. section 360bbb-3(b)(1), unless the authorization is terminated or revoked.     Resp Syncytial Virus by PCR NEGATIVE NEGATIVE Final    Comment: (NOTE) Fact Sheet for Patients: BloggerCourse.com  Fact Sheet for Healthcare Providers: SeriousBroker.it  This test is not yet approved or cleared by the [place] FDA and has been authorized for detection and/or diagnosis of SARS-CoV-2 by FDA under an Emergency Use Authorization (EUA). This EUA will remain in effect (meaning this test can be used) for the duration of the COVID-19 declaration under Section 564(b)(1) of the Act, 21 U.S.C. section 360bbb-3(b)(1),  unless the authorization is terminated or revoked.  Performed at [facility], [address] [place], [place] [address]   Respiratory (~20 pathogens) panel by PCR     Status: None   Collection Time: 09/21/23  6:20 PM   Specimen: Nasopharyngeal Swab; Respiratory  Result Value Ref Range Status   Adenovirus NOT DETECTED NOT DETECTED Final   Coronavirus 229E NOT DETECTED NOT DETECTED Final    Comment: (NOTE) The Coronavirus on the Respiratory  Panel, DOES NOT test for the novel  Coronavirus (2019 nCoV)    Coronavirus HKU1 NOT DETECTED NOT DETECTED Final   Coronavirus NL63 NOT DETECTED NOT DETECTED Final   Coronavirus OC43 NOT DETECTED NOT DETECTED Final   Metapneumovirus NOT DETECTED NOT DETECTED Final   Rhinovirus / Enterovirus NOT DETECTED NOT DETECTED Final   Influenza A NOT DETECTED NOT DETECTED Final   Influenza B NOT DETECTED NOT DETECTED Final   Parainfluenza Virus 1 NOT DETECTED NOT DETECTED Final   Parainfluenza Virus 2 NOT DETECTED NOT DETECTED Final   Parainfluenza Virus 3 NOT DETECTED NOT DETECTED Final   Parainfluenza Virus 4 NOT DETECTED NOT DETECTED Final   Respiratory Syncytial Virus NOT DETECTED NOT DETECTED Final   Bordetella pertussis NOT DETECTED NOT DETECTED Final   Bordetella Parapertussis NOT DETECTED NOT DETECTED Final   Chlamydophila pneumoniae NOT DETECTED NOT DETECTED Final   Mycoplasma pneumoniae NOT DETECTED NOT DETECTED Final    Comment: Performed at [facility], [address] [place], [place] [address]    Radiology Studies: ECHOCARDIOGRAM COMPLETE Result Date: 09/22/2023    ECHOCARDIOGRAM REPORT   Patient Name:   RAMINA HULET Date of Exam: 09/22/2023 Medical Rec #:  [id number]         Height:       68.0 in Accession #:    [id number]        Weight:       167.3 lb Date of Birth:  [birth year]         BSA:          1.895 m Patient Age:    82 years          BP:           108/76 mmHg Patient Gender: F                 HR:           99 bpm. Exam  Location:  [facility] Procedure: 2D Echo, Cardiac Doppler and Color Doppler Indications:    A Fib  History:        Patient has prior history of Echocardiogram examinations, most                 recent 10/19/2018. Arrythmias:Atrial Fibrillation; Risk                 Factors:Hypertension.  Sonographer:    Amy Chionchio Referring Phys: [id number] Lauren Baker IMPRESSIONS  1. Left ventricular ejection fraction, by estimation, is 55 to 60%. The left ventricle has normal function. Left ventricular endocardial border not optimally defined to evaluate regional wall motion. Indeterminate diastolic filling due to E-A fusion.  2. Right ventricular systolic function is normal. The right ventricular size is mildly enlarged.  3. Left atrial size was severely dilated.  4. Right atrial size was severely dilated.  5. The mitral valve is normal in structure. Mild mitral valve regurgitation. No evidence of mitral stenosis.  6. The aortic valve is tricuspid. Aortic valve regurgitation is trivial. No aortic stenosis is present. Comparison(s): No significant change from prior study. FINDINGS  Left Ventricle: Left ventricular ejection fraction, by estimation, is 55 to 60%. The left ventricle has normal function. Left ventricular endocardial border not optimally defined to evaluate regional wall motion. The left ventricular internal cavity size was normal in size. There is no left ventricular hypertrophy. Left ventricular diastolic function could not be evaluated due to atrial fibrillation. Indeterminate diastolic filling due to E-A  fusion. Right Ventricle: The right ventricular size is mildly enlarged. No increase in right ventricular wall thickness. Right ventricular systolic function is normal. Left Atrium: Left atrial size was severely dilated. Right Atrium: Right atrial size was severely dilated. Prominent Eustachian valve. Pericardium: Trivial pericardial effusion is present. Mitral Valve: The mitral valve is normal in structure. Mild  mitral valve regurgitation. No evidence of mitral valve stenosis. MV peak gradient, 5.7 mmHg. The mean mitral valve gradient is 2.0 mmHg. Tricuspid Valve: The tricuspid valve is normal in structure. Tricuspid valve regurgitation is mild . No evidence of tricuspid stenosis. Aortic Valve: The aortic valve is tricuspid. Aortic valve regurgitation is trivial. No aortic stenosis is present. Aortic valve mean gradient measures 2.0 mmHg. Aortic valve peak gradient measures 3.9 mmHg. Aortic valve area, by VTI measures 3.63 cm. Pulmonic Valve: The pulmonic valve was normal in structure. Pulmonic valve regurgitation is mild. No evidence of pulmonic stenosis. Aorta: The aortic root and ascending aorta are structurally normal, with no evidence of dilitation. Venous: The inferior vena cava was not well visualized. IAS/Shunts: The interatrial septum was not assessed.  LEFT VENTRICLE PLAX 2D LVIDd:         3.20 cm LVIDs:         2.10 cm LV PW:         1.00 cm LV IVS:        1.00 cm LVOT diam:     2.00 cm LV SV:         54 LV SV Index:   29 LVOT Area:     3.14 cm  RIGHT VENTRICLE RV Basal diam:  4.70 cm RV Mid diam:    3.50 cm TAPSE (M-mode): 1.5 cm LEFT ATRIUM              Index        RIGHT ATRIUM           Index LA Vol (A2C):   106.0 ml 55.95 ml/m  RA Area:     31.10 cm LA Vol (A4C):   137.0 ml 72.31 ml/m  RA Volume:   119.00 ml 62.81 ml/m LA Biplane Vol: 121.0 ml 63.87 ml/m  AORTIC VALVE                    PULMONIC VALVE AV Area (Vmax):    3.01 cm     PV Vmax:          0.99 m/s AV Area (Vmean):   3.30 cm     PV Peak grad:     3.9 mmHg AV Area (VTI):     3.63 cm     PR End Diast Vel: 10.11 msec AV Vmax:           99.10 cm/s AV Vmean:          69.000 cm/s AV VTI:            0.149 m AV Peak Grad:      3.9 mmHg AV Mean Grad:      2.0 mmHg LVOT Vmax:         95.00 cm/s LVOT Vmean:        72.500 cm/s LVOT VTI:          0.172 m LVOT/AV VTI ratio: 1.15  AORTA Ao Root diam: 2.80 cm Ao Asc diam:  3.40 cm MITRAL VALVE              TRICUSPID VALVE MV Area VTI:  2.90 cm  TR Peak grad:   34.3 mmHg MV Peak grad: 5.7 mmHg   TR Vmax:        293.00 cm/s MV Mean grad: 2.0 mmHg MV Vmax:      1.19 m/s   SHUNTS MV Vmean:     70.6 cm/s  Systemic VTI:  0.17 m                          Systemic Diam: 2.00 cm Vishnu Priya Mallipeddi Electronically signed by Winfield Rast Mallipeddi Signature Date/Time: 09/22/2023/1:06:40 PM    Final    US Abdomen Limited RUQ (LIVER/GB) Result Date: 09/22/2023 CLINICAL DATA:  Cholelithiasis on recent CT examination EXAM: ULTRASOUND ABDOMEN LIMITED RIGHT UPPER QUADRANT COMPARISON:  09/21/2023 FINDINGS: Gallbladder: Gallbladder is well distended with gallbladder sludge and the single large 2.9 cm stone similar to that noted on prior CT examination. No pericholecystic fluid is noted. Negative sonographic Murphy's sign is elicited. Common bile duct: Diameter: 2.7 mm. Liver: Increased echogenicity consistent with fatty infiltration. Portal vein is patent on color Doppler imaging with normal direction of blood flow towards the liver. Other: None. IMPRESSION: Cholelithiasis and gallbladder sludge. Fatty liver. Electronically Signed   By: Alcide Clever M.D.   On: 09/22/2023 10:24   CT Angio Chest PE W and/or Wo Contrast Result Date: 09/21/2023 CLINICAL DATA:  Weakness and nausea for 1 week, tachycardia, positive D-dimer EXAM: CT ANGIOGRAPHY CHEST WITH CONTRAST TECHNIQUE: Multidetector CT imaging of the chest was performed using the standard protocol during bolus administration of intravenous contrast. Multiplanar CT image reconstructions and MIPs were obtained to evaluate the vascular anatomy. RADIATION DOSE REDUCTION: This exam was performed according to the departmental dose-optimization program which includes automated exposure control, adjustment of the mA and/or kV according to patient size and/or use of iterative reconstruction technique. CONTRAST:  60mL OMNIPAQUE IOHEXOL 350 MG/ML SOLN COMPARISON:  09/21/2023 FINDINGS:  Cardiovascular: This is a technically adequate evaluation of the pulmonary vasculature. No filling defects or pulmonary emboli. Mild cardiomegaly, with prominent biatrial dilatation. Single lead pacer identified, lead within the right ventricle. No evidence of thoracic aortic aneurysm or dissection. Atherosclerosis of the aorta and coronary vasculature. Mediastinum/Nodes: No enlarged mediastinal, hilar, or axillary lymph nodes. Thyroid gland, trachea, and esophagus demonstrate no significant findings. Small hiatal hernia. Lungs/Pleura: There are scattered bilateral areas of ground-glass airspace disease, favor multifocal atypical pneumonia such as viral etiology. Background of basilar predominant subpleural scarring. No effusion or pneumothorax. Central airways are patent. Upper Abdomen: Gallbladder is distended, with high attenuation material in the bladder lumen possibly representing vicarious excretion of previously administered contrast. If no recent contrasted procedure, this could reflect underlying sludge. Lower attenuation filling defects are seen within the gallbladder neck and within the gallbladder fundus, consistent with noncalcified gallstones. The gallstone at the gallbladder neck may be impacted. No CT findings of cholecystitis, though the gallbladder is incompletely imaged due to slice selection. The remainder of the upper abdomen is unremarkable. Musculoskeletal: There are age indeterminate but chronic appearing compression deformities at T2 and T8 with resulting vertebra plana. Likely chronic compression deformity superior endplate of the T12 vertebral body, with less than 10% loss of height. No evidence of acute fracture. Reconstructed images demonstrate no additional findings. Review of the MIP images confirms the above findings. IMPRESSION: 1. No evidence of pulmonary embolus. 2. Multifocal bilateral ground-glass airspace disease superimposed upon chronic interstitial scarring. I would favor  multifocal atypical pneumonia such as viral etiology, though atypical edema or  inflammatory alveolitis could be considered in the appropriate setting. 3. Noncalcified gallstones, with possible impacted stone at the gallbladder neck. High attenuation material filling the gallbladder lumen may reflect vicarious excretion of previous contrast or sludge. No CT evidence of acute cholecystitis. If gallbladder pathology is suspected, right upper quadrant ultrasound may be useful. 4. Cardiomegaly, with prominent biatrial dilatation. 5. Aortic Atherosclerosis (ICD10-I70.0). Coronary artery atherosclerosis. Electronically Signed   By: Sharlet Salina M.D.   On: 09/21/2023 16:48   DG Chest Port 1 View Result Date: 09/21/2023 CLINICAL DATA:  Shortness of breath. EXAM: PORTABLE CHEST 1 VIEW COMPARISON:  Chest radiograph dated October 26, 2018. FINDINGS: The heart size is mildly enlarged. The mediastinal contours are within normal limits. Stable left chest wall AICD in place. Diffuse bilateral chronic appearing interstitial markings. Mild left basilar atelectasis/scarring. No focal consolidation, sizeable pleural effusion, or pneumothorax. No acute osseous abnormality. IMPRESSION: 1. Chronic appearing interstitial changes. No acute cardiopulmonary findings. 2. Mild cardiomegaly. Electronically Signed   By: Hart Robinsons M.D.   On: 09/21/2023 13:54   Scheduled Meds:  apixaban  2.5 mg Oral BID   bisacodyl  10 mg Oral QHS   buPROPion  100 mg Oral Once   buPROPion  100 mg Oral TID   clonazePAM  0.5 mg Oral BID   dextromethorphan-guaiFENesin  1 tablet Oral BID   diltiazem  300 mg Oral Daily   DULoxetine  30 mg Oral Daily   ipratropium-albuterol  3 mL Nebulization TID   levothyroxine  25 mcg Oral Daily   losartan  50 mg Oral Daily   methylPREDNISolone (SOLU-MEDROL) injection  40 mg Intravenous Q12H   metoprolol  200 mg Oral Daily   polyethylene glycol  17 g Oral BID   QUEtiapine  50 mg Oral QHS   sodium chloride  flush  3 mL Intravenous Q12H   sodium chloride flush  3 mL Intravenous Q12H   Continuous Infusions:  sodium chloride 83 mL/hr at 09/22/23 1628   azithromycin Stopped (09/21/23 2057)   cefTRIAXone (ROCEPHIN)  IV Stopped (09/22/23 1049)    LOS: 1 day   Shon Hale M.D on 09/22/2023 at 5:29 PM  Go to [email or web address] - for contact info  [facility] - Office  [phone]  If 7PM-7AM, please contact night-coverage [email or web address] 09/22/2023, 5:29 PM

## 2023-09-23 ENCOUNTER — Telehealth: Payer: Self-pay | Admitting: Cardiology

## 2023-09-23 DIAGNOSIS — I482 Chronic atrial fibrillation, unspecified: Secondary | ICD-10-CM

## 2023-09-23 DIAGNOSIS — Z950 Presence of cardiac pacemaker: Secondary | ICD-10-CM

## 2023-09-23 DIAGNOSIS — J9601 Acute respiratory failure with hypoxia: Secondary | ICD-10-CM | POA: Diagnosis not present

## 2023-09-23 LAB — VITAMIN B12: Vitamin B-12: 1195 pg/mL — ABNORMAL HIGH (ref 180–914)

## 2023-09-23 LAB — COMPREHENSIVE METABOLIC PANEL
ALT: 20 U/L (ref 0–44)
AST: 25 U/L (ref 15–41)
Albumin: 2.4 g/dL — ABNORMAL LOW (ref 3.5–5.0)
Alkaline Phosphatase: 89 U/L (ref 38–126)
Anion gap: 9 (ref 5–15)
BUN: 24 mg/dL — ABNORMAL HIGH (ref 8–23)
CO2: 24 mmol/L (ref 22–32)
Calcium: 9 mg/dL (ref 8.9–10.3)
Chloride: 99 mmol/L (ref 98–111)
Creatinine, Ser: 1.44 mg/dL — ABNORMAL HIGH (ref 0.44–1.00)
GFR, Estimated: 37 mL/min — ABNORMAL LOW (ref >60)
Glucose, Bld: 187 mg/dL — ABNORMAL HIGH (ref 70–99)
Potassium: 4.1 mmol/L (ref 3.5–5.1)
Sodium: 132 mmol/L — ABNORMAL LOW (ref 135–145)
Total Bilirubin: 1.1 mg/dL (ref 0.0–1.2)
Total Protein: 6.1 g/dL — ABNORMAL LOW (ref 6.5–8.1)

## 2023-09-23 LAB — LEGIONELLA PNEUMOPHILA SEROGP 1 UR AG: L. pneumophila Serogp 1 Ur Ag: NEGATIVE

## 2023-09-23 MED ORDER — APIXABAN 5 MG PO TABS
5.0000 mg | ORAL_TABLET | Freq: Two times a day (BID) | ORAL | Status: DC
  Administered 2023-09-23 – 2023-09-24 (×2): 5 mg via ORAL
  Filled 2023-09-23 (×2): qty 1

## 2023-09-23 NOTE — Telephone Encounter (Signed)
Patient's daughter is calling to see if patient could be seen at [facility]. She is currently in [facility]. She had an appt scheduled with Dr. Wyline Mood on 02/14, but had to cancel and pt's daughter is really insisting that Dr. Wyline Mood visit patient in hospital. Please advise.

## 2023-09-23 NOTE — Evaluation (Signed)
Physical Therapy Evaluation Patient Details Name: Lauren Baker MRN: [id number] DOB: 09/20/1940 Today's Date: 09/23/2023  History of Present Illness  a 83 y.o. 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 intake for 2 to 3 weeks, gradually getting worse and not back to her baseline.  EMS was called and patient was found to have hypoxic respiratory failure 88% on room air.  Patient was placed on 4 L oxygen and was brought into the ED.   Clinical Impression    Pt was limited today's Physical Therapy Evaluation, due to poor HR and SpO2 management. Pt with SOB noted throughout mobility. Hr variability between 90-140 bpm throughout mobility. Tachycardic with mobility, which limited ambulation distance. Attempted weaning pt to RA but put back on 2Ls of oxygen. Desaturation to 83% during ambulation and could not recover with PLB. At baseline pt is independent with mobility and ADLs. Pt demonstrating increased assistance given for bed mobility, transfers, and OOB ambulation due to muscle weakness and deconditioining. Based upon these deficits/impairments, patient will benefit from continued skilled physical therapy services during remainder of hospital stay and at the next recommended venue of care to address deficits and promote return to optimal function.                If plan is discharge home, recommend the following: A little help with walking and/or transfers;A little help with bathing/dressing/bathroom   Can travel by private vehicle        Equipment Recommendations None recommended by PT  Recommendations for Other Services       Functional Status Assessment Patient has had a recent decline in their functional status and demonstrates the ability to make significant improvements in function in a reasonable and  predictable amount of time.     Precautions / Restrictions Precautions Precautions: Fall Restrictions Weight Bearing Restrictions Per Provider Order: No      Mobility  Bed Mobility Overal bed mobility: Needs Assistance Bed Mobility: Supine to Sit     Supine to sit: Min assist     General bed mobility comments: for trunk elevation from flat HOB. Patient Response: Cooperative  Transfers Overall transfer level: Needs assistance   Transfers: Sit to/from Stand Sit to Stand: Min assist           General transfer comment: power to stand from EOB to RW with labored, slow movements. On 2Ls throughout mobility    Ambulation/Gait Ambulation/Gait assistance: Contact guard assist Gait Distance (Feet): 1.5 Feet Assistive device: Rolling walker (2 wheels) Gait Pattern/deviations: Step-to pattern       General Gait Details: lateral side steps at EOB due to fatigue and inconsistent Spo2 and HR reading. CGA for balance and RW management.  Stairs            Wheelchair Mobility     Tilt Bed Tilt Bed Patient Response: Cooperative  Modified Rankin (Stroke Patients Only)       Balance Overall balance assessment: Needs assistance Sitting-balance support: No upper extremity supported Sitting balance-Leahy Scale: Fair Sitting balance - Comments: sitting EOB     Standing balance-Leahy Scale: Fair Standing balance comment: with RW at EOB                             Pertinent Vitals/Pain Pain Assessment Pain Assessment: No/denies pain  Home Living Family/patient expects to be discharged to:: Private residence Living Arrangements: Children Available Help at Discharge: Family;Available PRN/intermittently Type of Home: House Home Access: Ramped entrance       Home Layout: One level Home Equipment: Cane - quad;Shower seat;Hand held shower head      Prior Function Prior Level of Function : Independent/Modified Independent              Mobility Comments: independent with QC for household ambulation and short distance community ambulation ADLs Comments: independent- reports sponge bath level     Extremity/Trunk Assessment   Upper Extremity Assessment Upper Extremity Assessment: Defer to OT evaluation    Lower Extremity Assessment Lower Extremity Assessment: Generalized weakness;RLE deficits/detail;LLE deficits/detail RLE Sensation: WNL LLE Sensation: WNL       Communication   Communication Communication: No apparent difficulties    Cognition Arousal: Alert Behavior During Therapy: WFL for tasks assessed/performed   PT - Cognitive impairments: No apparent impairments                                 Cueing Cueing Techniques: Verbal cues, Tactile cues     General Comments      Exercises     Assessment/Plan    PT Assessment Patient needs continued PT services  PT Problem List Decreased strength;Decreased activity tolerance;Decreased balance;Decreased mobility       PT Treatment Interventions DME instruction;Gait training;Functional mobility training;Therapeutic activities;Therapeutic exercise;Balance training;Neuromuscular re-education    PT Goals (Current goals can be found in the Care Plan section)  Acute Rehab PT Goals Patient Stated Goal: return home PT Goal Formulation: With patient Time For Goal Achievement: 10/07/23 Potential to Achieve Goals: Good    Frequency Min 3X/week     Co-evaluation               AM-PAC PT "6 Clicks" Mobility  Outcome Measure Help needed turning from your back to your side while in a flat bed without using bedrails?: A Little Help needed moving from lying on your back to sitting on the side of a flat bed without using bedrails?: A Little Help needed moving to and from a bed to a chair (including a wheelchair)?: A Little Help needed standing up from a chair using your arms (e.g., wheelchair or bedside chair)?: A Little Help needed to  walk in hospital room?: A Little Help needed climbing 3-5 steps with a railing? : A Lot 6 Click Score: 17    End of Session Equipment Utilized During Treatment: Gait belt Activity Tolerance: Patient tolerated treatment well Patient left: in bed;with call bell/phone within reach;with bed alarm set Nurse Communication: Mobility status PT Visit Diagnosis: Unsteadiness on feet (R26.81);Muscle weakness (generalized) (M62.81)    Time: 8469-6295 PT Time Calculation (min) (ACUTE ONLY): 25 min   Charges:   PT Evaluation $PT Eval Moderate Complexity: 1 Mod   PT General Charges $$ ACUTE PT VISIT: 1 Visit         Elie Goody, DPT [facility]- [place] 336 [phone] office  Nelida Meuse 09/23/2023, 11:14 AM

## 2023-09-23 NOTE — Plan of Care (Signed)
  Problem: Acute Rehab PT Goals(only PT should resolve) Goal: Pt Will Go Supine/Side To Sit Flowsheets (Taken 09/23/2023 1118) Pt will go Supine/Side to Sit: Independently Goal: Patient Will Transfer Sit To/From Stand Flowsheets (Taken 09/23/2023 1118) Patient will transfer sit to/from stand: Independently Goal: Pt Will Transfer Bed To Chair/Chair To Bed Flowsheets (Taken 09/23/2023 1118) Pt will Transfer Bed to Chair/Chair to Bed: Independently Goal: Pt Will Perform Standing Balance Or Pre-Gait Flowsheets (Taken 09/23/2023 1118) Pt will perform standing balance or pre-gait: Independently Goal: Pt Will Ambulate Flowsheets (Taken 09/23/2023 1118) Pt will Ambulate:  75 feet  with modified independence  with least restrictive assistive device  Nelida Meuse PT, DPT [facility]- [place] 336 [phone] office

## 2023-09-23 NOTE — Telephone Encounter (Signed)
Dr.Branch is not in the [place] office and cannot see patient in her hospital room.  He has agreed to add patient on hospital day, Thursday,10/01/23 at 2:20 pm  Daughter stated if they cannot bring her they will call and cancel the appointment ahead of time

## 2023-09-23 NOTE — TOC Initial Note (Signed)
Transition of Care [facility]) - Initial/Assessment Note    Patient Details  Name: Lauren Baker MRN: [id number] Date of Birth: 1940-10-06  Transition of Care [facility]) CM/SW Contact:    Villa Herb, LCSWA Phone Number: 09/23/2023, 1:13 PM  Clinical Narrative:                 CSW updated that PT is recommending SNF for pt at D/C. CSW met with pt and family at bedside to complete assessment. Pt states she lives alone. Pt is independent in completing her ADLs. Pts family is able to provide transportation when needed. Pt has a cane, walker, shower chair and BSC to use in the home when needed. CSW spoke with pt about SNF recommendation, pt is not interested in this and prefers to return home with [facility] services. Pt does not have an agency preference. TOC to follow.   Expected Discharge Plan: Home w Home Health Services Barriers to Discharge: Continued Medical Work up   Patient Goals and CMS Choice Patient states their goals for this hospitalization and ongoing recovery are:: return home CMS Medicare.gov Compare Post Acute Care list provided to:: Patient Choice offered to / list presented to : Patient [employer] ownership interest in [facility].provided to:: Patient    Expected Discharge Plan and Services In-house Referral: Clinical Social Work Discharge Planning Services: CM Consult Post Acute Care Choice: Home Health Living arrangements for the past 2 months: Single Family Home                                      Prior Living Arrangements/Services Living arrangements for the past 2 months: Single Family Home Lives with:: Self Patient language and need for interpreter reviewed:: Yes Do you feel safe going back to the place where you live?: Yes      Need for Family Participation in Patient Care: Yes (Comment) Care giver support system in place?: Yes (comment) Current home services: DME Criminal Activity/Legal Involvement Pertinent to Current  Situation/Hospitalization: No - Comment as needed  Activities of Daily Living   ADL Screening (condition at time of admission) Independently performs ADLs?: No Does the patient have a NEW difficulty with bathing/dressing/toileting/self-feeding that is expected to last >3 days?: No Does the patient have a NEW difficulty with getting in/out of bed, walking, or climbing stairs that is expected to last >3 days?: No Does the patient have a NEW difficulty with communication that is expected to last >3 days?: No Is the patient deaf or have difficulty hearing?: No Does the patient have difficulty seeing, even when wearing glasses/contacts?: No Does the patient have difficulty concentrating, remembering, or making decisions?: No  Permission Sought/Granted                  Emotional Assessment Appearance:: Appears stated age Attitude/Demeanor/Rapport: Engaged Affect (typically observed): Accepting Orientation: : Oriented to Self, Oriented to Place, Oriented to  Time, Oriented to Situation Alcohol / Substance Use: Not Applicable Psych Involvement: No (comment)  Admission diagnosis:  Respiratory failure with hypoxia (HCC) [J96.91] Patient Active Problem List   Diagnosis Date Noted   Respiratory failure with hypoxia (HCC) 09/21/2023   Pacemaker 03/04/2022   Metabolic acidosis 10/19/2018   Bradycardia 10/18/2018   Acute respiratory failure with hypoxia (HCC)    Shock, cardiogenic (HCC)    Accidental overdose    Chronic atrial fibrillation (HCC) 03/12/2018   Essential hypertension 03/12/2018  PCP:  Elfredia Nevins, MD Pharmacy:   [facility] - [place], [place] - [address] [place] [address] Phone: [phone] Fax: [phone]     Social Drivers of Health (SDOH) Social History: SDOH Screenings   Food Insecurity: No Food Insecurity (09/22/2023)  Housing: Low Risk  (09/22/2023)  Transportation Needs: No Transportation Needs (09/22/2023)   Utilities: Not At Risk (09/22/2023)  Social Connections: Moderately Integrated (09/22/2023)  Tobacco Use: Low Risk  (09/21/2023)   SDOH Interventions:     Readmission Risk Interventions    09/23/2023    1:12 PM  Readmission Risk Prevention Plan  Medication Screening Complete  Transportation Screening Complete

## 2023-09-23 NOTE — Plan of Care (Signed)
Problem: Education: Goal: Knowledge of General Education information will improve Description: Including pain rating scale, medication(s)/side effects and non-pharmacologic comfort measures Outcome: Progressing   Problem: Health Behavior/Discharge Planning: Goal: Ability to manage health-related needs will improve Outcome: Progressing   Problem: Nutrition: Goal: Adequate nutrition will be maintained Outcome: Progressing

## 2023-09-23 NOTE — Progress Notes (Signed)
Mobility Specialist Progress Note:    09/23/23 1546  Mobility  Activity Ambulated with assistance in room;Stood at bedside  Level of Assistance Minimal assist, patient does 75% or more  Assistive Device Front wheel walker  Distance Ambulated (ft) 8 ft  Range of Motion/Exercises Active;All extremities  Activity Response Tolerated well  Mobility Referral Yes  Mobility visit 1 Mobility  Mobility Specialist Start Time (ACUTE ONLY) 1510  Mobility Specialist Stop Time (ACUTE ONLY) 1540  Mobility Specialist Time Calculation (min) (ACUTE ONLY) 30 min   Pt received in bed, daughter in law at bedside. MinA to stand and ambulate with RW. Tolerated well, see O2 sats below. Pt could not ambulate far d/t SOB and fatigue. Returned supine, notified nursing staff. All needs met.   SpO2 79% on RA standing EOB  SpO2 90% on 4L standing EOB SpO2 86% on 4L during ambulation SpO2 92% on 5L during ambulation  [employer] Mobility Specialist Please contact via SecureChat or  Rehab office at [phone]

## 2023-09-23 NOTE — Progress Notes (Signed)
SATURATION QUALIFICATIONS: (This note is used to comply with regulatory documentation for home oxygen)   Patient Saturations on Room Air while Ambulating = 79% Standing at bedside  Patient Saturations on 5 Liters of oxygen while Ambulating = 92%  Please briefly explain why patient needs home oxygen: patient oxygen saturations decrease when ambulating on room air.

## 2023-09-23 NOTE — Progress Notes (Signed)
PROGRESS NOTE  Lauren Baker, is a 83 y.o. female, DOB - [birth year], NWG:[id number]  Admit date - 09/21/2023   Admitting Physician Lauren Santa, MD  Outpatient Primary MD for the patient is Lauren Nevins, MD  LOS - 2  Chief Complaint  Patient presents with   Weakness      Brief Narrative:   83 y.o. 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   -Assessment and Plan: 1)Acute Respiratory failure with hypoxia- multifocal pneumonia, atypical, possible viral infection -CTA chest w/o Pulm Embolism  -Multifocal bilateral ground-glass airspace disease superimposed upon chronic interstitial scarring. Favor multifocal atypical pneumonia such as viral etiology, though atypical edema or inflammatory alveolitis could be considered in the appropriate setting. -PCT < 0.10 WBC 6.9 >>4.6 Strep Pneumo Neg Covid, Flu and RSV Neg, 20 path Viral Resp Panel neg C/n iv Solu-Medrol ; planning for steroids tapering at discharge. -Okay to discontinue Rocephin and azithromycin on 09/22/2023 -Continue symptomatic treatment with DuoNeb as needed, Mucinex   -Continue incentive spirometry Unable to wean off oxygen ---currently on 4L/min oxygen  -Will arrange home oxygen. -Outpatient follow-up with pulmonologist recommended.  # Hypokalemia- Normalized after replacement # Hypomagnesemia-- -Normalized after replacement # Hyponatremia, mild,  Monitor electrolytes and replete as needed.   # Chronic A-fib with RVR, s/p PPM, ventricular paced rhythm -Echocardiogram from 09/22/2023 with preserved EF of 55 to 60%, no aortic stenosis, no mitral stenosis, left and right atrium severely dilated, no significant change from prior echo from March 2020 -Resumed Eliquis for stroke prevention,  -c/n Cardizem CD and Toprol-XL for rate control -Continue patient follow-up with cardiology service.  # Hypertension-c/n losartan 50 mg  p.o. daily -Cardizem and metoprolol as above -Monitor BP and titrate medication accordingly -Continue to monitor on telemetry   # Gallstones-incidental finding on CT abdomen right upper quadrant ultrasound with gallstones without acute cholecystitis -Clinically Murphy sign is negative, no leukocytosis no emesis, no abdominal pain Patient denies any abdominal pain, asymptomatic -LFTs noted  # Hypothyroid, continued Synthroid.  # Depression: Continue Klonopin, Wellbutrin, Cymbalta and Seroquel  Continue supportive care and constant orientation.   -Generalized weakness and deconditioning--- physical therapy eval appreciated; recommendation for skilled nursing facility provided; patient has declined at and is sent home health services instead.  Status is: Inpatient   Disposition: The patient is from: Home              Anticipated d/c is to: Home              Anticipated d/c date is: 1 day              Patient currently is not medically stable to d/c. Barriers: Not Clinically Stable-   Code Status :  -  Code Status: Full Code   Family Communication:    (patient is alert, awake and coherent)  Discussed with patient's daughter Lauren Baker  DVT Prophylaxis  :   - SCDs   apixaban (ELIQUIS) tablet 5 mg   Lab Results  Component Value Date   PLT 230 09/22/2023   Inpatient Medications  Scheduled Meds:  apixaban  5 mg Oral BID   bisacodyl  10 mg Oral QHS   buPROPion  100 mg Oral Once   buPROPion  100 mg Oral TID   clonazePAM  0.5 mg Oral BID   dextromethorphan-guaiFENesin  1 tablet Oral BID   diltiazem  300 mg Oral Daily   DULoxetine  30 mg  Oral Daily   ipratropium-albuterol  3 mL Nebulization TID   levothyroxine  25 mcg Oral Daily   losartan  50 mg Oral Daily   methylPREDNISolone (SOLU-MEDROL) injection  40 mg Intravenous Q12H   metoprolol  200 mg Oral Daily   polyethylene glycol  17 g Oral BID   QUEtiapine  50 mg Oral QHS   sodium chloride flush  3 mL Intravenous Q12H   sodium  chloride flush  3 mL Intravenous Q12H   Continuous Infusions:   PRN Meds:.acetaminophen **OR** acetaminophen, albuterol, bisacodyl, ondansetron **OR** ondansetron (ZOFRAN) IV, mouth rinse, sodium chloride flush   Anti-infectives (From admission, onward)    Start     Dose/Rate Route Frequency Ordered Stop   09/22/23 1000  cefTRIAXone (ROCEPHIN) 2 g in sodium chloride 0.9 % 100 mL IVPB  Status:  Discontinued        2 g 200 mL/hr over 30 Minutes Intravenous Every 24 hours 09/22/23 0801 09/22/23 1808   09/21/23 1800  azithromycin (ZITHROMAX) 500 mg in sodium chloride 0.9 % 250 mL IVPB  Status:  Discontinued        500 mg 250 mL/hr over 60 Minutes Intravenous Every 24 hours 09/21/23 1739 09/23/23 1349       Subjective: Lauren Baker no nausea, no vomiting.  Patient reports no chest pain.  Daughter-in-law at bedside has been updated.  Patient generally weak requiring 4 L nasal cannula supplementation to maintain saturation and experiencing intermittent coughing spells.  Objective: Vitals:   09/23/23 0612 09/23/23 0746 09/23/23 1000 09/23/23 1432  BP: 112/71  (!) 110/55 105/69  Pulse:   (!) 103 (!) 102  Resp: 20   18  Temp: 97.8 F (36.6 C)   (!) 97.5 F (36.4 C)  TempSrc:    Oral  SpO2: 95% 95% 98% 96%  Weight:      Height:        Intake/Output Summary (Last 24 hours) at 09/23/2023 1941 Last data filed at 09/23/2023 0500 Gross per 24 hour  Intake 360 ml  Output --  Net 360 ml   Filed Weights   09/21/23 1120 09/22/23 0908  Weight: 81.6 kg 75.9 kg    Physical Exam General exam: Alert, awake, oriented x 3; reports feeling better.  No fever, no nausea or vomiting. Respiratory system: 4L nasal cannula supplementation in place; no using accessory muscle.  Positive rhonchi bilaterally. Cardiovascular system: Rate controlled, no rubs, no gallops, no JVD. Gastrointestinal system: Abdomen is nondistended, soft and nontender. No organomegaly or masses felt. Normal bowel sounds  heard. Central nervous system: Generally weak.  No focal neurological deficits. Extremities: No cyanosis or clubbing. Skin: No petechiae. Psychiatry: Mood & affect appropriate.   Data Reviewed: I have personally reviewed following labs and imaging studies  CBC: Recent Labs  Lab 09/21/23 1244 09/22/23 0509  WBC 6.9 4.6  NEUTROABS 5.1  --   HGB 12.8 12.3  HCT 37.5 36.7  MCV 91.0 92.4  PLT 272 230   Basic Metabolic Panel: Recent Labs  Lab 09/21/23 1244 09/21/23 1438 09/22/23 0509 09/23/23 0503  NA 134*  --  132* 132*  K 2.9*  --  4.0 4.1  CL 95*  --  98 99  CO2 28  --  25 24  GLUCOSE 170*  --  170* 187*  BUN 16  --  19 24*  CREATININE 1.24*  --  1.58* 1.44*  CALCIUM 8.7*  --  8.9 9.0  MG  --  1.3* 2.0  --  PHOS  --  2.5 3.2  --    GFR: Estimated Creatinine Clearance: 30.9 mL/min (A) (by C-G formula based on SCr of 1.44 mg/dL (H)).  Liver Function Tests: Recent Labs  Lab 09/21/23 1438 09/22/23 0509 09/23/23 0503  AST 27 22 25   ALT 20 18 20   ALKPHOS 90 89 89  BILITOT 3.4* 2.5* 1.1  PROT 6.6 6.2* 6.1*  ALBUMIN 2.6* 2.4* 2.4*   Recent Results (from the past 240 hours)  Resp panel by RT-PCR (RSV, Flu A&B, Covid) Anterior Nasal Swab     Status: None   Collection Time: 09/21/23 12:05 PM   Specimen: Anterior Nasal Swab  Result Value Ref Range Status   SARS Coronavirus 2 by RT PCR NEGATIVE NEGATIVE Final    Comment: (NOTE) SARS-CoV-2 target nucleic acids are NOT DETECTED.  The SARS-CoV-2 RNA is generally detectable in upper respiratory specimens during the acute phase of infection. The lowest concentration of SARS-CoV-2 viral copies this assay can detect is 138 copies/mL. A negative result does not preclude SARS-Cov-2 infection and should not be used as the sole basis for treatment or other patient management decisions. A negative result may occur with  improper specimen collection/handling, submission of specimen other than nasopharyngeal swab, presence of  viral mutation(s) within the areas targeted by this assay, and inadequate number of viral copies(<138 copies/mL). A negative result must be combined with clinical observations, patient history, and epidemiological information. The expected result is Negative.  Fact Sheet for Patients:  BloggerCourse.com  Fact Sheet for Healthcare Providers:  SeriousBroker.it  This test is no t yet approved or cleared by the [place] FDA and  has been authorized for detection and/or diagnosis of SARS-CoV-2 by FDA under an Emergency Use Authorization (EUA). This EUA will remain  in effect (meaning this test can be used) for the duration of the COVID-19 declaration under Section 564(b)(1) of the Act, 21 U.S.C.section 360bbb-3(b)(1), unless the authorization is terminated  or revoked sooner.       Influenza A by PCR NEGATIVE NEGATIVE Final   Influenza B by PCR NEGATIVE NEGATIVE Final    Comment: (NOTE) The Xpert Xpress SARS-CoV-2/FLU/RSV plus assay is intended as an aid in the diagnosis of influenza from Nasopharyngeal swab specimens and should not be used as a sole basis for treatment. Nasal washings and aspirates are unacceptable for Xpert Xpress SARS-CoV-2/FLU/RSV testing.  Fact Sheet for Patients: BloggerCourse.com  Fact Sheet for Healthcare Providers: SeriousBroker.it  This test is not yet approved or cleared by the [place] FDA and has been authorized for detection and/or diagnosis of SARS-CoV-2 by FDA under an Emergency Use Authorization (EUA). This EUA will remain in effect (meaning this test can be used) for the duration of the COVID-19 declaration under Section 564(b)(1) of the Act, 21 U.S.C. section 360bbb-3(b)(1), unless the authorization is terminated or revoked.     Resp Syncytial Virus by PCR NEGATIVE NEGATIVE Final    Comment: (NOTE) Fact Sheet for  Patients: BloggerCourse.com  Fact Sheet for Healthcare Providers: SeriousBroker.it  This test is not yet approved or cleared by the [place] FDA and has been authorized for detection and/or diagnosis of SARS-CoV-2 by FDA under an Emergency Use Authorization (EUA). This EUA will remain in effect (meaning this test can be used) for the duration of the COVID-19 declaration under Section 564(b)(1) of the Act, 21 U.S.C. section 360bbb-3(b)(1), unless the authorization is terminated or revoked.  Performed at [facility], [address], [place], [place] [address]  Respiratory (~20 pathogens) panel by PCR     Status: None   Collection Time: 09/21/23  6:20 PM   Specimen: Nasopharyngeal Swab; Respiratory  Result Value Ref Range Status   Adenovirus NOT DETECTED NOT DETECTED Final   Coronavirus 229E NOT DETECTED NOT DETECTED Final    Comment: (NOTE) The Coronavirus on the Respiratory Panel, DOES NOT test for the novel  Coronavirus (2019 nCoV)    Coronavirus HKU1 NOT DETECTED NOT DETECTED Final   Coronavirus NL63 NOT DETECTED NOT DETECTED Final   Coronavirus OC43 NOT DETECTED NOT DETECTED Final   Metapneumovirus NOT DETECTED NOT DETECTED Final   Rhinovirus / Enterovirus NOT DETECTED NOT DETECTED Final   Influenza A NOT DETECTED NOT DETECTED Final   Influenza B NOT DETECTED NOT DETECTED Final   Parainfluenza Virus 1 NOT DETECTED NOT DETECTED Final   Parainfluenza Virus 2 NOT DETECTED NOT DETECTED Final   Parainfluenza Virus 3 NOT DETECTED NOT DETECTED Final   Parainfluenza Virus 4 NOT DETECTED NOT DETECTED Final   Respiratory Syncytial Virus NOT DETECTED NOT DETECTED Final   Bordetella pertussis NOT DETECTED NOT DETECTED Final   Bordetella Parapertussis NOT DETECTED NOT DETECTED Final   Chlamydophila pneumoniae NOT DETECTED NOT DETECTED Final   Mycoplasma pneumoniae NOT DETECTED NOT DETECTED Final    Comment: Performed at [facility], [address] [place], [place] [address]    Radiology Studies: ECHOCARDIOGRAM COMPLETE Result Date: 09/22/2023    ECHOCARDIOGRAM REPORT   Patient Name:   Lauren Baker Date of Exam: 09/22/2023 Medical Rec #:  [id number]         Height:       68.0 in Accession #:    [id number]        Weight:       167.3 lb Date of Birth:  Jul 30, 1941         BSA:          1.895 m Patient Age:    82 years          BP:           108/76 mmHg Patient Gender: F                 HR:           99 bpm. [facility] Procedure: 2D Echo, Cardiac Doppler and Color Doppler Indications:    A Fib  History:        Patient has prior history of Echocardiogram examinations, most                 recent 10/19/2018. Arrythmias:Atrial Fibrillation; Risk                 Factors:Hypertension.  Sonographer:    Amy Chionchio Referring Phys: [id number] Lauren Baker IMPRESSIONS  1. Left ventricular ejection fraction, by estimation, is 55 to 60%. The left ventricle has normal function. Left ventricular endocardial border not optimally defined to evaluate regional wall motion. Indeterminate diastolic filling due to E-A fusion.  2. Right ventricular systolic function is normal. The right ventricular size is mildly enlarged.  3. Left atrial size was severely dilated.  4. Right atrial size was severely dilated.  5. The mitral valve is normal in structure. Mild mitral valve regurgitation. No evidence of mitral stenosis.  6. The aortic valve is tricuspid. Aortic valve regurgitation is trivial. No aortic stenosis is present. Comparison(s): No significant change from prior study. FINDINGS  Left Ventricle: Left ventricular ejection  fraction, by estimation, is 55 to 60%. The left ventricle has normal function. Left ventricular endocardial border not optimally defined to evaluate regional wall motion. The left ventricular internal cavity size was normal in size. There is no left ventricular hypertrophy. Left ventricular diastolic function could not  be evaluated due to atrial fibrillation. Indeterminate diastolic filling due to E-A fusion. Right Ventricle: The right ventricular size is mildly enlarged. No increase in right ventricular wall thickness. Right ventricular systolic function is normal. Left Atrium: Left atrial size was severely dilated. Right Atrium: Right atrial size was severely dilated. Prominent Eustachian valve. Pericardium: Trivial pericardial effusion is present. Mitral Valve: The mitral valve is normal in structure. Mild mitral valve regurgitation. No evidence of mitral valve stenosis. MV peak gradient, 5.7 mmHg. The mean mitral valve gradient is 2.0 mmHg. Tricuspid Valve: The tricuspid valve is normal in structure. Tricuspid valve regurgitation is mild . No evidence of tricuspid stenosis. Aortic Valve: The aortic valve is tricuspid. Aortic valve regurgitation is trivial. No aortic stenosis is present. Aortic valve mean gradient measures 2.0 mmHg. Aortic valve peak gradient measures 3.9 mmHg. Aortic valve area, by VTI measures 3.63 cm. Pulmonic Valve: The pulmonic valve was normal in structure. Pulmonic valve regurgitation is mild. No evidence of pulmonic stenosis. Aorta: The aortic root and ascending aorta are structurally normal, with no evidence of dilitation. Venous: The inferior vena cava was not well visualized. IAS/Shunts: The interatrial septum was not assessed.  LEFT VENTRICLE PLAX 2D LVIDd:         3.20 cm LVIDs:         2.10 cm LV PW:         1.00 cm LV IVS:        1.00 cm LVOT diam:     2.00 cm LV SV:         54 LV SV Index:   29 LVOT Area:     3.14 cm  RIGHT VENTRICLE RV Basal diam:  4.70 cm RV Mid diam:    3.50 cm TAPSE (M-mode): 1.5 cm LEFT ATRIUM              Index        RIGHT ATRIUM           Index LA Vol (A2C):   106.0 ml 55.95 ml/m  RA Area:     31.10 cm LA Vol (A4C):   137.0 ml 72.31 ml/m  RA Volume:   119.00 ml 62.81 ml/m LA Biplane Vol: 121.0 ml 63.87 ml/m  AORTIC VALVE                    PULMONIC VALVE AV Area  (Vmax):    3.01 cm     PV Vmax:          0.99 m/s AV Area (Vmean):   3.30 cm     PV Peak grad:     3.9 mmHg AV Area (VTI):     3.63 cm     PR End Diast Vel: 10.11 msec AV Vmax:           99.10 cm/s AV Vmean:          69.000 cm/s AV VTI:            0.149 m AV Peak Grad:      3.9 mmHg AV Mean Grad:      2.0 mmHg LVOT Vmax:         95.00 cm/s LVOT Vmean:  72.500 cm/s LVOT VTI:          0.172 m LVOT/AV VTI ratio: 1.15  AORTA Ao Root diam: 2.80 cm Ao Asc diam:  3.40 cm MITRAL VALVE             TRICUSPID VALVE MV Area VTI:  2.90 cm   TR Peak grad:   34.3 mmHg MV Peak grad: 5.7 mmHg   TR Vmax:        293.00 cm/s MV Mean grad: 2.0 mmHg MV Vmax:      1.19 m/s   SHUNTS MV Vmean:     70.6 cm/s  Systemic VTI:  0.17 m                          Systemic Diam: 2.00 cm Vishnu Priya Mallipeddi Electronically signed by Winfield Rast Mallipeddi Signature Date/Time: 09/22/2023/1:06:40 PM    Final    US Abdomen Limited RUQ (LIVER/GB) Result Date: 09/22/2023 CLINICAL DATA:  Cholelithiasis on recent CT examination EXAM: ULTRASOUND ABDOMEN LIMITED RIGHT UPPER QUADRANT COMPARISON:  09/21/2023 FINDINGS: Gallbladder: Gallbladder is well distended with gallbladder sludge and the single large 2.9 cm stone similar to that noted on prior CT examination. No pericholecystic fluid is noted. Negative sonographic Murphy's sign is elicited. Common bile duct: Diameter: 2.7 mm. Liver: Increased echogenicity consistent with fatty infiltration. Portal vein is patent on color Doppler imaging with normal direction of blood flow towards the liver. Other: None. IMPRESSION: Cholelithiasis and gallbladder sludge. Fatty liver. Electronically Signed   By: Alcide Clever M.D.   On: 09/22/2023 10:24   Scheduled Meds:  apixaban  5 mg Oral BID   bisacodyl  10 mg Oral QHS   buPROPion  100 mg Oral Once   buPROPion  100 mg Oral TID   clonazePAM  0.5 mg Oral BID   dextromethorphan-guaiFENesin  1 tablet Oral BID   diltiazem  300 mg Oral Daily   DULoxetine   30 mg Oral Daily   ipratropium-albuterol  3 mL Nebulization TID   levothyroxine  25 mcg Oral Daily   losartan  50 mg Oral Daily   methylPREDNISolone (SOLU-MEDROL) injection  40 mg Intravenous Q12H   metoprolol  200 mg Oral Daily   polyethylene glycol  17 g Oral BID   QUEtiapine  50 mg Oral QHS   sodium chloride flush  3 mL Intravenous Q12H   sodium chloride flush  3 mL Intravenous Q12H   Continuous Infusions:    LOS: 2 days   Vassie Loll M.D on 09/23/2023 at 7:41 PM  Go to [email or web address] - for contact info  [facility] - Office  [phone]  If 7PM-7AM, please contact night-coverage [email or web address] 09/23/2023, 7:41 PM

## 2023-09-24 DIAGNOSIS — J9611 Chronic respiratory failure with hypoxia: Secondary | ICD-10-CM | POA: Diagnosis not present

## 2023-09-24 DIAGNOSIS — E039 Hypothyroidism, unspecified: Secondary | ICD-10-CM | POA: Diagnosis not present

## 2023-09-24 DIAGNOSIS — I4821 Permanent atrial fibrillation: Secondary | ICD-10-CM | POA: Diagnosis not present

## 2023-09-24 DIAGNOSIS — R0902 Hypoxemia: Secondary | ICD-10-CM | POA: Diagnosis not present

## 2023-09-24 DIAGNOSIS — I4891 Unspecified atrial fibrillation: Secondary | ICD-10-CM | POA: Diagnosis not present

## 2023-09-24 DIAGNOSIS — Z950 Presence of cardiac pacemaker: Secondary | ICD-10-CM | POA: Diagnosis not present

## 2023-09-24 DIAGNOSIS — E8729 Other acidosis: Secondary | ICD-10-CM | POA: Diagnosis not present

## 2023-09-24 DIAGNOSIS — J9621 Acute and chronic respiratory failure with hypoxia: Secondary | ICD-10-CM | POA: Diagnosis not present

## 2023-09-24 DIAGNOSIS — E874 Mixed disorder of acid-base balance: Secondary | ICD-10-CM | POA: Diagnosis not present

## 2023-09-24 DIAGNOSIS — J984 Other disorders of lung: Secondary | ICD-10-CM | POA: Diagnosis not present

## 2023-09-24 DIAGNOSIS — K449 Diaphragmatic hernia without obstruction or gangrene: Secondary | ICD-10-CM | POA: Diagnosis not present

## 2023-09-24 DIAGNOSIS — E876 Hypokalemia: Secondary | ICD-10-CM | POA: Diagnosis present

## 2023-09-24 DIAGNOSIS — I10 Essential (primary) hypertension: Secondary | ICD-10-CM

## 2023-09-24 DIAGNOSIS — J80 Acute respiratory distress syndrome: Secondary | ICD-10-CM | POA: Diagnosis not present

## 2023-09-24 DIAGNOSIS — J1008 Influenza due to other identified influenza virus with other specified pneumonia: Secondary | ICD-10-CM | POA: Diagnosis present

## 2023-09-24 DIAGNOSIS — J9601 Acute respiratory failure with hypoxia: Secondary | ICD-10-CM | POA: Diagnosis not present

## 2023-09-24 DIAGNOSIS — D649 Anemia, unspecified: Secondary | ICD-10-CM | POA: Diagnosis not present

## 2023-09-24 DIAGNOSIS — A419 Sepsis, unspecified organism: Secondary | ICD-10-CM | POA: Diagnosis not present

## 2023-09-24 DIAGNOSIS — K219 Gastro-esophageal reflux disease without esophagitis: Secondary | ICD-10-CM

## 2023-09-24 DIAGNOSIS — R918 Other nonspecific abnormal finding of lung field: Secondary | ICD-10-CM | POA: Diagnosis not present

## 2023-09-24 DIAGNOSIS — N1832 Chronic kidney disease, stage 3b: Secondary | ICD-10-CM | POA: Diagnosis not present

## 2023-09-24 DIAGNOSIS — E871 Hypo-osmolality and hyponatremia: Secondary | ICD-10-CM | POA: Diagnosis not present

## 2023-09-24 DIAGNOSIS — J129 Viral pneumonia, unspecified: Secondary | ICD-10-CM | POA: Diagnosis present

## 2023-09-24 DIAGNOSIS — J90 Pleural effusion, not elsewhere classified: Secondary | ICD-10-CM | POA: Diagnosis not present

## 2023-09-24 DIAGNOSIS — J9811 Atelectasis: Secondary | ICD-10-CM | POA: Diagnosis not present

## 2023-09-24 DIAGNOSIS — R000 Tachycardia, unspecified: Secondary | ICD-10-CM | POA: Diagnosis not present

## 2023-09-24 DIAGNOSIS — Y95 Nosocomial condition: Secondary | ICD-10-CM | POA: Diagnosis present

## 2023-09-24 DIAGNOSIS — I5031 Acute diastolic (congestive) heart failure: Secondary | ICD-10-CM | POA: Diagnosis not present

## 2023-09-24 DIAGNOSIS — J111 Influenza due to unidentified influenza virus with other respiratory manifestations: Secondary | ICD-10-CM | POA: Diagnosis not present

## 2023-09-24 DIAGNOSIS — D61818 Other pancytopenia: Secondary | ICD-10-CM | POA: Diagnosis not present

## 2023-09-24 DIAGNOSIS — J189 Pneumonia, unspecified organism: Secondary | ICD-10-CM | POA: Diagnosis not present

## 2023-09-24 DIAGNOSIS — I5030 Unspecified diastolic (congestive) heart failure: Secondary | ICD-10-CM | POA: Diagnosis not present

## 2023-09-24 DIAGNOSIS — R627 Adult failure to thrive: Secondary | ICD-10-CM | POA: Diagnosis present

## 2023-09-24 DIAGNOSIS — Z515 Encounter for palliative care: Secondary | ICD-10-CM | POA: Diagnosis not present

## 2023-09-24 DIAGNOSIS — J09X1 Influenza due to identified novel influenza A virus with pneumonia: Secondary | ICD-10-CM | POA: Diagnosis not present

## 2023-09-24 DIAGNOSIS — I482 Chronic atrial fibrillation, unspecified: Secondary | ICD-10-CM | POA: Diagnosis not present

## 2023-09-24 DIAGNOSIS — J1100 Influenza due to unidentified influenza virus with unspecified type of pneumonia: Secondary | ICD-10-CM | POA: Diagnosis not present

## 2023-09-24 DIAGNOSIS — J929 Pleural plaque without asbestos: Secondary | ICD-10-CM | POA: Diagnosis not present

## 2023-09-24 DIAGNOSIS — R339 Retention of urine, unspecified: Secondary | ICD-10-CM | POA: Diagnosis not present

## 2023-09-24 DIAGNOSIS — Z1152 Encounter for screening for COVID-19: Secondary | ICD-10-CM | POA: Diagnosis not present

## 2023-09-24 DIAGNOSIS — J449 Chronic obstructive pulmonary disease, unspecified: Secondary | ICD-10-CM | POA: Diagnosis not present

## 2023-09-24 DIAGNOSIS — K8020 Calculus of gallbladder without cholecystitis without obstruction: Secondary | ICD-10-CM | POA: Diagnosis not present

## 2023-09-24 DIAGNOSIS — I495 Sick sinus syndrome: Secondary | ICD-10-CM | POA: Diagnosis present

## 2023-09-24 DIAGNOSIS — J470 Bronchiectasis with acute lower respiratory infection: Secondary | ICD-10-CM | POA: Diagnosis present

## 2023-09-24 DIAGNOSIS — R531 Weakness: Secondary | ICD-10-CM | POA: Diagnosis not present

## 2023-09-24 DIAGNOSIS — J479 Bronchiectasis, uncomplicated: Secondary | ICD-10-CM | POA: Diagnosis not present

## 2023-09-24 DIAGNOSIS — I130 Hypertensive heart and chronic kidney disease with heart failure and stage 1 through stage 4 chronic kidney disease, or unspecified chronic kidney disease: Secondary | ICD-10-CM | POA: Diagnosis present

## 2023-09-24 DIAGNOSIS — J09X2 Influenza due to identified novel influenza A virus with other respiratory manifestations: Secondary | ICD-10-CM | POA: Diagnosis not present

## 2023-09-24 DIAGNOSIS — M4854XA Collapsed vertebra, not elsewhere classified, thoracic region, initial encounter for fracture: Secondary | ICD-10-CM | POA: Diagnosis present

## 2023-09-24 DIAGNOSIS — I5033 Acute on chronic diastolic (congestive) heart failure: Secondary | ICD-10-CM | POA: Diagnosis not present

## 2023-09-24 DIAGNOSIS — F32A Depression, unspecified: Secondary | ICD-10-CM | POA: Diagnosis present

## 2023-09-24 DIAGNOSIS — Z7901 Long term (current) use of anticoagulants: Secondary | ICD-10-CM | POA: Diagnosis not present

## 2023-09-24 DIAGNOSIS — J1001 Influenza due to other identified influenza virus with the same other identified influenza virus pneumonia: Secondary | ICD-10-CM | POA: Diagnosis not present

## 2023-09-24 DIAGNOSIS — Z79890 Hormone replacement therapy (postmenopausal): Secondary | ICD-10-CM | POA: Diagnosis not present

## 2023-09-24 DIAGNOSIS — N179 Acute kidney failure, unspecified: Secondary | ICD-10-CM | POA: Diagnosis not present

## 2023-09-24 DIAGNOSIS — Z66 Do not resuscitate: Secondary | ICD-10-CM | POA: Diagnosis not present

## 2023-09-24 MED ORDER — DM-GUAIFENESIN ER 30-600 MG PO TB12: 1.0000 | ORAL_TABLET | Freq: Two times a day (BID) | ORAL | 0 refills | Status: DC | PRN

## 2023-09-24 MED ORDER — ACETAMINOPHEN 325 MG PO TABS: 650.0000 mg | ORAL_TABLET | Freq: Four times a day (QID) | ORAL | Status: DC | PRN

## 2023-09-24 MED ORDER — PREDNISONE 20 MG PO TABS: ORAL_TABLET | ORAL | 0 refills | Status: DC

## 2023-09-24 MED ORDER — PANTOPRAZOLE SODIUM 40 MG PO TBEC: 40.0000 mg | DELAYED_RELEASE_TABLET | Freq: Every day | ORAL | 1 refills | Status: DC

## 2023-09-24 NOTE — NC FL2 (Signed)
[place] MEDICAID [facility] LEVEL OF CARE FORM     IDENTIFICATION  Patient Name: Lauren Baker Birthdate: 04-Dec-1940 Sex: female Admission Date (Current Location): 09/21/2023  [facility] [place] and [place] Number:  [employer] and Address:  [facility],  [address] [place] [address]      Provider Number: [id number]  Attending Physician Name and Address:  Vassie Loll, MD  Relative Name and Phone Number:       Current Level of Care: Hospital Recommended Level of Care: Skilled Nursing Facility Prior Approval Number:    Date Approved/Denied:   PASRR Number: [id number]  Discharge Plan: SNF    Current Diagnoses: Patient Active Problem List   Diagnosis Date Noted   Respiratory failure with hypoxia (HCC) 09/21/2023   Pacemaker 03/04/2022   Metabolic acidosis 10/19/2018   Bradycardia 10/18/2018   Acute respiratory failure with hypoxia (HCC)    Shock, cardiogenic (HCC)    Accidental overdose    Chronic atrial fibrillation (HCC) 03/12/2018   Essential hypertension 03/12/2018    Orientation RESPIRATION BLADDER Height & Weight     Self, Time, Situation, Place  O2 (4L) Continent Weight: 167 lb 5.3 oz (75.9 kg) Height:  5\' 8"  (172.7 cm)  BEHAVIORAL SYMPTOMS/MOOD NEUROLOGICAL BOWEL NUTRITION STATUS      Continent    AMBULATORY STATUS COMMUNICATION OF NEEDS Skin   Extensive Assist Verbally Normal                       Personal Care Assistance Level of Assistance  Bathing, Feeding, Dressing   Feeding assistance: Independent Dressing Assistance: Limited assistance     Functional Limitations Info  Sight, Speech, Hearing Sight Info: Adequate Hearing Info: Adequate Speech Info: Adequate    SPECIAL CARE FACTORS FREQUENCY  PT (By licensed PT), OT (By licensed OT)     PT Frequency: 5 times weekly OT Frequency: 5 times weekly            Contractures Contractures Info: Not present    Additional Factors Info  Code Status, Allergies  Code Status Info: FULL Allergies Info: NKA           Current Medications (09/24/2023):  This is the current hospital active medication list Current Facility-Administered Medications  Medication Dose Route Frequency Provider Last Rate Last Admin   acetaminophen (TYLENOL) tablet 650 mg  650 mg Oral Q6H PRN Gillis Santa, MD       Or   acetaminophen (TYLENOL) suppository 650 mg  650 mg Rectal Q6H PRN Gillis Santa, MD       albuterol (PROVENTIL) (2.5 MG/3ML) 0.083% nebulizer solution 2.5 mg  2.5 mg Nebulization Q2H PRN Emokpae, Courage, MD       apixaban (ELIQUIS) tablet 5 mg  5 mg Oral BID Vassie Loll, MD   5 mg at 09/24/23 0911   bisacodyl (DULCOLAX) EC tablet 10 mg  10 mg Oral QHS Gillis Santa, MD   10 mg at 09/22/23 2137   bisacodyl (DULCOLAX) suppository 10 mg  10 mg Rectal Daily PRN Gillis Santa, MD       buPROPion [facility]) tablet 100 mg  100 mg Oral Once Derwood Kaplan, MD       buPROPion [facility]) tablet 100 mg  100 mg Oral TID Gillis Santa, MD   100 mg at 09/23/23 2127   clonazePAM (KLONOPIN) tablet 0.5 mg  0.5 mg Oral BID Gillis Santa, MD   0.5 mg at 09/24/23 0911   dextromethorphan-guaiFENesin (MUCINEX DM) 30-600  MG per 12 hr tablet 1 tablet  1 tablet Oral BID Shon Hale, MD   1 tablet at 09/24/23 0910   diltiazem (CARDIZEM CD) 24 hr capsule 300 mg  300 mg Oral Daily Gillis Santa, MD   300 mg at 09/24/23 0911   DULoxetine (CYMBALTA) DR capsule 30 mg  30 mg Oral Daily Gillis Santa, MD   30 mg at 09/24/23 0910   ipratropium-albuterol (DUONEB) 0.5-2.5 (3) MG/3ML nebulizer solution 3 mL  3 mL Nebulization TID Shon Hale, MD   3 mL at 09/24/23 0805   levothyroxine (SYNTHROID) tablet 25 mcg  25 mcg Oral Daily Gillis Santa, MD   25 mcg at 09/24/23 0647   losartan (COZAAR) tablet 50 mg  50 mg Oral Daily Gillis Santa, MD   50 mg at 09/24/23 0911   methylPREDNISolone sodium succinate (SOLU-MEDROL) 40 mg/mL injection 40 mg  40 mg Intravenous Q12H Emokpae, Courage,  MD   40 mg at 09/24/23 0910   metoprolol succinate (TOPROL-XL) 24 hr tablet 200 mg  200 mg Oral Daily Gillis Santa, MD   200 mg at 09/24/23 0912   ondansetron (ZOFRAN) tablet 4 mg  4 mg Oral Q6H PRN Gillis Santa, MD       Or   ondansetron (ZOFRAN) injection 4 mg  4 mg Intravenous Q6H PRN Gillis Santa, MD       Oral care mouth rinse  15 mL Mouth Rinse PRN Emokpae, Courage, MD       polyethylene glycol (MIRALAX / GLYCOLAX) packet 17 g  17 g Oral BID Gillis Santa, MD   17 g at 09/23/23 1005   QUEtiapine (SEROQUEL) tablet 50 mg  50 mg Oral QHS Gillis Santa, MD   50 mg at 09/23/23 2127   sodium chloride flush (NS) 0.9 % injection 3 mL  3 mL Intravenous Q12H Gillis Santa, MD   3 mL at 09/24/23 0914   sodium chloride flush (NS) 0.9 % injection 3 mL  3 mL Intravenous Q12H Gillis Santa, MD   3 mL at 09/24/23 0914   sodium chloride flush (NS) 0.9 % injection 3 mL  3 mL Intravenous PRN Gillis Santa, MD         Discharge Medications: Please see discharge summary for a list of discharge medications.  Relevant Imaging Results:  Relevant Lab Results:   Additional Information SSN: [id number] [place]

## 2023-09-24 NOTE — Progress Notes (Signed)
SATURATION QUALIFICATIONS: (This note is used to comply with regulatory documentation for home oxygen)  Patient Saturations on Room Air at Rest = 87%  Patient Saturations on Room Air while Ambulating = 82%  Patient Saturations on 4 Liters of oxygen while Ambulating = 92%  Please briefly explain why patient needs home oxygen:

## 2023-09-24 NOTE — TOC Progression Note (Addendum)
Transition of Care [facility]) - Progression Note    Patient Details  Name: Lauren Baker MRN: [id number] Date of Birth: [birth year]  Transition of Care [facility] CM/SW Contact  Villa Herb, [place] Phone Number: 09/24/2023, 11:16 AM  Clinical Narrative:    CSW spoke with pt and family at bedside about plans for going home with [facility] and home O2. Pt and family concerned about pts ability to mobilizing safely in the home. CSW explained that SNF referral can be sent out and we can see who has a bed to offer. Pt and family agreeable to this. CSW to complete and send out referral. TOC to follow.   Addendum 3:20 pm: Pt agreeable to accepting bed at [facility]. CSW spoke with Lynnea Ferrier at [facility] who states once Berkley Harvey is approved they can accept. CSW confirmed insurance Berkley Harvey is pending at this time. TOC to follow.   Expected Discharge Plan: Home w Home Health Services Barriers to Discharge: Continued Medical Work up  Expected Discharge Plan and Services In-house Referral: Clinical Social Work Discharge Planning Services: CM Consult Post Acute Care Choice: Home Health Living arrangements for the past 2 months: Single Family Home                                       Social Determinants of Health (SDOH) Interventions SDOH Screenings   Food Insecurity: No Food Insecurity (09/22/2023)  Housing: Low Risk  (09/22/2023)  Transportation Needs: No Transportation Needs (09/22/2023)  Utilities: Not At Risk (09/22/2023)  Social Connections: Moderately Integrated (09/22/2023)  Tobacco Use: Low Risk  (09/21/2023)    Readmission Risk Interventions    09/23/2023    1:12 PM  Readmission Risk Prevention Plan  Medication Screening Complete  Transportation Screening Complete

## 2023-09-24 NOTE — Progress Notes (Signed)
Mobility Specialist Progress Note:    09/24/23 1025  Mobility  Activity Transferred from bed to chair;Stood at bedside  Level of Assistance Minimal assist, patient does 75% or more  Assistive Device Front wheel walker  Distance Ambulated (ft) 3 ft  Range of Motion/Exercises Active;All extremities  Activity Response Tolerated well  Mobility Referral Yes  Mobility visit 1 Mobility  Mobility Specialist Start Time (ACUTE ONLY) 1000  Mobility Specialist Stop Time (ACUTE ONLY) 1025  Mobility Specialist Time Calculation (min) (ACUTE ONLY) 25 min   Pt received in bed, family at bedside. Agreeable to mobility, required MinA to stand and transfer with RW. Tolerated well, SpO2 85% on 3L after standing. Sat pt in chair, SpO2 92% on 3L after transfer. Alarm on, call bell in hand. All needs met.   Lawerance Bach Mobility Specialist Please contact via Special educational needs teacher or  Rehab office at [phone]

## 2023-09-24 NOTE — Plan of Care (Signed)

## 2023-09-24 NOTE — TOC Transition Note (Signed)
Transition of Care [facility]) - Discharge Note   Patient Details  Name: Lauren Baker MRN: [id number] Date of Birth: [birth year]  Transition [facility]) CM/SW Contact:  Villa Herb, LCSWA Phone Number: 09/24/2023, 3:48 PM   Clinical Narrative:    CSW updated that pts insurance auth has been approved for SNF at [facility]. CSW spoke to [place] with [facility] who states they are able to take pt today. CSW updated MD, D/C is being completed at this time. CSW to provide RN with room and report numbers. Lynnea Ferrier will call and provide updates to pts family. TOC signing off.   Final next level of care: Skilled Nursing Facility Barriers to Discharge: Barriers Resolved   Patient Goals and CMS Choice Patient states their goals for this hospitalization and ongoing recovery are:: go to SNF at [facility] CMS Medicare.gov Compare Post Acute Care list provided to:: Patient Choice offered to / list presented to : Patient [place] ownership interest in [facility].provided to:: Patient    Discharge Placement                Patient to be transferred to facility by: facility staff Name of family member notified: family Patient and family notified of of transfer: 09/24/23  Discharge Plan and Services Additional resources added to the After Visit Summary for   In-house Referral: Clinical Social Work Discharge Planning Services: CM Consult Post Acute Care Choice: Home Health                               Social Drivers of Health (SDOH) Interventions SDOH Screenings   Food Insecurity: No Food Insecurity (09/22/2023)  Housing: Low Risk  (09/22/2023)  Transportation Needs: No Transportation Needs (09/22/2023)  Utilities: Not At Risk (09/22/2023)  Social Connections: Moderately Integrated (09/22/2023)  Tobacco Use: Low Risk  (09/21/2023)     Readmission Risk Interventions    09/23/2023    1:12 PM  Readmission Risk Prevention Plan  Medication Screening Complete  Transportation Screening  Complete

## 2023-09-24 NOTE — Discharge Summary (Signed)
Physician Discharge Summary   Patient: Lauren Baker MRN: [id number] DOB: Oct 29, 1940  Admit date:     09/21/2023  Discharge date: 09/24/23  Discharge Physician: Vassie Loll   PCP: Elfredia Nevins, MD   Recommendations at discharge:  Repeat basic metabolic panel to follow ultralights renal function Reassess blood pressure and adjust antihypertensive regimen as needed Repeat chest x-ray in 6-8 weeks to assess resolution of pneumonitis/alveolitis process.  Discharge Diagnoses: Principal Problem:   Respiratory failure with hypoxia [facility]) Active Problems:   Chronic atrial fibrillation (HCC)   Primary hypertension   Pacemaker   Gastroesophageal reflux disease  Brief Hospital admission Course: As per H&P written by Dr. Lucianne Muss on 09/21/2023 Lauren Baker is a 83 y.o. female with Past medical history of chronic permanent A-fib, s/p pacemaker on DOAC, hypertension, hypothyroidism, depression, as reviewed from EMR presented at [facility] ED with complaining of weakness and decreased p.o. intake for 2 to 3 weeks, gradually getting worse and not back to her baseline.  EMS was called and patient was found to have hypoxic respiratory failure 88% on room air.  Patient was placed on 4 L oxygen and was brought into the ED.     ED Course: Afebrile, HR 111 A-fib with RVR, RR 24, BP 118/90, 88% on room air BMP Na 134, K2.9, glucose 170, creatinine 1.24  D-dimer 0.98, CTA chest is pending, less likely PE in view of anticoagulation, patient is on Eliquis at troponin x 2 negative CBC within normal range Negative COVID, RSV and flu RVP pending CXR: Chronic appearing interstitial changes. No acute cardiopulmonary findings. Mild cardiomegaly. CTA chest: Negative for PE, multiple bilateral groundglass opacities, most likely multifocal atypical pneumonia such as viral etiology?  Assessment and Plan: 1)Acute Respiratory failure with hypoxia- multifocal pneumonia, atypical, possible viral infection -CTA  chest w/o Pulm Embolism  -Multifocal bilateral ground-glass airspace disease superimposed upon chronic interstitial scarring. Favor multifocal atypical pneumonia such as viral etiology, though atypical edema or inflammatory alveolitis could be considered in the appropriate setting. -PCT < 0.10 WBC 6.9 >>4.6 Strep Pneumo Neg Covid, Flu and RSV Neg, 20 path Viral Resp Panel neg -Discharge with instructions for steroids tapering. -Patient completed 4 days of empiric antibiotic therapy in-house. -Continue symptomatic treatment with DuoNeb as needed, Mucinex   -Continue incentive spirometry Unable to wean off oxygen ---currently on 4L/min oxygen  -Will recommend Outpatient follow-up with pulmonologist based on further clinical response and down the road need for oxygen supplementation.   # Hypokalemia- Normalized after replacement # Hypomagnesemia-- -Normalized after replacement # Hyponatremia, mild,  Monitor electrolytes and replete as needed.   # Chronic A-fib with RVR, s/p PPM, ventricular paced rhythm -Echocardiogram from 09/22/2023 with preserved EF of 55 to 60%, no aortic stenosis, no mitral stenosis, left and right atrium severely dilated, no significant change from prior echo from March 2020 -Resumed Eliquis for stroke prevention,  -c/n Cardizem CD and Toprol-XL for rate control -Continue patient follow-up with cardiology service.   # Hypertension-c/n losartan 50 mg p.o. daily -Cardizem and metoprolol as above -Monitor BP and titrate medication accordingly -Continue to monitor on telemetry   # Gallstones-incidental finding on CT abdomen right upper quadrant ultrasound with gallstones without acute cholecystitis -Clinically Murphy sign is negative, no leukocytosis no emesis, no abdominal pain Patient denies any abdominal pain, asymptomatic -Outpatient follow-up with general surgery if needed.   # Hypothyroid, continued Synthroid.   # Depression: Continue Klonopin, Wellbutrin,  Cymbalta and Seroquel  Continue supportive care and constant orientation.   -  Generalized weakness and deconditioning--- physical therapy eval appreciated; recommendation for skilled nursing facility. -Patient initially considering going home with home health services; after discussing with her sons they were able to convince her to pursued short-term rehab at discharge. -Patient will go to a skilled nursing facility.  Consultants: None Procedures performed: See below for x-ray reports. Disposition: Skilled nursing facility Diet recommendation: Heart healthy/low-sodium diet.  DISCHARGE MEDICATION: Allergies as of 09/24/2023   No Known Allergies      Medication List     TAKE these medications    acetaminophen 325 MG tablet Commonly known as: TYLENOL Take 2 tablets (650 mg total) by mouth every 6 (six) hours as needed for mild pain (pain score 1-3) (or Fever >/= 101).   buPROPion 100 MG tablet Commonly known as: WELLBUTRIN Take 100 mg by mouth 3 (three) times daily.   cholecalciferol 1000 units tablet Commonly known as: VITAMIN D Take 1,000 Units by mouth daily.   clonazePAM 0.5 MG tablet Commonly known as: KLONOPIN Take 0.5 mg by mouth 2 (two) times daily.   dextromethorphan-guaiFENesin 30-600 MG 12hr tablet Commonly known as: MUCINEX DM Take 1 tablet by mouth 2 (two) times daily as needed for cough.   diltiazem 300 MG 24 hr capsule Commonly known as: CARDIZEM CD Take 1 capsule (300 mg total) by mouth daily.   DULoxetine 30 MG capsule Commonly known as: CYMBALTA Take 30 mg by mouth daily.   Eliquis 5 MG Tabs tablet Generic drug: apixaban TAKE 1 TABLET BY MOUTH TWICE DAILY.   levothyroxine 25 MCG tablet Commonly known as: SYNTHROID Take 25 mcg by mouth daily.   losartan 50 MG tablet Commonly known as: COZAAR Take 1 tablet (50 mg total) by mouth daily.   metoprolol 200 MG 24 hr tablet Commonly known as: TOPROL-XL Take 1 tablet (200 mg total) by mouth  daily.   pantoprazole 40 MG tablet Commonly known as: Protonix Take 1 tablet (40 mg total) by mouth daily.   predniSONE 20 MG tablet Commonly known as: DELTASONE Take 3 tablets by mouth daily x 1 day; then 2 tablets by mouth daily x 2 days; then 1 tablet by mouth daily x 3 days; then half tablet by mouth daily x 3 days and stop prednisone.   QUEtiapine 50 MG tablet Commonly known as: SEROQUEL Take 50 mg by mouth at bedtime.               Durable Medical Equipment  (From admission, onward)           Start     Ordered   09/23/23 1942  For home use only DME oxygen  Once       Question Answer Comment  Length of Need 12 Months   Mode or (Route) Nasal cannula   Liters per Minute 4   Frequency Continuous (stationary and portable oxygen unit needed)   Oxygen conserving device Yes   Oxygen delivery system Gas      09/23/23 1941            Follow-up Information     [facility] Follow up.   Specialty: Home Health Services Why: Agency will call to set up home visits. Contact information: [address] [place] [address] [phone]         Elfredia Nevins, MD. Schedule an appointment as soon as possible for a visit in 2 week(s).   Specialty: Internal Medicine Why: After discharge from the skilled nursing facility. Contact information: [address] [place] [address] [phone]                Discharge Exam: Ceasar Mons Weights   09/21/23 1120 09/22/23 0908  Weight: 81.6 kg 75.9 kg   General exam: Alert, awake, oriented x 3; reports feeling better today.  No fever, no nausea or vomiting. Respiratory system: 4L nasal cannula supplementation in place; no using accessory muscle.  Positive rhonchi bilaterally. Cardiovascular system: Rate controlled, no rubs, no gallops, no JVD. Gastrointestinal system: Abdomen is nondistended, soft and nontender. No organomegaly or masses felt. Normal bowel sounds heard. Central  nervous system: Generally weak.  No focal neurological deficits. Extremities: No cyanosis or clubbing. Skin: No petechiae. Psychiatry: Mood & affect appropriate.   Condition at discharge: stable improved.  The results of significant diagnostics from this hospitalization (including imaging, microbiology, ancillary and laboratory) are listed below for reference.   Imaging Studies: ECHOCARDIOGRAM COMPLETE Result Date: 09/22/2023    ECHOCARDIOGRAM REPORT   Patient Name:   Lauren Baker Date of Exam: 09/22/2023 Medical Rec #:  [id number]         Height:       68.0 in Accession #:    [id number]        Weight:       167.3 lb Date of Birth:  Dec 25, 1940         BSA:          1.895 m Patient Age:    82 years          BP:           108/76 mmHg Patient Gender: F                 HR:           99 bpm. [facility] Procedure: 2D Echo, Cardiac Doppler and Color Doppler Indications:    A Fib  History:        Patient has prior history of Echocardiogram examinations, most                 recent 10/19/2018. Arrythmias:Atrial Fibrillation; Risk                 Factors:Hypertension.  Sonographer:    Amy Chionchio Referring Phys: [id number] Gillis Santa IMPRESSIONS  1. Left ventricular ejection fraction, by estimation, is 55 to 60%. The left ventricle has normal function. Left ventricular endocardial border not optimally defined to evaluate regional wall motion. Indeterminate diastolic filling due to E-A fusion.  2. Right ventricular systolic function is normal. The right ventricular size is mildly enlarged.  3. Left atrial size was severely dilated.  4. Right atrial size was severely dilated.  5. The mitral valve is normal in structure. Mild mitral valve regurgitation. No evidence of mitral stenosis.  6. The aortic valve is tricuspid. Aortic valve regurgitation is trivial. No aortic stenosis is present. Comparison(s): No significant change from prior study. FINDINGS  Left Ventricle: Left ventricular ejection  fraction, by estimation, is 55 to 60%. The left ventricle has normal function. Left ventricular endocardial border not optimally defined to evaluate regional wall motion. The left ventricular internal cavity size was normal in size. There is no left ventricular hypertrophy. Left ventricular diastolic function could not be evaluated due to atrial fibrillation. Indeterminate diastolic filling due to E-A fusion. Right Ventricle: The right ventricular size is mildly enlarged. No increase in right ventricular wall thickness. Right ventricular systolic function is normal. Left Atrium: Left atrial size  was severely dilated. Right Atrium: Right atrial size was severely dilated. Prominent Eustachian valve. Pericardium: Trivial pericardial effusion is present. Mitral Valve: The mitral valve is normal in structure. Mild mitral valve regurgitation. No evidence of mitral valve stenosis. MV peak gradient, 5.7 mmHg. The mean mitral valve gradient is 2.0 mmHg. Tricuspid Valve: The tricuspid valve is normal in structure. Tricuspid valve regurgitation is mild . No evidence of tricuspid stenosis. Aortic Valve: The aortic valve is tricuspid. Aortic valve regurgitation is trivial. No aortic stenosis is present. Aortic valve mean gradient measures 2.0 mmHg. Aortic valve peak gradient measures 3.9 mmHg. Aortic valve area, by VTI measures 3.63 cm. Pulmonic Valve: The pulmonic valve was normal in structure. Pulmonic valve regurgitation is mild. No evidence of pulmonic stenosis. Aorta: The aortic root and ascending aorta are structurally normal, with no evidence of dilitation. Venous: The inferior vena cava was not well visualized. IAS/Shunts: The interatrial septum was not assessed.  LEFT VENTRICLE PLAX 2D LVIDd:         3.20 cm LVIDs:         2.10 cm LV PW:         1.00 cm LV IVS:        1.00 cm LVOT diam:     2.00 cm LV SV:         54 LV SV Index:   29 LVOT Area:     3.14 cm  RIGHT VENTRICLE RV Basal diam:  4.70 cm RV Mid diam:    3.50  cm TAPSE (M-mode): 1.5 cm LEFT ATRIUM              Index        RIGHT ATRIUM           Index LA Vol (A2C):   106.0 ml 55.95 ml/m  RA Area:     31.10 cm LA Vol (A4C):   137.0 ml 72.31 ml/m  RA Volume:   119.00 ml 62.81 ml/m LA Biplane Vol: 121.0 ml 63.87 ml/m  AORTIC VALVE                    PULMONIC VALVE AV Area (Vmax):    3.01 cm     PV Vmax:          0.99 m/s AV Area (Vmean):   3.30 cm     PV Peak grad:     3.9 mmHg AV Area (VTI):     3.63 cm     PR End Diast Vel: 10.11 msec AV Vmax:           99.10 cm/s AV Vmean:          69.000 cm/s AV VTI:            0.149 m AV Peak Grad:      3.9 mmHg AV Mean Grad:      2.0 mmHg LVOT Vmax:         95.00 cm/s LVOT Vmean:        72.500 cm/s LVOT VTI:          0.172 m LVOT/AV VTI ratio: 1.15  AORTA Ao Root diam: 2.80 cm Ao Asc diam:  3.40 cm MITRAL VALVE             TRICUSPID VALVE MV Area VTI:  2.90 cm   TR Peak grad:   34.3 mmHg MV Peak grad: 5.7 mmHg   TR Vmax:        293.00 cm/s MV Mean grad:  2.0 mmHg MV Vmax:      1.19 m/s   SHUNTS MV Vmean:     70.6 cm/s  Systemic VTI:  0.17 m                          Systemic Diam: 2.00 cm Vishnu Priya Mallipeddi Electronically signed by Winfield Rast Mallipeddi Signature Date/Time: 09/22/2023/1:06:40 PM    Final    US Abdomen Limited RUQ (LIVER/GB) Result Date: 09/22/2023 CLINICAL DATA:  Cholelithiasis on recent CT examination EXAM: ULTRASOUND ABDOMEN LIMITED RIGHT UPPER QUADRANT COMPARISON:  09/21/2023 FINDINGS: Gallbladder: Gallbladder is well distended with gallbladder sludge and the single large 2.9 cm stone similar to that noted on prior CT examination. No pericholecystic fluid is noted. Negative sonographic Murphy's sign is elicited. Common bile duct: Diameter: 2.7 mm. Liver: Increased echogenicity consistent with fatty infiltration. Portal vein is patent on color Doppler imaging with normal direction of blood flow towards the liver. Other: None. IMPRESSION: Cholelithiasis and gallbladder sludge. Fatty liver.  Electronically Signed   By: Alcide Clever M.D.   On: 09/22/2023 10:24   CT Angio Chest PE W and/or Wo Contrast Result Date: 09/21/2023 CLINICAL DATA:  Weakness and nausea for 1 week, tachycardia, positive D-dimer EXAM: CT ANGIOGRAPHY CHEST WITH CONTRAST TECHNIQUE: Multidetector CT imaging of the chest was performed using the standard protocol during bolus administration of intravenous contrast. Multiplanar CT image reconstructions and MIPs were obtained to evaluate the vascular anatomy. RADIATION DOSE REDUCTION: This exam was performed according to the departmental dose-optimization program which includes automated exposure control, adjustment of the mA and/or kV according to patient size and/or use of iterative reconstruction technique. CONTRAST:  60mL OMNIPAQUE IOHEXOL 350 MG/ML SOLN COMPARISON:  09/21/2023 FINDINGS: Cardiovascular: This is a technically adequate evaluation of the pulmonary vasculature. No filling defects or pulmonary emboli. Mild cardiomegaly, with prominent biatrial dilatation. Single lead pacer identified, lead within the right ventricle. No evidence of thoracic aortic aneurysm or dissection. Atherosclerosis of the aorta and coronary vasculature. Mediastinum/Nodes: No enlarged mediastinal, hilar, or axillary lymph nodes. Thyroid gland, trachea, and esophagus demonstrate no significant findings. Small hiatal hernia. Lungs/Pleura: There are scattered bilateral areas of ground-glass airspace disease, favor multifocal atypical pneumonia such as viral etiology. Background of basilar predominant subpleural scarring. No effusion or pneumothorax. Central airways are patent. Upper Abdomen: Gallbladder is distended, with high attenuation material in the bladder lumen possibly representing vicarious excretion of previously administered contrast. If no recent contrasted procedure, this could reflect underlying sludge. Lower attenuation filling defects are seen within the gallbladder neck and within the  gallbladder fundus, consistent with noncalcified gallstones. The gallstone at the gallbladder neck may be impacted. No CT findings of cholecystitis, though the gallbladder is incompletely imaged due to slice selection. The remainder of the upper abdomen is unremarkable. Musculoskeletal: There are age indeterminate but chronic appearing compression deformities at T2 and T8 with resulting vertebra plana. Likely chronic compression deformity superior endplate of the T12 vertebral body, with less than 10% loss of height. No evidence of acute fracture. Reconstructed images demonstrate no additional findings. Review of the MIP images confirms the above findings. IMPRESSION: 1. No evidence of pulmonary embolus. 2. Multifocal bilateral ground-glass airspace disease superimposed upon chronic interstitial scarring. I would favor multifocal atypical pneumonia such as viral etiology, though atypical edema or inflammatory alveolitis could be considered in the appropriate setting. 3. Noncalcified gallstones, with possible impacted stone at the gallbladder neck. High attenuation material filling the gallbladder lumen may  reflect vicarious excretion of previous contrast or sludge. No CT evidence of acute cholecystitis. If gallbladder pathology is suspected, right upper quadrant ultrasound may be useful. 4. Cardiomegaly, with prominent biatrial dilatation. 5. Aortic Atherosclerosis (ICD10-I70.0). Coronary artery atherosclerosis. Electronically Signed   By: Sharlet Salina M.D.   On: 09/21/2023 16:48   DG Chest Port 1 View Result Date: 09/21/2023 CLINICAL DATA:  Shortness of breath. EXAM: PORTABLE CHEST 1 VIEW COMPARISON:  Chest radiograph dated October 26, 2018. FINDINGS: The heart size is mildly enlarged. The mediastinal contours are within normal limits. Stable left chest wall AICD in place. Diffuse bilateral chronic appearing interstitial markings. Mild left basilar atelectasis/scarring. No focal consolidation, sizeable pleural  effusion, or pneumothorax. No acute osseous abnormality. IMPRESSION: 1. Chronic appearing interstitial changes. No acute cardiopulmonary findings. 2. Mild cardiomegaly. Electronically Signed   By: Hart Robinsons M.D.   On: 09/21/2023 13:54    Microbiology: Results for orders placed or performed during the hospital encounter of 09/21/23  Resp panel by RT-PCR (RSV, Flu A&B, Covid) Anterior Nasal Swab     Status: None   Collection Time: 09/21/23 12:05 PM   Specimen: Anterior Nasal Swab  Result Value Ref Range Status   SARS Coronavirus 2 by RT PCR NEGATIVE NEGATIVE Final    Comment: (NOTE) SARS-CoV-2 target nucleic acids are NOT DETECTED.  The SARS-CoV-2 RNA is generally detectable in upper respiratory specimens during the acute phase of infection. The lowest concentration of SARS-CoV-2 viral copies this assay can detect is 138 copies/mL. A negative result does not preclude SARS-Cov-2 infection and should not be used as the sole basis for treatment or other patient management decisions. A negative result may occur with  improper specimen collection/handling, submission of specimen other than nasopharyngeal swab, presence of viral mutation(s) within the areas targeted by this assay, and inadequate number of viral copies(<138 copies/mL). A negative result must be combined with clinical observations, patient history, and epidemiological information. The expected result is Negative.  Fact Sheet for Patients:  BloggerCourse.com  Fact Sheet for Healthcare Providers:  SeriousBroker.it  This test is no t yet approved or cleared by the [place] FDA and  has been authorized for detection and/or diagnosis of SARS-CoV-2 by FDA under an Emergency Use Authorization (EUA). This EUA will remain  in effect (meaning this test can be used) for the duration of the COVID-19 declaration under Section 564(b)(1) of the Act, 21 U.S.C.section  360bbb-3(b)(1), unless the authorization is terminated  or revoked sooner.       Influenza A by PCR NEGATIVE NEGATIVE Final   Influenza B by PCR NEGATIVE NEGATIVE Final    Comment: (NOTE) The Xpert Xpress SARS-CoV-2/FLU/RSV plus assay is intended as an aid in the diagnosis of influenza from Nasopharyngeal swab specimens and should not be used as a sole basis for treatment. Nasal washings and aspirates are unacceptable for Xpert Xpress SARS-CoV-2/FLU/RSV testing.  Fact Sheet for Patients: BloggerCourse.com  Fact Sheet for Healthcare Providers: SeriousBroker.it  This test is not yet approved or cleared by the [place] FDA and has been authorized for detection and/or diagnosis of SARS-CoV-2 by FDA under an Emergency Use Authorization (EUA). This EUA will remain in effect (meaning this test can be used) for the duration of the COVID-19 declaration under Section 564(b)(1) of the Act, 21 U.S.C. section 360bbb-3(b)(1), unless the authorization is terminated or revoked.     Resp Syncytial Virus by PCR NEGATIVE NEGATIVE Final    Comment: (NOTE) Fact Sheet for Patients: BloggerCourse.com  Fact  Sheet for Healthcare Providers: SeriousBroker.it  This test is not yet approved or cleared by the [place] and has been authorized for detection and/or diagnosis of SARS-CoV-2 by FDA under an Emergency Use Authorization (EUA). This EUA will remain in effect (meaning this test can be used) for the duration of the COVID-19 declaration under Section 564(b)(1) of the Act, 21 U.S.C. section 360bbb-3(b)(1), unless the authorization is terminated or revoked.  Performed at [facility], [address], [place], [place] [address]   Respiratory (~20 pathogens) panel by PCR     Status: None   Collection Time: 09/21/23  6:20 PM   Specimen: Nasopharyngeal Swab; Respiratory  Result Value Ref  Range Status   Adenovirus NOT DETECTED NOT DETECTED Final   Coronavirus 229E NOT DETECTED NOT DETECTED Final    Comment: (NOTE) The Coronavirus on the Respiratory Panel, DOES NOT test for the novel  Coronavirus (2019 nCoV)    Coronavirus HKU1 NOT DETECTED NOT DETECTED Final   Coronavirus NL63 NOT DETECTED NOT DETECTED Final   Coronavirus OC43 NOT DETECTED NOT DETECTED Final   Metapneumovirus NOT DETECTED NOT DETECTED Final   Rhinovirus / Enterovirus NOT DETECTED NOT DETECTED Final   Influenza A NOT DETECTED NOT DETECTED Final   Influenza B NOT DETECTED NOT DETECTED Final   Parainfluenza Virus 1 NOT DETECTED NOT DETECTED Final   Parainfluenza Virus 2 NOT DETECTED NOT DETECTED Final   Parainfluenza Virus 3 NOT DETECTED NOT DETECTED Final   Parainfluenza Virus 4 NOT DETECTED NOT DETECTED Final   Respiratory Syncytial Virus NOT DETECTED NOT DETECTED Final   Bordetella pertussis NOT DETECTED NOT DETECTED Final   Bordetella Parapertussis NOT DETECTED NOT DETECTED Final   Chlamydophila pneumoniae NOT DETECTED NOT DETECTED Final   Mycoplasma pneumoniae NOT DETECTED NOT DETECTED Final    Comment: Performed at [facility], [address], [place], [place] [address]    Labs: CBC: Recent Labs  Lab 09/21/23 1244 09/22/23 0509  WBC 6.9 4.6  NEUTROABS 5.1  --   HGB 12.8 12.3  HCT 37.5 36.7  MCV 91.0 92.4  PLT 272 230   Basic Metabolic Panel: Recent Labs  Lab 09/21/23 1244 09/21/23 1438 09/22/23 0509 09/23/23 0503  NA 134*  --  132* 132*  K 2.9*  --  4.0 4.1  CL 95*  --  98 99  CO2 28  --  25 24  GLUCOSE 170*  --  170* 187*  BUN 16  --  19 24*  CREATININE 1.24*  --  1.58* 1.44*  CALCIUM 8.7*  --  8.9 9.0  MG  --  1.3* 2.0  --   PHOS  --  2.5 3.2  --    Liver Function Tests: Recent Labs  Lab 09/21/23 1438 09/22/23 0509 09/23/23 0503  AST 27 22 25   ALT 20 18 20   ALKPHOS 90 89 89  BILITOT 3.4* 2.5* 1.1  PROT 6.6 6.2* 6.1*  ALBUMIN 2.6* 2.4* 2.4*   CBG: No  results for input(s): "GLUCAP" in the last 168 hours.  Discharge time spent: greater than 30 minutes.  Signed: Vassie Loll, MD [facility] 09/24/2023

## 2023-09-24 NOTE — Progress Notes (Signed)
   09/24/23 0911  Assess: MEWS Score  BP 124/77  Pulse Rate (!) 113  Assess: MEWS Score  MEWS Temp 0  MEWS Systolic 0  MEWS Pulse 2  MEWS RR 0  MEWS LOC 0  MEWS Score 2  MEWS Score Color Yellow  Assess: if the MEWS score is Yellow or Red  Were vital signs accurate and taken at a resting state? Yes  Does the patient meet 2 or more of the SIRS criteria? No  MEWS guidelines implemented  Yes, yellow  Treat  MEWS Interventions Considered administering scheduled or prn medications/treatments as ordered  Take Vital Signs  Increase Vital Sign Frequency  Yellow: Q2hr x1, continue Q4hrs until patient remains green for 12hrs  Escalate  MEWS: Escalate Yellow: Discuss with charge nurse and consider notifying provider and/or RRT  Notify: Charge Nurse/RN  Name of Charge Nurse/RN Notified Janan Ridge  Provider Notification  Provider Name/Title [facility]  Date Provider Notified 09/24/23  Time Provider Notified [phone]  Assess: SIRS CRITERIA  SIRS Temperature  0  SIRS Respirations  0  SIRS Pulse 1  SIRS WBC 0  SIRS Score Sum  1

## 2023-09-25 ENCOUNTER — Ambulatory Visit: Payer: Medicare HMO | Admitting: Cardiology

## 2023-09-28 ENCOUNTER — Encounter: Payer: Self-pay | Admitting: Adult Health

## 2023-09-28 ENCOUNTER — Non-Acute Institutional Stay (SKILLED_NURSING_FACILITY): Payer: Self-pay | Admitting: Adult Health

## 2023-09-28 DIAGNOSIS — I10 Essential (primary) hypertension: Secondary | ICD-10-CM

## 2023-09-28 DIAGNOSIS — F333 Major depressive disorder, recurrent, severe with psychotic symptoms: Secondary | ICD-10-CM

## 2023-09-28 DIAGNOSIS — I700 Atherosclerosis of aorta: Secondary | ICD-10-CM

## 2023-09-28 DIAGNOSIS — I482 Chronic atrial fibrillation, unspecified: Secondary | ICD-10-CM

## 2023-09-28 DIAGNOSIS — K219 Gastro-esophageal reflux disease without esophagitis: Secondary | ICD-10-CM

## 2023-09-28 DIAGNOSIS — E039 Hypothyroidism, unspecified: Secondary | ICD-10-CM

## 2023-09-28 DIAGNOSIS — J9601 Acute respiratory failure with hypoxia: Secondary | ICD-10-CM

## 2023-09-28 NOTE — Progress Notes (Unsigned)
Location:  [facility] Room Number: 134 Place of Service:  SNF (31)   CODE STATUS: full   No Known Allergies  Chief Complaint  Patient presents with   Hospitalization Follow-up    HPI:  She is a 83 year old woman who has been hospitalized from 09-21-23 through 09-24-23. Her past medical history includes: atrial fibrillation s/p pacemaker; hypertension; hypothyroidism; depression. She presented to the ED with a 2-3 history of weakness and poor po intake. She was found to be in acute respiratory failure with hypoxia her sat was 88%; was placed on 02  Acute respiratory failure with hypoxia-multifocal pneumonia, atypical possible viral infection: she was ruled out for PE; multifocal bilateral ground glass airspace disease superimposed upon chronic interstitial scarring.  She is here for short term rehab with her goal to return back home. She will continue to be followed for her chronic illnesses including: Chronic atrial fibrillation:  Primary hypertension: Acquired hypothyroidism  Past Medical History:  Diagnosis Date   Chronic atrial fibrillation (HCC)    Hypertension     Past Surgical History:  Procedure Laterality Date   detatched retina     PACEMAKER IMPLANT N/A 10/25/2018   Procedure: PACEMAKER IMPLANT;  Surgeon: Hillis Range, MD;  Location: [facility] INVASIVE CV LAB;  Service: Cardiovascular;  Laterality: N/A;    Social History   Socioeconomic History   Marital status: Widowed    Spouse name: Not on file   Number of children: Not on file   Years of education: Not on file   Highest education level: Not on file  Occupational History   Not on file  Tobacco Use   Smoking status: Never   Smokeless tobacco: Never  Vaping Use   Vaping status: Never Used  Substance and Sexual Activity   Alcohol use: No   Drug use: No   Sexual activity: Not on file  Other Topics Concern   Not on file  Social History Narrative   Not on file   Social Drivers of Health    Financial Resource Strain: Not on file  Food Insecurity: No Food Insecurity (09/22/2023)   Hunger Vital Sign    Worried About Running Out of Food in the Last Year: Never true    Ran Out of Food in the Last Year: Never true  Transportation Needs: No Transportation Needs (09/22/2023)   PRAPARE - Administrator, Civil Service (Medical): No    Lack of Transportation (Non-Medical): No  Physical Activity: Not on file  Stress: Not on file  Social Connections: Moderately Integrated (09/22/2023)   Social Connection and Isolation Panel [NHANES]    Frequency of Communication with Friends and Family: More than three times a week    Frequency of Social Gatherings with Friends and Family: More than three times a week    Attends Religious Services: More than 4 times per year    Active Member of [employer] or Organizations: Yes    Attends Banker Meetings: More than 4 times per year    Marital Status: Widowed  Intimate Partner Violence: Not At Risk (09/22/2023)   Humiliation, Afraid, Rape, and Kick questionnaire    Fear of Current or Ex-Partner: No    Emotionally Abused: No    Physically Abused: No    Sexually Abused: No   Family History  Problem Relation Age of Onset   CVA Mother    Hypertension Sister    Heart attack Brother    Heart attack  Sister    Cancer Sister       VITAL SIGNS BP 107/75   Pulse 94   Temp (!) 97 F (36.1 C)   Resp 20   Ht 5\' 9"  (1.753 m)   Wt 176 lb 6.4 oz (80 kg)   SpO2 95%   BMI 26.05 kg/m   Outpatient Encounter Medications as of 09/28/2023  Medication Sig   omeprazole (PRILOSEC) 40 MG capsule Take 40 mg by mouth daily.   acetaminophen (TYLENOL) 325 MG tablet Take 2 tablets (650 mg total) by mouth every 6 (six) hours as needed for mild pain (pain score 1-3) (or Fever >/= 101).   apixaban (ELIQUIS) 5 MG TABS tablet TAKE 1 TABLET BY MOUTH TWICE DAILY.   buPROPion (WELLBUTRIN) 100 MG tablet Take 100 mg by mouth 3 (three) times daily.    cholecalciferol (VITAMIN D) 1000 units tablet Take 1,000 Units by mouth daily.   clonazePAM (KLONOPIN) 0.5 MG tablet Take 0.5 mg by mouth 2 (two) times daily.   dextromethorphan-guaiFENesin (MUCINEX DM) 30-600 MG 12hr tablet Take 1 tablet by mouth 2 (two) times daily as needed for cough.   diltiazem (CARDIZEM CD) 300 MG 24 hr capsule Take 1 capsule (300 mg total) by mouth daily.   DULoxetine (CYMBALTA) 30 MG capsule Take 30 mg by mouth daily.    levothyroxine (SYNTHROID) 25 MCG tablet Take 25 mcg by mouth daily.   losartan (COZAAR) 50 MG tablet Take 1 tablet (50 mg total) by mouth daily.   metoprolol (TOPROL-XL) 200 MG 24 hr tablet Take 1 tablet (200 mg total) by mouth daily.   predniSONE (DELTASONE) 20 MG tablet Take 3 tablets by mouth daily x 1 day; then 2 tablets by mouth daily x 2 days; then 1 tablet by mouth daily x 3 days; then half tablet by mouth daily x 3 days and stop prednisone.   QUEtiapine (SEROQUEL) 50 MG tablet Take 50 mg by mouth at bedtime.    [DISCONTINUED] pantoprazole (PROTONIX) 40 MG tablet Take 1 tablet (40 mg total) by mouth daily.   No facility-administered encounter medications on file as of 09/28/2023.     SIGNIFICANT DIAGNOSTIC EXAMS  TODAY  09-21-23: wbc 6.9; hgb 12.8; hct 37.5; mcv 91.0 plt 272; glucose 170; bun 16; creat 1.24; k+ 2.9; na++ 134; ca 8.7; gfr 44; protein 6.6; albumin 2.6; mag 1.3; tsh 1.629 09-23-23: glucose 187; bun 24; creat 1.44; k+ 4.1; na++ 132; ca 9.0; gfr 37; protein 6.1 albumin 2.4   Review of Systems  Constitutional:  Negative for malaise/fatigue.  Respiratory:  Negative for cough and shortness of breath.   Cardiovascular:  Negative for chest pain, palpitations and leg swelling.  Gastrointestinal:  Negative for abdominal pain, constipation and heartburn.  Musculoskeletal:  Negative for back pain, joint pain and myalgias.  Skin: Negative.   Neurological:  Negative for dizziness.  Psychiatric/Behavioral:  The patient is not  nervous/anxious.    Physical Exam Constitutional:      General: She is not in acute distress.    Appearance: She is well-developed. She is not diaphoretic.  Neck:     Thyroid: No thyromegaly.  Cardiovascular:     Rate and Rhythm: Normal rate and regular rhythm.     Pulses: Normal pulses.     Heart sounds: Normal heart sounds.  Pulmonary:     Effort: Pulmonary effort is normal. No respiratory distress.     Comments: Crackles  Abdominal:     General: Bowel sounds are normal. There  is no distension.     Palpations: Abdomen is soft.     Tenderness: There is no abdominal tenderness.  Musculoskeletal:        General: Normal range of motion.     Cervical back: Neck supple.     Right lower leg: No edema.     Left lower leg: No edema.  Lymphadenopathy:     Cervical: No cervical adenopathy.  Skin:    General: Skin is warm and dry.  Neurological:     Mental Status: She is alert. Mental status is at baseline.  Psychiatric:        Mood and Affect: Mood normal.     ASSESSMENT/ PLAN:  TODAY  Acute respiratory failure with hypoxia: multifocal pneumonia most likely viral: will complete prednisone taper; will continue mucinex DM twice daily as needed.   2. Chronic atrial fibrillation: is status post pacemaker; heart rate is stable. Will continue eliquis 5 mg twice daily; will continue cardizem cd 300 mg daily; toprol xl 200 mg daily for rate control.   3. Primary hypertension: b/p 107/75: will continue cozaar 50 mg daily; toprol xl 200 mg daily   4. Acquired hypothyroidism: tsh 1.629 will continue synthroid 25 mcg daily   5. Gastroesophageal reflux disease without esophagitis: will continue prilosec 40 mg daily   6. Depression, major; recurrent with psychotic features: will continue wellblutrin 100 mg three times daily; klonopin 0.5 mg twice daily; cymbalta 30 mg daily; seroquel 50 mg nightly   7. Aortic atherosclerosis: (ct 09-21-23)  Will check bmp; mag    Synthia Innocent  NP [facility]   call [phone]

## 2023-09-29 DIAGNOSIS — F333 Major depressive disorder, recurrent, severe with psychotic symptoms: Secondary | ICD-10-CM | POA: Insufficient documentation

## 2023-09-29 DIAGNOSIS — E039 Hypothyroidism, unspecified: Secondary | ICD-10-CM | POA: Insufficient documentation

## 2023-09-29 DIAGNOSIS — I700 Atherosclerosis of aorta: Secondary | ICD-10-CM | POA: Insufficient documentation

## 2023-09-30 ENCOUNTER — Encounter: Payer: Self-pay | Admitting: Internal Medicine

## 2023-09-30 ENCOUNTER — Non-Acute Institutional Stay (SKILLED_NURSING_FACILITY): Payer: Self-pay | Admitting: Internal Medicine

## 2023-09-30 DIAGNOSIS — F333 Major depressive disorder, recurrent, severe with psychotic symptoms: Secondary | ICD-10-CM

## 2023-09-30 DIAGNOSIS — E039 Hypothyroidism, unspecified: Secondary | ICD-10-CM

## 2023-09-30 DIAGNOSIS — E441 Mild protein-calorie malnutrition: Secondary | ICD-10-CM

## 2023-09-30 DIAGNOSIS — E46 Unspecified protein-calorie malnutrition: Secondary | ICD-10-CM | POA: Insufficient documentation

## 2023-09-30 DIAGNOSIS — I10 Essential (primary) hypertension: Secondary | ICD-10-CM

## 2023-09-30 DIAGNOSIS — I482 Chronic atrial fibrillation, unspecified: Secondary | ICD-10-CM

## 2023-09-30 DIAGNOSIS — J9601 Acute respiratory failure with hypoxia: Secondary | ICD-10-CM

## 2023-09-30 NOTE — Assessment & Plan Note (Signed)
Rate adequately controlled Continue Eliquis

## 2023-09-30 NOTE — Assessment & Plan Note (Signed)
 BP controlled; no change in antihypertensive medications

## 2023-09-30 NOTE — Assessment & Plan Note (Signed)
Current TSH is therapeutic at 1.629.  nO change indicated in L thyroxine dose despite chronic A-fib.

## 2023-09-30 NOTE — Progress Notes (Unsigned)
NURSING HOME LOCATION:  [facility] ROOM NUMBER:  [id number] P  CODE STATUS:  Full Code  PCP:  Elfredia Nevins MD  This is a comprehensive admission note to this SNFperformed on this date less than 30 days from date of admission. Included are preadmission medical/surgical history; reconciled medication list; family history; social history and comprehensive review of systems.  Corrections and additions to the records were documented. Comprehensive physical exam was also performed. Additionally a clinical summary was entered for each active diagnosis pertinent to this admission in the Problem List to enhance continuity of care.  HPI: She was hospitalized 2/10 - 09/24/2023 with respiratory failure with hypoxia in the setting of chronic atrial fibrillation, presenting with weakness with decreased oral intake for 2-3 weeks PTA.  EMS was called she was found to have O2 sats of 88% on room air.  4 L nasal oxygen was applied and she was transported to the ED.  In the ED she was in A-fib with heart rate of 111; respiratory rate was 24.  Potassium was 2.9 and creatinine 1.24.  Respiratory panel was negative.  Chest x-ray revealed chronic interstitial changes.  CTA of the chest was negative for PTE.  She has multiple bilateral groundglass opacities suggesting possible multifocal atypical pneumonia possibly viral in etiology versus atypical edema or inflammatory alveolitis.  White count remains normal; PCT was less than 0.10.  She received empiric antibiotic antibiotics with pulmonary toilet position and DuoNeb as needed.  She cannot be weaned off the oxygen was discharged on 4 L of nasal oxygen.  Pulmonology consult as an outpatient was to be considered. Hypokalemia and hypomagnesemia were repleted.  Mild hyponatremia was present with sodium ranging from 1 32-1 34.  Initial creatinine was 1.24 with a peak creatinine 1.58 and final value of 1.44.  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  Protein/caloric malnutrition was suggested by total protein 6.1 and albumin 2.4.  Despite diagnosis of chronic atrial fibrillation; TSH was therapeutic at 1.629. Echo 2/11 revealed EF of 55 to 60% with severe left and right atrial dilation.  Findings were essentially unchanged from echo of March 2020. Incidental finding on CT of abdomen with gallstones without acute cholecystitis. PT/OT consulted and recommended SNF placement for rehab.  Past medical and surgical history includes AF , HTN, GERD, hypothyroidism, and history of depression. Procedures include pacemaker implantation. Family history: reviewed, non contributory due to advanced age.  Social history: Non-smoker, nondrinker.   Review of systems: She validates that she was hospitalized because of pneumonia and "I had gotten really weak."  She states that her breathing is okay.  Her non-smoking status was verified.  She denies any history of pulmonary disease.  Review of systems was otherwise negative.  Constitutional: No fever, significant weight change, fatigue  Eyes: No redness, discharge, pain, vision change ENT/mouth: No nasal congestion, purulent discharge, earache, change in hearing, sore throat  Cardiovascular: No chest pain, palpitations, paroxysmal nocturnal dyspnea, claudication, edema  Respiratory: No cough, sputum production, hemoptysis, DOE, significant snoring, apnea Gastrointestinal: No heartburn, dysphagia, abdominal pain, nausea /vomiting, rectal bleeding, melena, change in bowels Genitourinary: No dysuria, hematuria, pyuria, incontinence, nocturia Musculoskeletal: No joint stiffness, joint swelling, weakness, pain Dermatologic: No rash, pruritus, change in appearance of skin Neurologic: No dizziness, headache, syncope, seizures, numbness, tingling Psychiatric: No significant anxiety, depression, insomnia, anorexia Endocrine: No change in hair/skin/nails, excessive thirst,  excessive hunger, excessive urination  Hematologic/lymphatic: No significant bruising, lymphadenopathy, abnormal bleeding Allergy/immunology: No itchy/watery eyes, significant sneezing, urticaria, angioedema  Physical exam:  Pertinent or positive findings: There is intermittent exotropia of the right eye.  Ptosis is greater on the left than the right.  She is wearing nasal oxygen.  She is essentially edentulous except for erosions and residual nubbins mandibular teeth.  Heart sounds are distant; rhythm is irregular.  Breath sounds are generally decreased although she does have scattered low-grade rales at the bases.  Abdomen is protuberant.  Pedal pulses are not palpable.  She has 1+ edema of the lower extremities.  Subtle clubbing of the nailbeds is suggested.  Interosseous wasting is noted.  There is a varus deformity of the knees. General appearance: Adequately nourished; no acute distress, increased work of breathing is present.   Lymphatic: No lymphadenopathy about the head, neck, axilla. Eyes: No conjunctival inflammation or lid edema is present. There is no scleral icterus. Ears:  External ear exam shows no significant lesions or deformities.   Nose:  External nasal examination shows no deformity or inflammation. Nasal mucosa are pink and moist without lesions, exudates Oral exam: Lips and gums are healthy appearing.There is no oropharyngeal erythema or exudate. Neck:  No thyromegaly, masses, tenderness noted.    Heart:  Normal rate and regular rhythm. S1 and S2 normal without gallop, murmur, click, rub.  Lungs: Chest clear to auscultation without wheezes, rhonchi, rales, rubs. Abdomen: Bowel sounds are normal.  Abdomen is soft and nontender with no organomegaly, hernias, masses. GU: Deferred  Extremities:  No cyanosis, clubbing, edema. Neurologic exam:  Strength equal  in upper & lower extremities. Balance, Rhomberg, finger to nose testing could not be completed due to clinical state Deep  tendon reflexes are equal Skin: Warm & dry w/o tenting. No significant lesions or rash.  See clinical summary under each active problem in the Problem List with associated updated therapeutic plan

## 2023-09-30 NOTE — Patient Instructions (Signed)
 See assessment and plan under each diagnosis in the problem list and acutely for this visit

## 2023-09-30 NOTE — Assessment & Plan Note (Signed)
Total protein 6.1 and albumin 2.4.  Nutritionist to follow at [facility].

## 2023-10-01 ENCOUNTER — Ambulatory Visit: Payer: Medicare HMO | Admitting: Cardiology

## 2023-10-01 NOTE — Assessment & Plan Note (Signed)
Clinically stable on complex psychotropic regimen without evidence of psychosis. F/U with prescriber post D/C from SNF to reassess.

## 2023-10-01 NOTE — Assessment & Plan Note (Signed)
I personally reviewed the CXR & CT images. The multifocal ground glass changes are clinically significant & associated with ongoing hypoxic respiratory failure preventing O2 wean. I verified non smoking history & absence of prior history of pulmonary disease process. I discussed these results with her & emphasized Pulmonary consultation essential if hypoxia & changes persist on F/U imaging.

## 2023-10-06 IMAGING — DX DG WRIST COMPLETE 3+V*L*
3 series · 3 of 3 positions shown · non-contrast
Comparison: None Available.

CLINICAL DATA: Fall last night with left wrist pain.

EXAM:
LEFT WRIST - COMPLETE 3+ VIEW

[wrist ap]
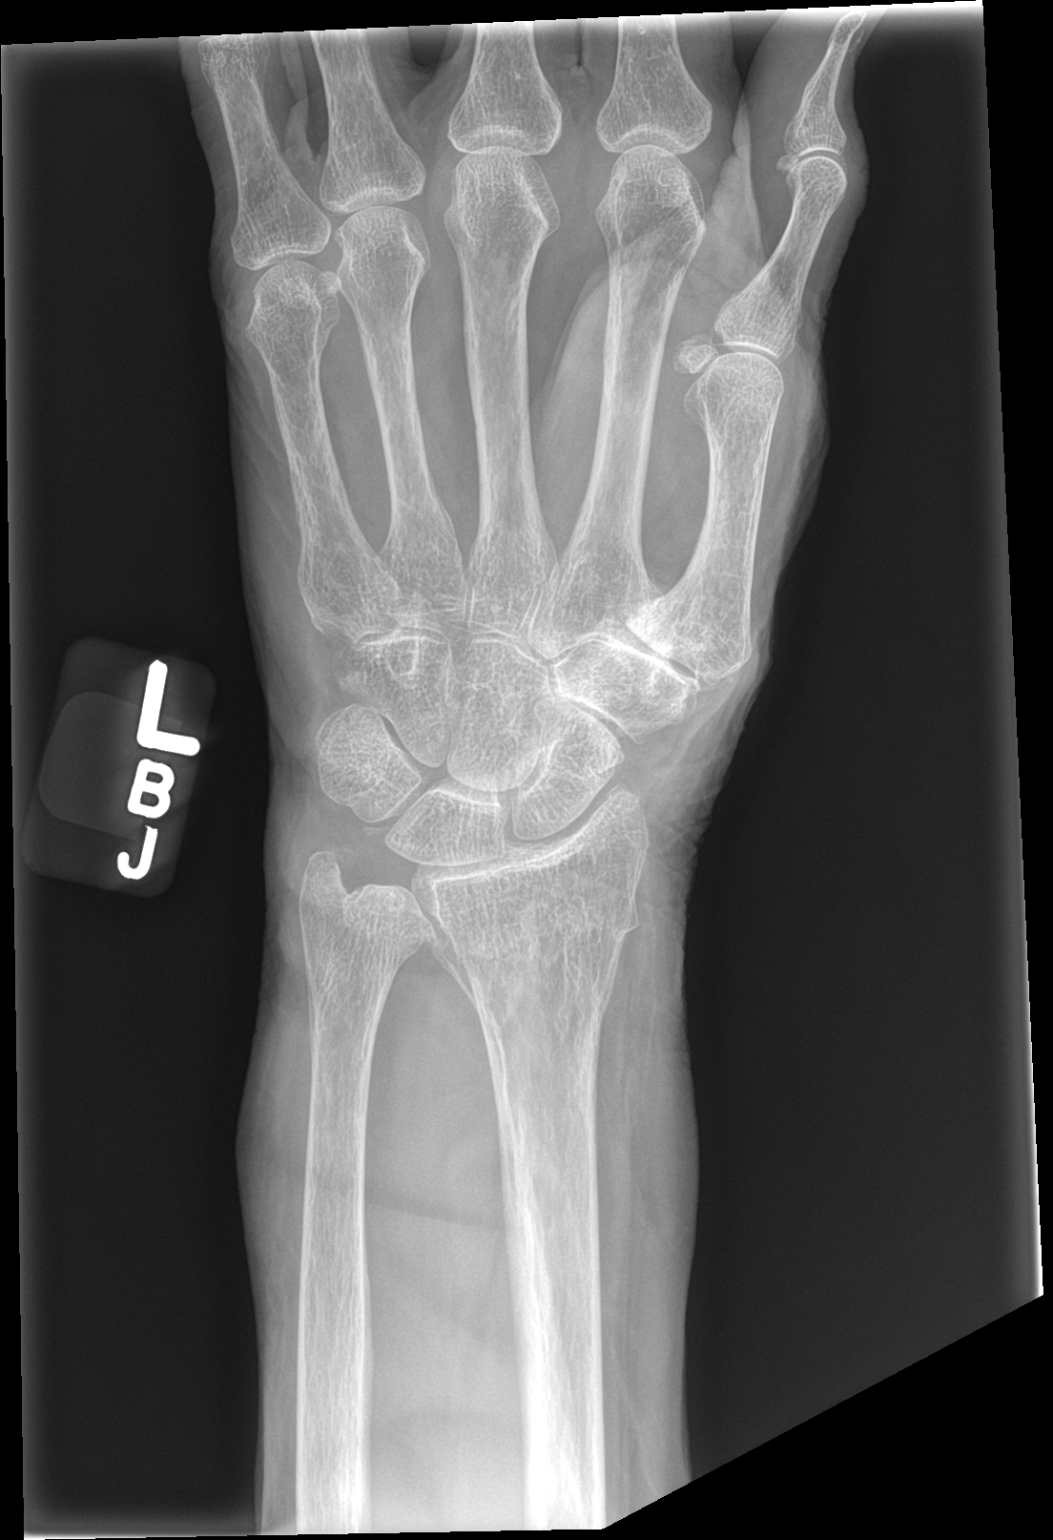

[wrist obl]
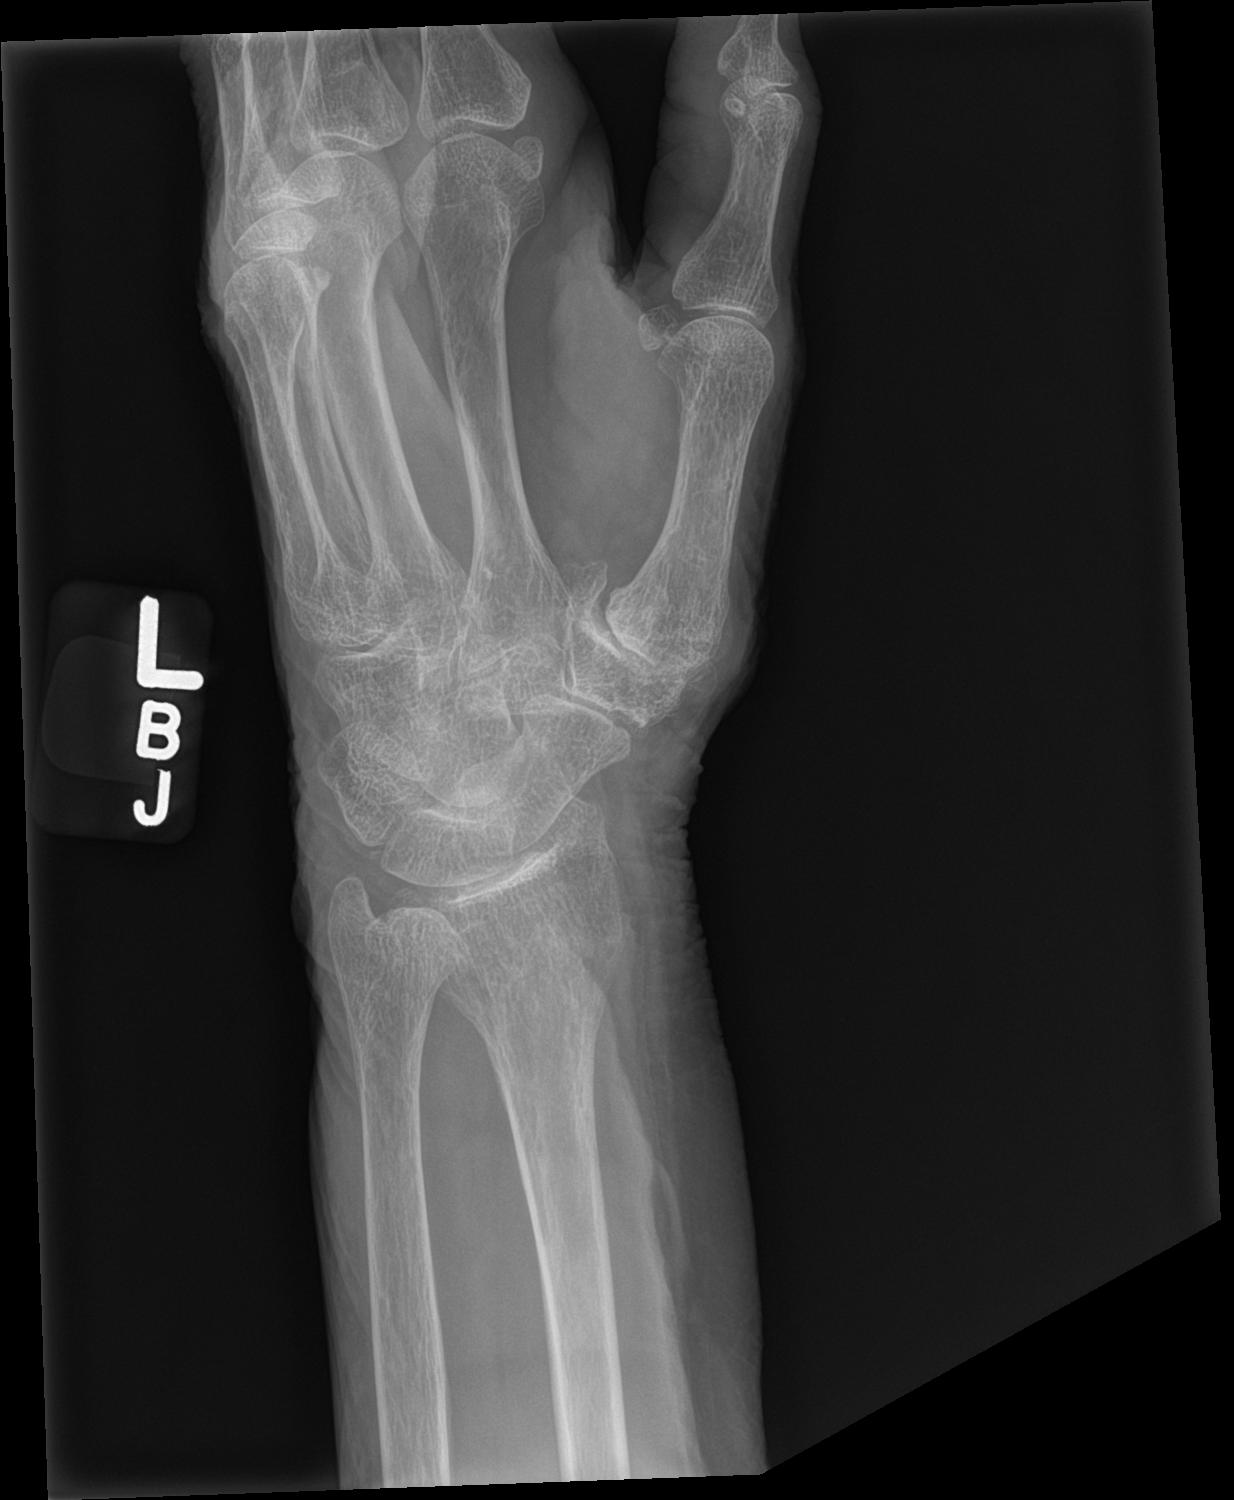

[wrist lat]
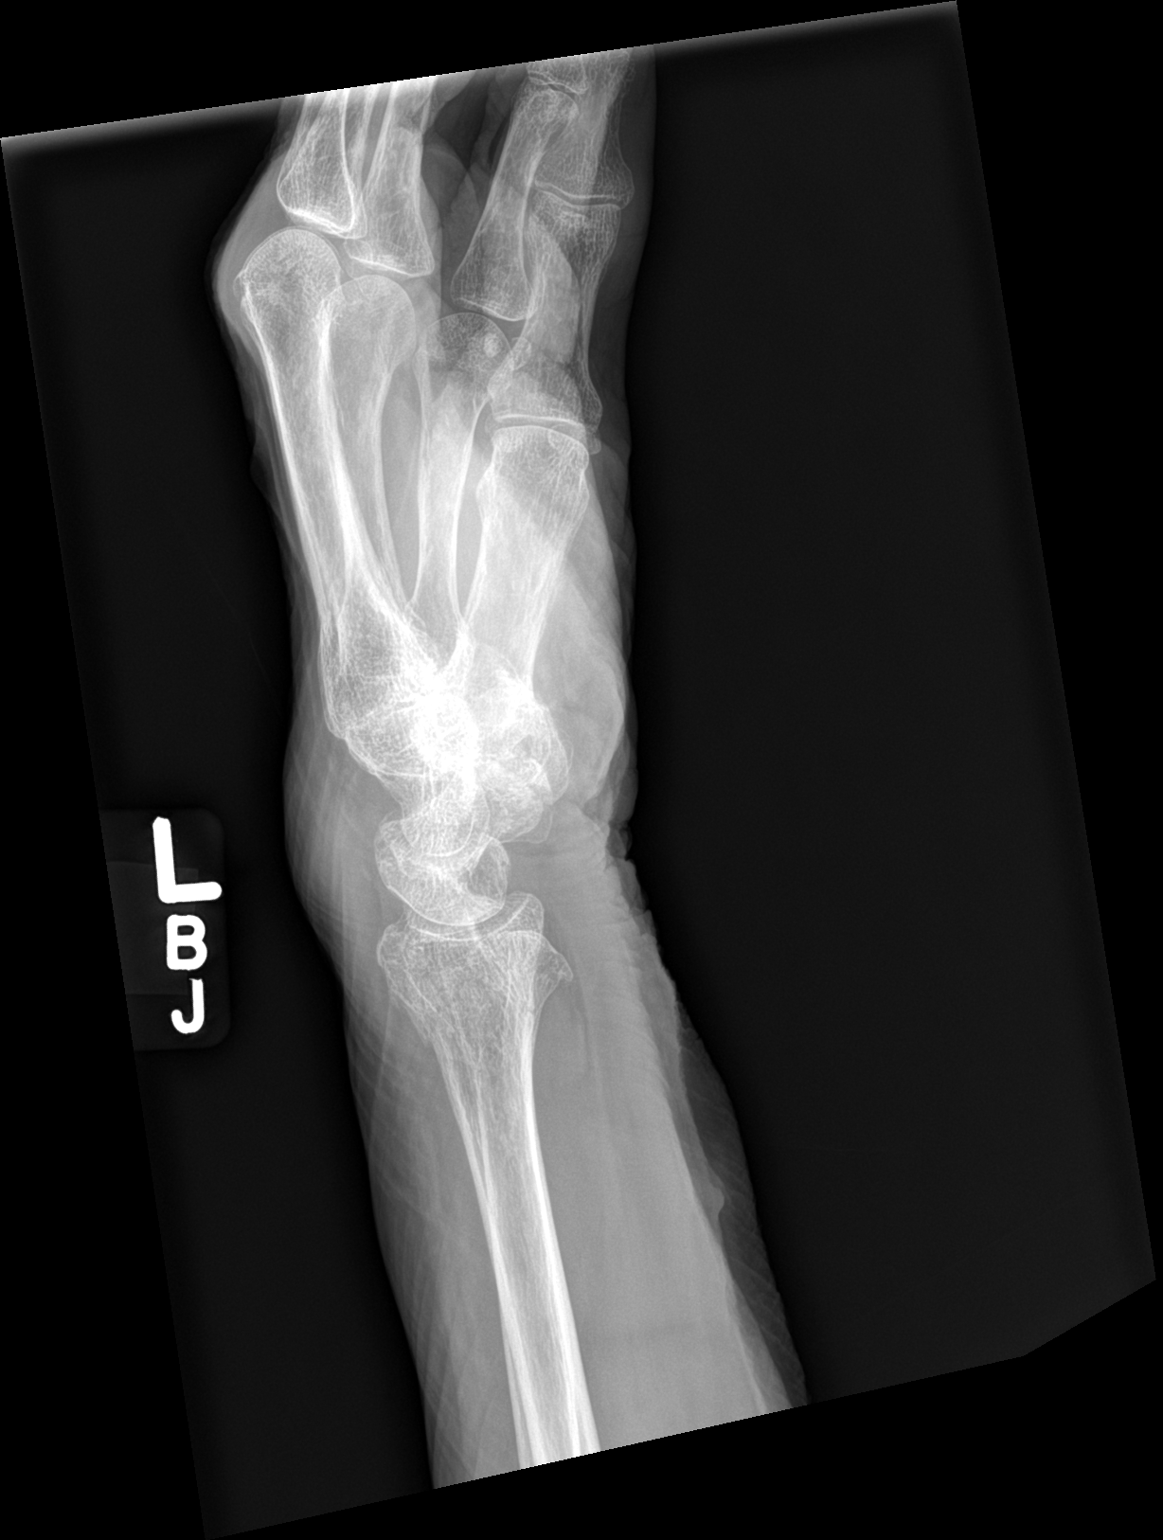

[3 of 3 positions shown; findings below may reference images not displayed]

FINDINGS: Diffuse decreased bone mineralization. Mild degenerate change of the
distal radioulnar joint as well as the radiocarpal joint.
Degenerative changes over the radial side of the carpal bones as
well as first carpometacarpal joint. Minimally displaced transverse
fracture of the distal radial metaphysis. Remainder the exam is
unremarkable.
IMPRESSION: Minimally displaced transverse fracture of the distal radial
metaphysis.

## 2023-10-08 ENCOUNTER — Non-Acute Institutional Stay (SKILLED_NURSING_FACILITY): Payer: Self-pay | Admitting: Adult Health

## 2023-10-08 ENCOUNTER — Encounter: Payer: Self-pay | Admitting: Adult Health

## 2023-10-08 ENCOUNTER — Other Ambulatory Visit (HOSPITAL_COMMUNITY)
Admission: RE | Admit: 2023-10-08 | Discharge: 2023-10-08 | Disposition: A | Discharge status: Home / Self Care | Payer: Medicare HMO | Source: Skilled Nursing Facility | Attending: Adult Health | Admitting: Adult Health

## 2023-10-08 DIAGNOSIS — I482 Chronic atrial fibrillation, unspecified: Secondary | ICD-10-CM

## 2023-10-08 DIAGNOSIS — I10 Essential (primary) hypertension: Secondary | ICD-10-CM | POA: Insufficient documentation

## 2023-10-08 DIAGNOSIS — I700 Atherosclerosis of aorta: Secondary | ICD-10-CM

## 2023-10-08 DIAGNOSIS — F333 Major depressive disorder, recurrent, severe with psychotic symptoms: Secondary | ICD-10-CM

## 2023-10-08 LAB — BASIC METABOLIC PANEL
Anion gap: 4 — ABNORMAL LOW (ref 5–15)
BUN: 15 mg/dL (ref 8–23)
CO2: 32 mmol/L (ref 22–32)
Calcium: 8.9 mg/dL (ref 8.9–10.3)
Chloride: 104 mmol/L (ref 98–111)
Creatinine, Ser: 1.13 mg/dL — ABNORMAL HIGH (ref 0.44–1.00)
GFR, Estimated: 49 mL/min — ABNORMAL LOW (ref >60)
Glucose, Bld: 88 mg/dL (ref 70–99)
Potassium: 3.3 mmol/L — ABNORMAL LOW (ref 3.5–5.1)
Sodium: 140 mmol/L (ref 135–145)

## 2023-10-08 LAB — MAGNESIUM: Magnesium: 1.9 mg/dL (ref 1.7–2.4)

## 2023-10-08 NOTE — Progress Notes (Signed)
 Location:  [facility] Room Number: 134P Place of Service:  SNF (31) Mamadou Breon S,NP  CODE STATUS: Full  No Known Allergies  Chief Complaint  Patient presents with   Acute Visit    Care plan meeting    HPI:  We have come together for her care plan meeting. Family present BIMS 9/15 mood 2/30: nervous at times. Uses wheelchair without falls. Requires moderate to dependent assist with her adl care. She is occasionally incontinent of bladder and bowel. Dietary: regular diet setup with meals; appetite 26-50%; weight is 176.4 pounds. Therapy: ambulate 100 feet with rolling walker and contact guard does have knee weakness present on left side upper body supervision lower body min assist BRP contact guard/min assist; stairs bilateral hand rails with contact guard; BCAT 28/50; repeat 26/50. She will continue to be followed for her chronic illnesses including:  Chronic atrial fibrillation  Aortic atherosclerosis    Depression, major recurrent, severe with psychosis  Past Medical History:  Diagnosis Date   Chronic atrial fibrillation (HCC)    Hypertension     Past Surgical History:  Procedure Laterality Date   detatched retina     PACEMAKER IMPLANT N/A 10/25/2018   Procedure: PACEMAKER IMPLANT;  Surgeon: Hillis Range, MD;  Location: [facility];  Service: Cardiovascular;  Laterality: N/A;    Social History   Socioeconomic History   Marital status: Widowed    Spouse name: Not on file   Number of children: Not on file   Years of education: Not on file   Highest education level: Not on file  Occupational History   Not on file  Tobacco Use   Smoking status: Never   Smokeless tobacco: Never   Tobacco comments:    Has second hand exposure   Vaping Use   Vaping status: Never Used  Substance and Sexual Activity   Alcohol use: No   Drug use: No   Sexual activity: Not Currently  Other Topics Concern   Not on file  Social History Narrative   Not on  file   Social Drivers of Health   Financial Resource Strain: Not on file  Food Insecurity: No Food Insecurity (09/22/2023)   Hunger Vital Sign    Worried About Running Out of Food in the Last Year: Never true    Ran Out of Food in the Last Year: Never true  Transportation Needs: No Transportation Needs (09/22/2023)   PRAPARE - Administrator, Civil Service (Medical): No    Lack of Transportation (Non-Medical): No  Physical Activity: Not on file  Stress: Not on file  Social Connections: Moderately Integrated (09/22/2023)   Social Connection and Isolation Panel [NHANES]    Frequency of Communication with Friends and Family: More than three times a week    Frequency of Social Gatherings with Friends and Family: More than three times a week    Attends Religious Services: More than 4 times per year    Active Member of [employer] or Organizations: Yes    Attends Banker Meetings: More than 4 times per year    Marital Status: Widowed  Intimate Partner Violence: Not At Risk (09/22/2023)   Humiliation, Afraid, Rape, and Kick questionnaire    Fear of Current or Ex-Partner: No    Emotionally Abused: No    Physically Abused: No    Sexually Abused: No   Family History  Problem Relation Age of Onset   CVA Mother    Hypertension Sister  Heart attack Brother    Heart attack Sister    Cancer Sister       VITAL SIGNS BP 103/65   Pulse 74   Temp (!) 97.5 F (36.4 C)   Resp 18   Ht 5\' 9"  (1.753 m)   Wt 176 lb (79.8 kg)   SpO2 99%   BMI 25.99 kg/m   Outpatient Encounter Medications as of 10/08/2023  Medication Sig   apixaban (ELIQUIS) 5 MG TABS tablet TAKE 1 TABLET BY MOUTH TWICE DAILY.   buPROPion (WELLBUTRIN) 100 MG tablet Take 100 mg by mouth 3 (three) times daily.   cholecalciferol (VITAMIN D) 1000 units tablet Take 1,000 Units by mouth daily.   clonazePAM (KLONOPIN) 0.5 MG tablet Take 0.5 mg by mouth 2 (two) times daily.   dextromethorphan-guaiFENesin  (MUCINEX DM) 30-600 MG 12hr tablet Take 1 tablet by mouth 2 (two) times daily as needed for cough.   diltiazem (CARDIZEM CD) 300 MG 24 hr capsule Take 1 capsule (300 mg total) by mouth daily.   DULoxetine (CYMBALTA) 30 MG capsule Take 30 mg by mouth daily.    furosemide (LASIX) 20 MG tablet Take 20 mg by mouth daily.   levothyroxine (SYNTHROID) 25 MCG tablet Take 25 mcg by mouth daily.   losartan (COZAAR) 50 MG tablet Take 1 tablet (50 mg total) by mouth daily.   metoprolol (TOPROL-XL) 200 MG 24 hr tablet Take 1 tablet (200 mg total) by mouth daily.   omeprazole (PRILOSEC) 40 MG capsule Take 40 mg by mouth daily.   predniSONE (DELTASONE) 20 MG tablet Take 3 tablets by mouth daily x 1 day; then 2 tablets by mouth daily x 2 days; then 1 tablet by mouth daily x 3 days; then half tablet by mouth daily x 3 days and stop prednisone.   QUEtiapine (SEROQUEL) 50 MG tablet Take 50 mg by mouth at bedtime.    acetaminophen (TYLENOL) 325 MG tablet Take 2 tablets (650 mg total) by mouth every 6 (six) hours as needed for mild pain (pain score 1-3) (or Fever >/= 101). (Patient not taking: Reported on 10/08/2023)   No facility-administered encounter medications on file as of 10/08/2023.     SIGNIFICANT DIAGNOSTIC EXAMS  TODAY  09-21-23: wbc 6.9; hgb 12.8; hct 37.5; mcv 91.0 plt 272; glucose 170; bun 16; creat 1.24; k+ 2.9; na++ 134; ca 8.7; gfr 44; protein 6.6; albumin 2.6; mag 1.3; tsh 1.629 09-23-23: glucose 187; bun 24; creat 1.44; k+ 4.1; na++ 132; ca 9.0; gfr 37; protein 6.1 albumin 2.4   Review of Systems  Constitutional:  Negative for malaise/fatigue.  Respiratory:  Negative for cough and shortness of breath.   Cardiovascular:  Negative for chest pain, palpitations and leg swelling.  Gastrointestinal:  Negative for abdominal pain, constipation and heartburn.  Musculoskeletal:  Negative for back pain, joint pain and myalgias.  Skin: Negative.   Neurological:  Negative for dizziness.   Psychiatric/Behavioral:  The patient is not nervous/anxious.    Physical Exam Constitutional:      General: She is not in acute distress.    Appearance: She is well-developed. She is not diaphoretic.  Neck:     Thyroid: No thyromegaly.  Cardiovascular:     Rate and Rhythm: Normal rate and regular rhythm.     Heart sounds: Normal heart sounds.  Pulmonary:     Effort: Pulmonary effort is normal. No respiratory distress.     Breath sounds: Normal breath sounds.  Abdominal:     General: Bowel  sounds are normal. There is no distension.     Palpations: Abdomen is soft.     Tenderness: There is no abdominal tenderness.  Musculoskeletal:        General: Normal range of motion.     Cervical back: Neck supple.     Right lower leg: No edema.     Left lower leg: No edema.  Lymphadenopathy:     Cervical: No cervical adenopathy.  Skin:    General: Skin is warm and dry.  Neurological:     Mental Status: She is alert. Mental status is at baseline.  Psychiatric:        Mood and Affect: Mood normal.      ASSESSMENT/ PLAN:  TODAY  Chronic atrial fibrillation Aortic atherosclerosis Depression, major recurrent, severe with psychosis  Will continue current medications Will continue therapy as directed Will continue to monitor her status.  Goal of care: to return home; may need home 02 at this time. Her ct angio did demonstrate interstitial scarring present. She has been exposed second hand smoke   Time spent with patient: 40 minutes: medications; therapy; dietary    Synthia Innocent NP [facility]   call [phone]

## 2023-10-19 ENCOUNTER — Other Ambulatory Visit (HOSPITAL_COMMUNITY)
Admission: RE | Admit: 2023-10-19 | Discharge: 2023-10-19 | Disposition: A | Discharge status: Home / Self Care | Source: Skilled Nursing Facility | Attending: Adult Health | Admitting: Adult Health

## 2023-10-19 DIAGNOSIS — R918 Other nonspecific abnormal finding of lung field: Secondary | ICD-10-CM | POA: Diagnosis not present

## 2023-10-19 DIAGNOSIS — N1832 Chronic kidney disease, stage 3b: Secondary | ICD-10-CM | POA: Diagnosis present

## 2023-10-19 DIAGNOSIS — N179 Acute kidney failure, unspecified: Secondary | ICD-10-CM | POA: Diagnosis not present

## 2023-10-19 DIAGNOSIS — R0602 Shortness of breath: Secondary | ICD-10-CM | POA: Diagnosis not present

## 2023-10-19 DIAGNOSIS — J9621 Acute and chronic respiratory failure with hypoxia: Secondary | ICD-10-CM | POA: Diagnosis not present

## 2023-10-19 DIAGNOSIS — J90 Pleural effusion, not elsewhere classified: Secondary | ICD-10-CM | POA: Diagnosis not present

## 2023-10-19 DIAGNOSIS — J129 Viral pneumonia, unspecified: Secondary | ICD-10-CM | POA: Diagnosis present

## 2023-10-19 DIAGNOSIS — I5033 Acute on chronic diastolic (congestive) heart failure: Secondary | ICD-10-CM | POA: Diagnosis present

## 2023-10-19 DIAGNOSIS — J09X2 Influenza due to identified novel influenza A virus with other respiratory manifestations: Secondary | ICD-10-CM | POA: Diagnosis not present

## 2023-10-19 DIAGNOSIS — I5030 Unspecified diastolic (congestive) heart failure: Secondary | ICD-10-CM | POA: Diagnosis not present

## 2023-10-19 DIAGNOSIS — E876 Hypokalemia: Secondary | ICD-10-CM | POA: Diagnosis present

## 2023-10-19 DIAGNOSIS — I482 Chronic atrial fibrillation, unspecified: Secondary | ICD-10-CM | POA: Diagnosis not present

## 2023-10-19 DIAGNOSIS — I4891 Unspecified atrial fibrillation: Secondary | ICD-10-CM | POA: Diagnosis not present

## 2023-10-19 DIAGNOSIS — R0902 Hypoxemia: Secondary | ICD-10-CM | POA: Diagnosis not present

## 2023-10-19 DIAGNOSIS — M4854XA Collapsed vertebra, not elsewhere classified, thoracic region, initial encounter for fracture: Secondary | ICD-10-CM | POA: Diagnosis present

## 2023-10-19 DIAGNOSIS — I130 Hypertensive heart and chronic kidney disease with heart failure and stage 1 through stage 4 chronic kidney disease, or unspecified chronic kidney disease: Secondary | ICD-10-CM | POA: Diagnosis present

## 2023-10-19 DIAGNOSIS — R339 Retention of urine, unspecified: Secondary | ICD-10-CM | POA: Diagnosis not present

## 2023-10-19 DIAGNOSIS — E8729 Other acidosis: Secondary | ICD-10-CM | POA: Diagnosis not present

## 2023-10-19 DIAGNOSIS — J9811 Atelectasis: Secondary | ICD-10-CM | POA: Diagnosis not present

## 2023-10-19 DIAGNOSIS — R627 Adult failure to thrive: Secondary | ICD-10-CM | POA: Diagnosis present

## 2023-10-19 DIAGNOSIS — J479 Bronchiectasis, uncomplicated: Secondary | ICD-10-CM | POA: Diagnosis not present

## 2023-10-19 DIAGNOSIS — Z950 Presence of cardiac pacemaker: Secondary | ICD-10-CM | POA: Diagnosis not present

## 2023-10-19 DIAGNOSIS — I10 Essential (primary) hypertension: Secondary | ICD-10-CM | POA: Diagnosis not present

## 2023-10-19 DIAGNOSIS — E874 Mixed disorder of acid-base balance: Secondary | ICD-10-CM | POA: Diagnosis present

## 2023-10-19 DIAGNOSIS — E039 Hypothyroidism, unspecified: Secondary | ICD-10-CM | POA: Diagnosis present

## 2023-10-19 DIAGNOSIS — Y95 Nosocomial condition: Secondary | ICD-10-CM | POA: Diagnosis present

## 2023-10-19 DIAGNOSIS — J1001 Influenza due to other identified influenza virus with the same other identified influenza virus pneumonia: Secondary | ICD-10-CM | POA: Diagnosis present

## 2023-10-19 DIAGNOSIS — I5031 Acute diastolic (congestive) heart failure: Secondary | ICD-10-CM | POA: Diagnosis not present

## 2023-10-19 DIAGNOSIS — J111 Influenza due to unidentified influenza virus with other respiratory manifestations: Secondary | ICD-10-CM | POA: Diagnosis not present

## 2023-10-19 DIAGNOSIS — Z66 Do not resuscitate: Secondary | ICD-10-CM | POA: Diagnosis not present

## 2023-10-19 DIAGNOSIS — J929 Pleural plaque without asbestos: Secondary | ICD-10-CM | POA: Diagnosis not present

## 2023-10-19 DIAGNOSIS — Z7901 Long term (current) use of anticoagulants: Secondary | ICD-10-CM | POA: Diagnosis not present

## 2023-10-19 DIAGNOSIS — J9601 Acute respiratory failure with hypoxia: Secondary | ICD-10-CM | POA: Diagnosis present

## 2023-10-19 DIAGNOSIS — K449 Diaphragmatic hernia without obstruction or gangrene: Secondary | ICD-10-CM | POA: Diagnosis not present

## 2023-10-19 DIAGNOSIS — K219 Gastro-esophageal reflux disease without esophagitis: Secondary | ICD-10-CM | POA: Diagnosis present

## 2023-10-19 DIAGNOSIS — J449 Chronic obstructive pulmonary disease, unspecified: Secondary | ICD-10-CM | POA: Diagnosis not present

## 2023-10-19 DIAGNOSIS — D61818 Other pancytopenia: Secondary | ICD-10-CM | POA: Diagnosis present

## 2023-10-19 DIAGNOSIS — J1100 Influenza due to unidentified influenza virus with unspecified type of pneumonia: Secondary | ICD-10-CM | POA: Diagnosis not present

## 2023-10-19 DIAGNOSIS — F32A Depression, unspecified: Secondary | ICD-10-CM | POA: Diagnosis present

## 2023-10-19 DIAGNOSIS — Z515 Encounter for palliative care: Secondary | ICD-10-CM | POA: Diagnosis not present

## 2023-10-19 DIAGNOSIS — R531 Weakness: Secondary | ICD-10-CM | POA: Diagnosis not present

## 2023-10-19 DIAGNOSIS — I495 Sick sinus syndrome: Secondary | ICD-10-CM | POA: Diagnosis present

## 2023-10-19 DIAGNOSIS — J9690 Respiratory failure, unspecified, unspecified whether with hypoxia or hypercapnia: Secondary | ICD-10-CM | POA: Diagnosis not present

## 2023-10-19 DIAGNOSIS — J1008 Influenza due to other identified influenza virus with other specified pneumonia: Secondary | ICD-10-CM | POA: Diagnosis present

## 2023-10-19 DIAGNOSIS — J80 Acute respiratory distress syndrome: Secondary | ICD-10-CM | POA: Diagnosis present

## 2023-10-19 DIAGNOSIS — Z79890 Hormone replacement therapy (postmenopausal): Secondary | ICD-10-CM | POA: Diagnosis not present

## 2023-10-19 DIAGNOSIS — J470 Bronchiectasis with acute lower respiratory infection: Secondary | ICD-10-CM | POA: Diagnosis present

## 2023-10-19 DIAGNOSIS — R000 Tachycardia, unspecified: Secondary | ICD-10-CM | POA: Diagnosis not present

## 2023-10-19 DIAGNOSIS — Z1152 Encounter for screening for COVID-19: Secondary | ICD-10-CM | POA: Diagnosis not present

## 2023-10-19 DIAGNOSIS — J1000 Influenza due to other identified influenza virus with unspecified type of pneumonia: Secondary | ICD-10-CM | POA: Insufficient documentation

## 2023-10-19 DIAGNOSIS — A419 Sepsis, unspecified organism: Secondary | ICD-10-CM | POA: Diagnosis not present

## 2023-10-19 DIAGNOSIS — I4821 Permanent atrial fibrillation: Secondary | ICD-10-CM | POA: Diagnosis present

## 2023-10-19 DIAGNOSIS — J09X1 Influenza due to identified novel influenza A virus with pneumonia: Secondary | ICD-10-CM | POA: Diagnosis not present

## 2023-10-19 DIAGNOSIS — J984 Other disorders of lung: Secondary | ICD-10-CM | POA: Diagnosis not present

## 2023-10-19 DIAGNOSIS — D649 Anemia, unspecified: Secondary | ICD-10-CM | POA: Diagnosis not present

## 2023-10-19 LAB — RESP PANEL BY RT-PCR (FLU A&B, COVID) ARPGX2
Influenza A by PCR: POSITIVE — AB
Influenza B by PCR: NEGATIVE
SARS Coronavirus 2 by RT PCR: NEGATIVE

## 2023-10-20 ENCOUNTER — Other Ambulatory Visit: Payer: Self-pay

## 2023-10-20 ENCOUNTER — Inpatient Hospital Stay (HOSPITAL_COMMUNITY)
Admission: EM | Admit: 2023-10-20 | Discharge: 2023-11-10 | DRG: 193 | Disposition: E | Source: Skilled Nursing Facility | Attending: Internal Medicine | Admitting: Internal Medicine

## 2023-10-20 ENCOUNTER — Emergency Department (HOSPITAL_COMMUNITY)

## 2023-10-20 ENCOUNTER — Encounter (HOSPITAL_COMMUNITY): Payer: Self-pay | Admitting: *Deleted

## 2023-10-20 ENCOUNTER — Inpatient Hospital Stay (HOSPITAL_COMMUNITY)

## 2023-10-20 DIAGNOSIS — A419 Sepsis, unspecified organism: Secondary | ICD-10-CM | POA: Diagnosis not present

## 2023-10-20 DIAGNOSIS — J09X2 Influenza due to identified novel influenza A virus with other respiratory manifestations: Secondary | ICD-10-CM | POA: Diagnosis not present

## 2023-10-20 DIAGNOSIS — R000 Tachycardia, unspecified: Secondary | ICD-10-CM | POA: Diagnosis not present

## 2023-10-20 DIAGNOSIS — I5023 Acute on chronic systolic (congestive) heart failure: Secondary | ICD-10-CM | POA: Diagnosis present

## 2023-10-20 DIAGNOSIS — R0902 Hypoxemia: Secondary | ICD-10-CM | POA: Diagnosis not present

## 2023-10-20 DIAGNOSIS — R627 Adult failure to thrive: Secondary | ICD-10-CM | POA: Diagnosis present

## 2023-10-20 DIAGNOSIS — E039 Hypothyroidism, unspecified: Secondary | ICD-10-CM | POA: Diagnosis present

## 2023-10-20 DIAGNOSIS — Z1152 Encounter for screening for COVID-19: Secondary | ICD-10-CM | POA: Diagnosis not present

## 2023-10-20 DIAGNOSIS — Z823 Family history of stroke: Secondary | ICD-10-CM

## 2023-10-20 DIAGNOSIS — J9690 Respiratory failure, unspecified, unspecified whether with hypoxia or hypercapnia: Secondary | ICD-10-CM | POA: Diagnosis not present

## 2023-10-20 DIAGNOSIS — J90 Pleural effusion, not elsewhere classified: Secondary | ICD-10-CM | POA: Diagnosis not present

## 2023-10-20 DIAGNOSIS — Z7901 Long term (current) use of anticoagulants: Secondary | ICD-10-CM | POA: Diagnosis not present

## 2023-10-20 DIAGNOSIS — R531 Weakness: Secondary | ICD-10-CM | POA: Diagnosis not present

## 2023-10-20 DIAGNOSIS — J129 Viral pneumonia, unspecified: Secondary | ICD-10-CM | POA: Diagnosis present

## 2023-10-20 DIAGNOSIS — I5033 Acute on chronic diastolic (congestive) heart failure: Secondary | ICD-10-CM | POA: Diagnosis not present

## 2023-10-20 DIAGNOSIS — Z7722 Contact with and (suspected) exposure to environmental tobacco smoke (acute) (chronic): Secondary | ICD-10-CM | POA: Diagnosis present

## 2023-10-20 DIAGNOSIS — R339 Retention of urine, unspecified: Secondary | ICD-10-CM | POA: Diagnosis not present

## 2023-10-20 DIAGNOSIS — Z79890 Hormone replacement therapy (postmenopausal): Secondary | ICD-10-CM

## 2023-10-20 DIAGNOSIS — J9601 Acute respiratory failure with hypoxia: Secondary | ICD-10-CM | POA: Diagnosis present

## 2023-10-20 DIAGNOSIS — K219 Gastro-esophageal reflux disease without esophagitis: Secondary | ICD-10-CM | POA: Diagnosis present

## 2023-10-20 DIAGNOSIS — I495 Sick sinus syndrome: Secondary | ICD-10-CM | POA: Diagnosis present

## 2023-10-20 DIAGNOSIS — E8729 Other acidosis: Secondary | ICD-10-CM | POA: Diagnosis not present

## 2023-10-20 DIAGNOSIS — J9621 Acute and chronic respiratory failure with hypoxia: Secondary | ICD-10-CM | POA: Diagnosis not present

## 2023-10-20 DIAGNOSIS — Y95 Nosocomial condition: Secondary | ICD-10-CM | POA: Diagnosis present

## 2023-10-20 DIAGNOSIS — I4821 Permanent atrial fibrillation: Secondary | ICD-10-CM | POA: Diagnosis present

## 2023-10-20 DIAGNOSIS — K449 Diaphragmatic hernia without obstruction or gangrene: Secondary | ICD-10-CM | POA: Diagnosis not present

## 2023-10-20 DIAGNOSIS — Z8249 Family history of ischemic heart disease and other diseases of the circulatory system: Secondary | ICD-10-CM

## 2023-10-20 DIAGNOSIS — Z950 Presence of cardiac pacemaker: Secondary | ICD-10-CM | POA: Diagnosis not present

## 2023-10-20 DIAGNOSIS — J470 Bronchiectasis with acute lower respiratory infection: Secondary | ICD-10-CM | POA: Diagnosis present

## 2023-10-20 DIAGNOSIS — I4891 Unspecified atrial fibrillation: Secondary | ICD-10-CM | POA: Diagnosis not present

## 2023-10-20 DIAGNOSIS — R0602 Shortness of breath: Secondary | ICD-10-CM | POA: Diagnosis not present

## 2023-10-20 DIAGNOSIS — N1832 Chronic kidney disease, stage 3b: Secondary | ICD-10-CM | POA: Diagnosis not present

## 2023-10-20 DIAGNOSIS — I130 Hypertensive heart and chronic kidney disease with heart failure and stage 1 through stage 4 chronic kidney disease, or unspecified chronic kidney disease: Secondary | ICD-10-CM | POA: Diagnosis present

## 2023-10-20 DIAGNOSIS — I5031 Acute diastolic (congestive) heart failure: Secondary | ICD-10-CM | POA: Diagnosis not present

## 2023-10-20 DIAGNOSIS — J1001 Influenza due to other identified influenza virus with the same other identified influenza virus pneumonia: Principal | ICD-10-CM | POA: Diagnosis present

## 2023-10-20 DIAGNOSIS — E874 Mixed disorder of acid-base balance: Secondary | ICD-10-CM | POA: Diagnosis not present

## 2023-10-20 DIAGNOSIS — J80 Acute respiratory distress syndrome: Secondary | ICD-10-CM | POA: Diagnosis not present

## 2023-10-20 DIAGNOSIS — J984 Other disorders of lung: Secondary | ICD-10-CM | POA: Diagnosis not present

## 2023-10-20 DIAGNOSIS — J9801 Acute bronchospasm: Secondary | ICD-10-CM | POA: Diagnosis present

## 2023-10-20 DIAGNOSIS — Z66 Do not resuscitate: Secondary | ICD-10-CM | POA: Diagnosis not present

## 2023-10-20 DIAGNOSIS — F32A Depression, unspecified: Secondary | ICD-10-CM | POA: Diagnosis present

## 2023-10-20 DIAGNOSIS — Z6826 Body mass index (BMI) 26.0-26.9, adult: Secondary | ICD-10-CM

## 2023-10-20 DIAGNOSIS — J479 Bronchiectasis, uncomplicated: Secondary | ICD-10-CM | POA: Diagnosis not present

## 2023-10-20 DIAGNOSIS — E876 Hypokalemia: Secondary | ICD-10-CM | POA: Diagnosis present

## 2023-10-20 DIAGNOSIS — D61818 Other pancytopenia: Secondary | ICD-10-CM | POA: Diagnosis not present

## 2023-10-20 DIAGNOSIS — J09X1 Influenza due to identified novel influenza A virus with pneumonia: Secondary | ICD-10-CM | POA: Insufficient documentation

## 2023-10-20 DIAGNOSIS — R54 Age-related physical debility: Secondary | ICD-10-CM | POA: Diagnosis present

## 2023-10-20 DIAGNOSIS — I10 Essential (primary) hypertension: Secondary | ICD-10-CM | POA: Diagnosis present

## 2023-10-20 DIAGNOSIS — D649 Anemia, unspecified: Secondary | ICD-10-CM | POA: Diagnosis not present

## 2023-10-20 DIAGNOSIS — I482 Chronic atrial fibrillation, unspecified: Secondary | ICD-10-CM | POA: Diagnosis present

## 2023-10-20 DIAGNOSIS — Z79899 Other long term (current) drug therapy: Secondary | ICD-10-CM

## 2023-10-20 DIAGNOSIS — M40209 Unspecified kyphosis, site unspecified: Secondary | ICD-10-CM | POA: Diagnosis present

## 2023-10-20 DIAGNOSIS — M4854XA Collapsed vertebra, not elsewhere classified, thoracic region, initial encounter for fracture: Secondary | ICD-10-CM | POA: Diagnosis present

## 2023-10-20 DIAGNOSIS — R451 Restlessness and agitation: Secondary | ICD-10-CM | POA: Diagnosis not present

## 2023-10-20 DIAGNOSIS — N179 Acute kidney failure, unspecified: Secondary | ICD-10-CM | POA: Diagnosis not present

## 2023-10-20 DIAGNOSIS — J1008 Influenza due to other identified influenza virus with other specified pneumonia: Secondary | ICD-10-CM | POA: Diagnosis present

## 2023-10-20 DIAGNOSIS — J1100 Influenza due to unidentified influenza virus with unspecified type of pneumonia: Secondary | ICD-10-CM | POA: Diagnosis not present

## 2023-10-20 DIAGNOSIS — I5030 Unspecified diastolic (congestive) heart failure: Secondary | ICD-10-CM | POA: Diagnosis not present

## 2023-10-20 DIAGNOSIS — Z515 Encounter for palliative care: Secondary | ICD-10-CM

## 2023-10-20 DIAGNOSIS — Z604 Social exclusion and rejection: Secondary | ICD-10-CM | POA: Diagnosis present

## 2023-10-20 DIAGNOSIS — R918 Other nonspecific abnormal finding of lung field: Secondary | ICD-10-CM | POA: Diagnosis not present

## 2023-10-20 LAB — CBC WITH DIFFERENTIAL/PLATELET
Abs Immature Granulocytes: 0.01 10*3/uL (ref 0.00–0.07)
Basophils Absolute: 0 10*3/uL (ref 0.0–0.1)
Basophils Relative: 1 %
Eosinophils Absolute: 0 10*3/uL (ref 0.0–0.5)
Eosinophils Relative: 1 %
HCT: 32.7 % — ABNORMAL LOW (ref 36.0–46.0)
Hemoglobin: 10.7 g/dL — ABNORMAL LOW (ref 12.0–15.0)
Immature Granulocytes: 1 %
Lymphocytes Relative: 20 %
Lymphs Abs: 0.4 10*3/uL — ABNORMAL LOW (ref 0.7–4.0)
MCH: 32 pg (ref 26.0–34.0)
MCHC: 32.7 g/dL (ref 30.0–36.0)
MCV: 97.9 fL (ref 80.0–100.0)
Monocytes Absolute: 0.4 10*3/uL (ref 0.1–1.0)
Monocytes Relative: 17 %
Neutro Abs: 1.2 10*3/uL — ABNORMAL LOW (ref 1.7–7.7)
Neutrophils Relative %: 60 %
Platelets: 89 10*3/uL — ABNORMAL LOW (ref 150–400)
RBC: 3.34 MIL/uL — ABNORMAL LOW (ref 3.87–5.11)
RDW: 15.9 % — ABNORMAL HIGH (ref 11.5–15.5)
WBC: 2.1 10*3/uL — ABNORMAL LOW (ref 4.0–10.5)
nRBC: 0 % (ref 0.0–0.2)

## 2023-10-20 LAB — BRAIN NATRIURETIC PEPTIDE: B Natriuretic Peptide: 859 pg/mL — ABNORMAL HIGH (ref 0.0–100.0)

## 2023-10-20 LAB — RESP PANEL BY RT-PCR (RSV, FLU A&B, COVID)  RVPGX2
Influenza A by PCR: POSITIVE — AB
Influenza B by PCR: NEGATIVE
Resp Syncytial Virus by PCR: NEGATIVE
SARS Coronavirus 2 by RT PCR: NEGATIVE

## 2023-10-20 LAB — FOLATE: Folate: 8.1 ng/mL (ref >5.9)

## 2023-10-20 LAB — COMPREHENSIVE METABOLIC PANEL
ALT: 36 U/L (ref 0–44)
AST: 40 U/L (ref 15–41)
Albumin: 2.8 g/dL — ABNORMAL LOW (ref 3.5–5.0)
Alkaline Phosphatase: 98 U/L (ref 38–126)
Anion gap: 9 (ref 5–15)
BUN: 15 mg/dL (ref 8–23)
CO2: 27 mmol/L (ref 22–32)
Calcium: 9.1 mg/dL (ref 8.9–10.3)
Chloride: 100 mmol/L (ref 98–111)
Creatinine, Ser: 1.02 mg/dL — ABNORMAL HIGH (ref 0.44–1.00)
GFR, Estimated: 55 mL/min — ABNORMAL LOW (ref >60)
Glucose, Bld: 133 mg/dL — ABNORMAL HIGH (ref 70–99)
Potassium: 4.2 mmol/L (ref 3.5–5.1)
Sodium: 136 mmol/L (ref 135–145)
Total Bilirubin: 2.1 mg/dL — ABNORMAL HIGH (ref 0.0–1.2)
Total Protein: 5.8 g/dL — ABNORMAL LOW (ref 6.5–8.1)

## 2023-10-20 LAB — LACTIC ACID, PLASMA
Lactic Acid, Venous: 1.1 mmol/L (ref 0.5–1.9)
Lactic Acid, Venous: 1.6 mmol/L (ref 0.5–1.9)

## 2023-10-20 LAB — TROPONIN I (HIGH SENSITIVITY)
Troponin I (High Sensitivity): 11 ng/L (ref <18)
Troponin I (High Sensitivity): 12 ng/L (ref <18)

## 2023-10-20 LAB — VITAMIN B12: Vitamin B-12: 522 pg/mL (ref 180–914)

## 2023-10-20 LAB — PROCALCITONIN: Procalcitonin: 0.25 ng/mL

## 2023-10-20 MED ORDER — OSELTAMIVIR PHOSPHATE 30 MG PO CAPS
30.0000 mg | ORAL_CAPSULE | Freq: Two times a day (BID) | ORAL | Status: DC
  Administered 2023-10-20 – 2023-10-22 (×4): 30 mg via ORAL
  Filled 2023-10-20 (×4): qty 1

## 2023-10-20 MED ORDER — LACTATED RINGERS IV BOLUS
500.0000 mL | Freq: Once | INTRAVENOUS | Status: AC
  Administered 2023-10-20: 500 mL via INTRAVENOUS

## 2023-10-20 MED ORDER — APIXABAN 5 MG PO TABS
5.0000 mg | ORAL_TABLET | Freq: Two times a day (BID) | ORAL | Status: DC
  Administered 2023-10-20 – 2023-10-27 (×14): 5 mg via ORAL
  Filled 2023-10-20 (×14): qty 1

## 2023-10-20 MED ORDER — VANCOMYCIN HCL IN DEXTROSE 1-5 GM/200ML-% IV SOLN: 1000.0000 mg | Freq: Once | INTRAVENOUS | Status: DC

## 2023-10-20 MED ORDER — ARFORMOTEROL TARTRATE 15 MCG/2ML IN NEBU
15.0000 ug | INHALATION_SOLUTION | Freq: Two times a day (BID) | RESPIRATORY_TRACT | Status: DC
  Administered 2023-10-20 – 2023-10-27 (×14): 15 ug via RESPIRATORY_TRACT
  Filled 2023-10-20 (×13): qty 2

## 2023-10-20 MED ORDER — ONDANSETRON HCL 4 MG/2ML IJ SOLN: 4.0000 mg | Freq: Four times a day (QID) | INTRAMUSCULAR | Status: DC | PRN

## 2023-10-20 MED ORDER — IPRATROPIUM-ALBUTEROL 0.5-2.5 (3) MG/3ML IN SOLN
3.0000 mL | Freq: Four times a day (QID) | RESPIRATORY_TRACT | Status: DC
  Administered 2023-10-20 (×3): 3 mL via RESPIRATORY_TRACT
  Filled 2023-10-20 (×4): qty 3

## 2023-10-20 MED ORDER — ACETAMINOPHEN 650 MG RE SUPP: 650.0000 mg | Freq: Four times a day (QID) | RECTAL | Status: DC | PRN

## 2023-10-20 MED ORDER — CLONAZEPAM 0.5 MG PO TABS
0.5000 mg | ORAL_TABLET | Freq: Two times a day (BID) | ORAL | Status: DC
  Administered 2023-10-20 – 2023-10-27 (×13): 0.5 mg via ORAL
  Filled 2023-10-20 (×13): qty 1

## 2023-10-20 MED ORDER — LEVALBUTEROL HCL 0.63 MG/3ML IN NEBU
0.6300 mg | INHALATION_SOLUTION | Freq: Four times a day (QID) | RESPIRATORY_TRACT | Status: DC
  Administered 2023-10-21 – 2023-10-22 (×5): 0.63 mg via RESPIRATORY_TRACT
  Filled 2023-10-20 (×7): qty 3

## 2023-10-20 MED ORDER — BUPROPION HCL 100 MG PO TABS
100.0000 mg | ORAL_TABLET | Freq: Three times a day (TID) | ORAL | Status: DC
  Administered 2023-10-21 – 2023-10-27 (×17): 100 mg via ORAL
  Filled 2023-10-20 (×22): qty 1

## 2023-10-20 MED ORDER — METHYLPREDNISOLONE SODIUM SUCC 125 MG IJ SOLR
125.0000 mg | Freq: Once | INTRAMUSCULAR | Status: AC
  Administered 2023-10-20: 125 mg via INTRAVENOUS
  Filled 2023-10-20: qty 2

## 2023-10-20 MED ORDER — VITAMIN D 25 MCG (1000 UNIT) PO TABS
1000.0000 [IU] | ORAL_TABLET | Freq: Every day | ORAL | Status: DC
  Administered 2023-10-20 – 2023-10-27 (×8): 1000 [IU] via ORAL
  Filled 2023-10-20 (×8): qty 1

## 2023-10-20 MED ORDER — QUETIAPINE FUMARATE 50 MG PO TABS
50.0000 mg | ORAL_TABLET | Freq: Every day | ORAL | Status: DC
  Administered 2023-10-20 – 2023-10-26 (×6): 50 mg via ORAL
  Filled 2023-10-20 (×2): qty 2
  Filled 2023-10-20 (×2): qty 1
  Filled 2023-10-20: qty 2
  Filled 2023-10-20: qty 1

## 2023-10-20 MED ORDER — DILTIAZEM HCL 60 MG PO TABS
60.0000 mg | ORAL_TABLET | Freq: Four times a day (QID) | ORAL | Status: DC
  Administered 2023-10-20 – 2023-10-23 (×13): 60 mg via ORAL
  Filled 2023-10-20: qty 1
  Filled 2023-10-20: qty 2
  Filled 2023-10-20: qty 1
  Filled 2023-10-20 (×2): qty 2
  Filled 2023-10-20 (×2): qty 1
  Filled 2023-10-20: qty 2
  Filled 2023-10-20 (×3): qty 1
  Filled 2023-10-20: qty 2
  Filled 2023-10-20: qty 1

## 2023-10-20 MED ORDER — ACETAMINOPHEN 325 MG PO TABS
650.0000 mg | ORAL_TABLET | Freq: Four times a day (QID) | ORAL | Status: DC | PRN
  Filled 2023-10-20: qty 2

## 2023-10-20 MED ORDER — SODIUM CHLORIDE 0.9 % IV SOLN
2.0000 g | Freq: Once | INTRAVENOUS | Status: AC
  Administered 2023-10-20: 2 g via INTRAVENOUS
  Filled 2023-10-20: qty 12.5

## 2023-10-20 MED ORDER — METOPROLOL TARTRATE 25 MG PO TABS
25.0000 mg | ORAL_TABLET | Freq: Three times a day (TID) | ORAL | Status: DC
  Administered 2023-10-20 – 2023-10-22 (×5): 25 mg via ORAL
  Filled 2023-10-20 (×5): qty 1

## 2023-10-20 MED ORDER — LACTATED RINGERS IV SOLN: INTRAVENOUS | Status: AC

## 2023-10-20 MED ORDER — METHYLPREDNISOLONE SODIUM SUCC 125 MG IJ SOLR
60.0000 mg | Freq: Two times a day (BID) | INTRAMUSCULAR | Status: DC
Start: 2023-10-20 — End: 2023-10-20

## 2023-10-20 MED ORDER — IPRATROPIUM-ALBUTEROL 0.5-2.5 (3) MG/3ML IN SOLN
3.0000 mL | Freq: Once | RESPIRATORY_TRACT | Status: AC
  Administered 2023-10-20: 3 mL via RESPIRATORY_TRACT
  Filled 2023-10-20: qty 3

## 2023-10-20 MED ORDER — IPRATROPIUM-ALBUTEROL 0.5-2.5 (3) MG/3ML IN SOLN
RESPIRATORY_TRACT | Status: AC
  Filled 2023-10-20: qty 3

## 2023-10-20 MED ORDER — LEVOTHYROXINE SODIUM 25 MCG PO TABS
25.0000 ug | ORAL_TABLET | Freq: Every day | ORAL | Status: DC
  Administered 2023-10-21 – 2023-10-27 (×6): 25 ug via ORAL
  Filled 2023-10-20 (×6): qty 1

## 2023-10-20 MED ORDER — OSELTAMIVIR PHOSPHATE 30 MG PO CAPS: 30.0000 mg | ORAL_CAPSULE | Freq: Two times a day (BID) | ORAL | Status: DC

## 2023-10-20 MED ORDER — VANCOMYCIN HCL 1500 MG/300ML IV SOLN: 1500.0000 mg | Freq: Once | INTRAVENOUS | Status: DC

## 2023-10-20 MED ORDER — OSELTAMIVIR PHOSPHATE 75 MG PO CAPS
75.0000 mg | ORAL_CAPSULE | Freq: Once | ORAL | Status: AC
  Administered 2023-10-20: 75 mg via ORAL
  Filled 2023-10-20: qty 1

## 2023-10-20 MED ORDER — PANTOPRAZOLE SODIUM 40 MG PO TBEC
40.0000 mg | DELAYED_RELEASE_TABLET | Freq: Every day | ORAL | Status: DC
  Administered 2023-10-20 – 2023-10-27 (×8): 40 mg via ORAL
  Filled 2023-10-20 (×8): qty 1

## 2023-10-20 MED ORDER — METHYLPREDNISOLONE SODIUM SUCC 125 MG IJ SOLR
60.0000 mg | Freq: Two times a day (BID) | INTRAMUSCULAR | Status: DC
  Administered 2023-10-20 – 2023-10-24 (×8): 60 mg via INTRAVENOUS
  Filled 2023-10-20 (×8): qty 2

## 2023-10-20 MED ORDER — DULOXETINE HCL 30 MG PO CPEP
30.0000 mg | ORAL_CAPSULE | Freq: Every day | ORAL | Status: DC
  Administered 2023-10-20 – 2023-10-27 (×8): 30 mg via ORAL
  Filled 2023-10-20 (×8): qty 1

## 2023-10-20 MED ORDER — VANCOMYCIN HCL 1500 MG/300ML IV SOLN
1500.0000 mg | Freq: Once | INTRAVENOUS | Status: AC
Start: 2023-10-20 — End: 2023-10-21
  Administered 2023-10-20: 1500 mg via INTRAVENOUS
  Filled 2023-10-20 (×2): qty 300

## 2023-10-20 MED ORDER — ONDANSETRON HCL 4 MG PO TABS: 4.0000 mg | ORAL_TABLET | Freq: Four times a day (QID) | ORAL | Status: DC | PRN

## 2023-10-20 MED ORDER — VANCOMYCIN HCL 1250 MG/250ML IV SOLN
1250.0000 mg | INTRAVENOUS | Status: DC
  Administered 2023-10-21: 1250 mg via INTRAVENOUS
  Filled 2023-10-20 (×2): qty 250

## 2023-10-20 MED ORDER — SODIUM CHLORIDE 0.9 % IV SOLN
2.0000 g | Freq: Two times a day (BID) | INTRAVENOUS | Status: DC
  Administered 2023-10-20 – 2023-10-22 (×4): 2 g via INTRAVENOUS
  Filled 2023-10-20 (×4): qty 12.5

## 2023-10-20 MED ORDER — DOXYCYCLINE HYCLATE 100 MG PO TABS
100.0000 mg | ORAL_TABLET | Freq: Two times a day (BID) | ORAL | Status: DC
  Administered 2023-10-20 – 2023-10-23 (×7): 100 mg via ORAL
  Filled 2023-10-20 (×7): qty 1

## 2023-10-20 MED ORDER — METOPROLOL TARTRATE 5 MG/5ML IV SOLN
5.0000 mg | Freq: Four times a day (QID) | INTRAVENOUS | Status: DC
  Administered 2023-10-20 – 2023-10-22 (×6): 5 mg via INTRAVENOUS
  Filled 2023-10-20 (×7): qty 5

## 2023-10-20 NOTE — H&P (Signed)
 History and Physical    Patient: Lauren Baker ZOX:[id number] DOB: 18-Apr-1941 DOA: 10/20/2023 DOS: the patient was seen and examined on 10/20/2023 PCP: Elfredia Nevins, MD  Patient coming from: SNF  Chief Complaint:  Chief Complaint  Patient presents with   Shortness of Breath   HPI: Lauren Baker is a 83 year old female with a history of permanent atrial fibrillation status post PPM, hypertension, hypothyroidism, depression/anxiety presenting with shortness of breath and hypoxia.  The patient had a recent hospital admission from 09/21/2023 to 09/24/2023 for acute respiratory failure.  The patient was initially started on antibiotics, but these were discontinued.  The patient was discharged with a prednisone taper for alveolitis.  She was discharged on 4 L.  She has been since weaned to 1.5 L. The patient had been doing fairly well up until 10/19/2023.  She had been making progress with her PT and ambulating with a walker over 100 feet.  She did develop some coughing and chest congestion.  In the evening of 10/19/2023, the patient began having worsening shortness of breath.  She was noted to be hypoxic with oxygen saturation in the 70s.  She was transferred to the emergency department further evaluation and treatment.  The patient denies any headache, neck pain, chest pain, hemoptysis, nausea, vomiting, diarrhea, abdominal pain. In the ED, the patient was afebrile.  She was tachycardic into the 110s.  She was hemodynamically stable.  Oxygen saturation initially was 94% on a nonrebreather mask.  She was weaned down to 5 L with saturation 94-95%.  The patient was given a DuoNeb.  WBC 2.1, hemoglobin 10.7, platelets 89.  Sodium 136, potassium 4.2, bicarbonate 27, serum creatinine 1.00.  AST 40, ALT 36, alkaline phosphatase 90, total bilirubin 2.1.  Chest x-ray showed bilateral patchy opacities.  There is increased interstitial markings.  She was given vancomycin and cefepime.  She was admitted for  further evaluation and treatment of her respiratory failure.  Review of Systems: As mentioned in the history of present illness. All other systems reviewed and are negative. Past Medical History:  Diagnosis Date   Chronic atrial fibrillation (HCC)    Hypertension    Past Surgical History:  Procedure Laterality Date   detatched retina     PACEMAKER IMPLANT N/A 10/25/2018   Procedure: PACEMAKER IMPLANT;  Surgeon: Hillis Range, MD;  Location: [facility];  Service: Cardiovascular;  Laterality: N/A;   Social History:  reports that she has never smoked. She has never used smokeless tobacco. She reports that she does not drink alcohol and does not use drugs.  No Known Allergies  Family History  Problem Relation Age of Onset   CVA Mother    Hypertension Sister    Heart attack Brother    Heart attack Sister    Cancer Sister     Prior to Admission medications   Medication Sig Start Date End Date Taking? Authorizing Provider  acetaminophen (TYLENOL) 325 MG tablet Take 2 tablets (650 mg total) by mouth every 6 (six) hours as needed for mild pain (pain score 1-3) (or Fever >/= 101). Patient not taking: Reported on 10/08/2023 09/24/23   Vassie Loll, MD  apixaban (ELIQUIS) 5 MG TABS tablet TAKE 1 TABLET BY MOUTH TWICE DAILY. 07/03/21   Antoine Poche, MD  buPROPion (WELLBUTRIN) 100 MG tablet Take 100 mg by mouth 3 (three) times daily. 02/26/18   [provider]  cholecalciferol (VITAMIN D) 1000 units tablet Take 1,000 Units by mouth daily.  [provider]  clonazePAM (KLONOPIN) 0.5 MG tablet Take 0.5 mg by mouth 2 (two) times daily. 01/19/18   [provider]  dextromethorphan-guaiFENesin (MUCINEX DM) 30-600 MG 12hr tablet Take 1 tablet by mouth 2 (two) times daily as needed for cough. 09/24/23   Vassie Loll, MD  diltiazem (CARDIZEM CD) 300 MG 24 hr capsule Take 1 capsule (300 mg total) by mouth daily. 09/29/22   Antoine Poche, MD  DULoxetine  (CYMBALTA) 30 MG capsule Take 30 mg by mouth daily.  12/13/17   [provider]  furosemide (LASIX) 20 MG tablet Take 20 mg by mouth daily.    [provider]  levothyroxine (SYNTHROID) 25 MCG tablet Take 25 mcg by mouth daily. 11/27/21   [provider]  losartan (COZAAR) 50 MG tablet Take 1 tablet (50 mg total) by mouth daily. 09/29/22   Antoine Poche, MD  metoprolol (TOPROL-XL) 200 MG 24 hr tablet Take 1 tablet (200 mg total) by mouth daily. 09/29/22   Antoine Poche, MD  omeprazole (PRILOSEC) 40 MG capsule Take 40 mg by mouth daily.    [provider]  predniSONE (DELTASONE) 20 MG tablet Take 3 tablets by mouth daily x 1 day; then 2 tablets by mouth daily x 2 days; then 1 tablet by mouth daily x 3 days; then half tablet by mouth daily x 3 days and stop prednisone. 09/24/23   Vassie Loll, MD  QUEtiapine (SEROQUEL) 50 MG tablet Take 50 mg by mouth at bedtime.  02/26/18   [provider]    Physical Exam: Vitals:   10/20/23 0530 10/20/23 0600 10/20/23 0630 10/20/23 0700  BP: 106/75 121/74 124/70 116/65  Pulse: (!) 101 (!) 103 (!) 118 (!) 117  Resp: (!) 37 (!) 26 (!) 31 (!) 21  Temp:      TempSrc:      SpO2: 98% 96% 97% 92%  Weight:      Height:       GENERAL:  A&O x 3, NAD, well developed, cooperative, follows commands HEENT: [place]/AT, No thrush, No icterus, No oral ulcers Neck:  No neck mass, No meningismus, soft, supple CV: RRR, no S3, no S4, no rub, no JVD Lungs: Bilateral rales.  Bilateral expiratory wheeze.  Good air movement. Abd: soft/NT +BS, nondistended Ext: trace LE edema, no lymphangitis, no cyanosis, no rashes Neuro:  CN II-XII intact, strength 4/5 in RUE, RLE, strength 4/5 LUE, LLE; sensation intact bilateral; no dysmetria; babinski equivocal  Data Reviewed: Laboratory data reviewed above in the history  Assessment and Plan: Acute on chronic respiratory failure with hypoxia -Secondary to influenza pneumonitis -Currently  on 5 L -Check VBG -Wean oxygen back to baseline for saturation greater 92% -CT chest -check lactate  Influenza pneumonia -Continue oseltamavir -CT chest -Check PCT -MRSA screen -start IVF  Bronchospasm/alveolitis -Start IV Solu-Medrol -Start Brovana -Continue DuoNebs  Permanent atrial fibrillation with RVR -start short acting metoprolol for now with plans to transition to long acting once she is more stable -09/22/2023 echo 50-55%, indeterminate diastology, normal RVF, mild MR -Continue apixaban  Hypertension -Holding losartan -Continue metoprolol  Pancytopenia -Likely secondary to viral infection -Check B12 -Check folate  CKD stage IIIb -Baseline creatinine 1,1-1.4  Hypothyroidism -GERD Synthroid  Depression -Continue Klonopin, will bupropion, Seroquel      Advance Care Planning: FULL  Consults: none  Family Communication: daughter 3/11  Severity of Illness: The appropriate patient status for this patient is INPATIENT. Inpatient status is judged to be reasonable  and necessary in order to provide the required intensity of service to ensure the patient's safety. The patient's presenting symptoms, physical exam findings, and initial radiographic and laboratory data in the context of their chronic comorbidities is felt to place them at high risk for further clinical deterioration. Furthermore, it is not anticipated that the patient will be medically stable for discharge from the hospital within 2 midnights of admission.   * I certify that at the point of admission it is my clinical judgment that the patient will require inpatient hospital care spanning beyond 2 midnights from the point of admission due to high intensity of service, high risk for further deterioration and high frequency of surveillance required.*  Author: Catarina Hartshorn, MD 10/20/2023 7:38 AM  For on call review [email or web address].

## 2023-10-20 NOTE — Progress Notes (Signed)
   10/20/23 1506  Vitals  Temp 98.1 F (36.7 C)  Temp Source Oral  BP 134/78  Pulse Rate (!) 109  Level of Consciousness  Level of Consciousness Alert  MEWS COLOR  MEWS Score Color Yellow  Oxygen Therapy  SpO2 99 %  O2 Device Nasal Cannula  Pain Assessment  Pain Scale 0-10  Pain Score 0  MEWS Score  MEWS Temp 0  MEWS Systolic 0  MEWS Pulse 1  MEWS RR 1  MEWS LOC 0  MEWS Score 2  Provider Notification  Provider Name/Title Onalee Hua tat  Date Provider Notified 10/20/23  Time Provider Notified 1506  Method of Notification  (Chat)  Notification Reason Other (Comment) (Yellow MEWS)  Provider response See new orders   Vitals and EKG completed

## 2023-10-20 NOTE — ED Notes (Signed)
 Patient transported to CT

## 2023-10-20 NOTE — Hospital Course (Signed)
 Lauren Baker is a  83 year old female with a history of permanent atrial fibrillation status post PPM, hypertension, hypothyroidism, depression/anxiety presenting with shortness of breath and hypoxia.  The patient had a recent hospital admission from 09/21/2023 to 09/24/2023 for acute respiratory failure.  The patient was initially started on antibiotics, but these were discontinued.  The patient was discharged with a prednisone taper for alveolitis.  She was discharged on 4 L.  She has been since weaned to 1.5 L. The patient had been doing fairly well up until 10/19/2023.  She had been making progress with her PT and ambulating with a walker over 100 feet.  She did develop some coughing and chest congestion.  In the evening of 10/19/2023, the patient began having worsening shortness of breath.  She was noted to be hypoxic with oxygen saturation in the 70s.  She was transferred to the emergency department further evaluation and treatment.    ED: the patient was afebrile.  She was tachycardic into the 110s.  She was hemodynamically stable.  Oxygen saturation initially was 94% on a nonrebreather mask.  She was weaned down to 5 L with saturation 94-95%.  The patient was given a DuoNeb.  WBC 2.1, hemoglobin 10.7, platelets 89.  Sodium 136, potassium 4.2, bicarbonate 27, serum creatinine 1.00.  AST 40, ALT 36, alkaline phosphatase 90, total bilirubin 2.1.  Chest x-ray showed bilateral patchy opacities.  There is increased interstitial markings.  She was given vancomycin and cefepime.  She was admitted for further evaluation and treatment of her respiratory failure.    Assessment and Plan:  Acute on chronic respiratory failure with hypoxia -Influenza A positive -viral pneumonia -Respiratory failure due to  influenza pneumonitis -POA:  ON  on 5 L >>> still 5 L, satting 91% -As needed VBG -Wean oxygen back to baseline for saturation greater 92%  -CT chest-reviewed Patchy ground-glass density superimposed  on interstitial thickening and bronchiectasis. Findings are not changed from comparison exam except for the effusions are slightly larger. Findings are favored to represent pulmonary edema. New bronchial mucoid material within the RIGHT bronchus intermedius. Stable prominent mediastinal lymph nodes. 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 -Lactic acid 1.1, 1.6, -Continue mucolytics, spirometer flutter valve, -Lasix 40 mg IV x 1 based on above findings   Influenza pneumonia -Continue oseltamavir  -Lactic acid 1.1, 1.6 -MRSA screen -start IVF   Bronchospasm/alveolitis -Start IV Solu-Medrol -Start Brovana -Continue DuoNebs   Permanent atrial fibrillation with RVR -Heart rate 81-1 24, currently 110 -Was initially started on metoprolol - Currently on Cardizem 60 mg p.o. every 6 -09/22/2023 echo 50-55%, indeterminate diastology, normal RVF, mild MR -Continue Apixaban   Hypertension -Holding losartan -On metoprolol, diltiazem   Pancytopenia -Likely secondary to viral infection -Serum B12 522- normal -Serum folate 0.1- normal   CKD stage IIIb -Baseline creatinine 1,1-1.4 Stable  Hypothyroidism -Continue Synthroid   Depression -Continue Klonopin, will bupropion, Seroquel

## 2023-10-20 NOTE — ED Provider Notes (Signed)
 [place] EMERGENCY DEPARTMENT AT [facility] Provider Note   CSN: [id number] Arrival date & time: 10/20/23  0431     History  Chief Complaint  Patient presents with   Shortness of Breath    Lauren Baker is a 83 y.o. female.  Patient is an 83 year old female with history of chronic atrial fibrillation, hypertension, pacemaker placement, GERD, and recent admission for hypoxic respiratory failure.  Patient presenting today for evaluation of shortness of breath.  She was sent from the pain center after developing this symptom and being found with oxygen saturations in the 70s.  Patient required a DuoNeb and nonrebreather to maintain saturations in the low 90s.  Patient has had some cough and congestion recently and I am told tested positive for influenza A yesterday.  Patient denies she is having any pain.  Majority of history taken from patient's daughter and son-in-law who are present at bedside.  The history is provided by the patient.       Home Medications Prior to Admission medications   Medication Sig Start Date End Date Taking? Authorizing Provider  acetaminophen (TYLENOL) 325 MG tablet Take 2 tablets (650 mg total) by mouth every 6 (six) hours as needed for mild pain (pain score 1-3) (or Fever >/= 101). Patient not taking: Reported on 10/08/2023 09/24/23   Vassie Loll, MD  apixaban (ELIQUIS) 5 MG TABS tablet TAKE 1 TABLET BY MOUTH TWICE DAILY. 07/03/21   Antoine Poche, MD  buPROPion (WELLBUTRIN) 100 MG tablet Take 100 mg by mouth 3 (three) times daily. 02/26/18   [provider]  cholecalciferol (VITAMIN D) 1000 units tablet Take 1,000 Units by mouth daily.    [provider]  clonazePAM (KLONOPIN) 0.5 MG tablet Take 0.5 mg by mouth 2 (two) times daily. 01/19/18   [provider]  dextromethorphan-guaiFENesin (MUCINEX DM) 30-600 MG 12hr tablet Take 1 tablet by mouth 2 (two) times daily as needed for cough. 09/24/23   Vassie Loll,  MD  diltiazem (CARDIZEM CD) 300 MG 24 hr capsule Take 1 capsule (300 mg total) by mouth daily. 09/29/22   Antoine Poche, MD  DULoxetine (CYMBALTA) 30 MG capsule Take 30 mg by mouth daily.  12/13/17   [provider]  furosemide (LASIX) 20 MG tablet Take 20 mg by mouth daily.    [provider]  levothyroxine (SYNTHROID) 25 MCG tablet Take 25 mcg by mouth daily. 11/27/21   [provider]  losartan (COZAAR) 50 MG tablet Take 1 tablet (50 mg total) by mouth daily. 09/29/22   Antoine Poche, MD  metoprolol (TOPROL-XL) 200 MG 24 hr tablet Take 1 tablet (200 mg total) by mouth daily. 09/29/22   Antoine Poche, MD  omeprazole (PRILOSEC) 40 MG capsule Take 40 mg by mouth daily.    [provider]  predniSONE (DELTASONE) 20 MG tablet Take 3 tablets by mouth daily x 1 day; then 2 tablets by mouth daily x 2 days; then 1 tablet by mouth daily x 3 days; then half tablet by mouth daily x 3 days and stop prednisone. 09/24/23   Vassie Loll, MD  QUEtiapine (SEROQUEL) 50 MG tablet Take 50 mg by mouth at bedtime.  02/26/18   [provider]      Allergies    Patient has no known allergies.    Review of Systems   Review of Systems  All other systems reviewed and are negative.   Physical Exam Updated Vital Signs BP (!) 142/117 (  BP Location: Right Arm)   Pulse 96   Temp 98.8 F (37.1 C) (Oral)   Resp (!) 32   Ht 5\' 9"  (1.753 m)   Wt 79.8 kg   SpO2 94%   BMI 25.98 kg/m  Physical Exam Vitals and nursing note reviewed.  Constitutional:      General: She is not in acute distress.    Appearance: She is well-developed. She is not diaphoretic.  HENT:     Head: Normocephalic and atraumatic.  Cardiovascular:     Rate and Rhythm: Normal rate and regular rhythm.     Heart sounds: No murmur heard.    No friction rub. No gallop.  Pulmonary:     Effort: Pulmonary effort is normal. No respiratory distress.     Breath sounds: Examination of the  right-middle field reveals rhonchi. Examination of the left-middle field reveals rhonchi. Rhonchi present. No wheezing.  Abdominal:     General: Bowel sounds are normal. There is no distension.     Palpations: Abdomen is soft.     Tenderness: There is no abdominal tenderness.  Musculoskeletal:        General: Normal range of motion.     Cervical back: Normal range of motion and neck supple.     Right lower leg: No tenderness. No edema.     Left lower leg: No tenderness. No edema.  Skin:    General: Skin is warm and dry.  Neurological:     General: No focal deficit present.     Mental Status: She is alert and oriented to person, place, and time.     ED Results / Procedures / Treatments   Labs (all labs ordered are listed, but only abnormal results are displayed) Labs Reviewed  RESP PANEL BY RT-PCR (RSV, FLU A&B, COVID)  [id number]  COMPREHENSIVE METABOLIC PANEL  CBC WITH DIFFERENTIAL/PLATELET  BRAIN NATRIURETIC PEPTIDE  TROPONIN I (HIGH SENSITIVITY)    EKG None  Radiology No results found.  Procedures Procedures    Medications Ordered in ED Medications  ipratropium-albuterol (DUONEB) 0.5-2.5 (3) MG/3ML nebulizer solution 3 mL (has no administration in time range)    ED Course/ Medical Decision Making/ A&P  Patient is an 83 year old female with past medical history as per HPI presenting with complaints of shortness of breath.  This came on suddenly just prior to presentation.  Patient required nonrebreather to maintain saturations in the 90s.  I am told she was 70% at the time of EMS arrival.  Patient denies having any chest pain.  She does describe some cough and was diagnosed with influenza A yesterday.  Vital signs otherwise stable.  Laboratory studies obtained including CBC, CMP, troponin, and BNP.  White count is 2.1 with BNP of 859.  Laboratory studies otherwise unremarkable.  Chest x-ray obtained showing evidence for pulmonary edema.  Atypical infection also  possible.  Patient has been given vancomycin and cefepime for presumed HCAP.  She has been weaned off of oxygen after receiving DuoNeb treatments and is currently on 5 L nasal cannula with saturations in the mid 90s.  Patient will require admission for further workup and care.  Final Clinical Impression(s) / ED Diagnoses Final diagnoses:  None    Rx / DC Orders ED Discharge Orders     None         Geoffery Lyons, MD 10/20/23 2323

## 2023-10-20 NOTE — ED Triage Notes (Signed)
 Pt BIB [facility] From [facility] for c/o sob that started suddenly tonight  Staff reports pt was in 70's with O2. They administered a duoneb and pt increased to low 90's  Pt recently diagnosed with the flu  Pt denies any pain

## 2023-10-20 NOTE — Progress Notes (Signed)
 MEWS Progress Note  Patient Details Name: Lauren Baker MRN: [id number] DOB: [birth year] Today's Date: 10/20/2023   MEWS Flowsheet Documentation:  Assess: MEWS Score Temp: 97.7 F (36.5 C) BP: (!) 132/94 MAP (mmHg): 85 Pulse Rate: (!) 124 ECG Heart Rate: (!) 107 Resp: 20 Level of Consciousness: Alert SpO2: 95 % O2 Device: Nasal Cannula O2 Flow Rate (L/min): 5 L/min Assess: MEWS Score MEWS Temp: 0 MEWS Systolic: 0 MEWS Pulse: 2 MEWS RR: 0 MEWS LOC: 0 MEWS Score: 2 MEWS Score Color: Yellow Assess: SIRS CRITERIA SIRS Temperature : 0 SIRS Respirations : 0 SIRS Pulse: 1 SIRS WBC: 0 SIRS Score Sum : 1 Assess: if the MEWS score is Yellow or Red Were vital signs accurate and taken at a resting state?: Yes Does the patient meet 2 or more of the SIRS criteria?: Yes Does the patient have a confirmed or suspected source of infection?: No MEWS guidelines implemented : Yes, yellow Treat MEWS Interventions: Considered administering scheduled or prn medications/treatments as ordered Take Vital Signs Increase Vital Sign Frequency : Yellow: Q2hr x1, continue Q4hrs until patient remains green for 12hrs Escalate MEWS: Escalate: Yellow: Discuss with charge nurse and consider notifying provider and/or RRT        Burman Freestone 10/20/2023, 10:40 PM

## 2023-10-20 NOTE — Progress Notes (Addendum)
 Pharmacy Antibiotic Note  Lauren Baker is a 83 y.o. female admitted on 10/20/2023 with  shortness of breath .  Patient with acute on chronic respiratory failure secondary to flu pneumonitis. Pharmacy has been consulted for cefepime and vancomycin dosing.  Patient currently afebrile, wbc 2.1, scr appears normal.   Plan: Continue cefepime 2g IV q12 hours Vancomycin 1500mg  x1 then 1250 mg IV Q 24 hrs. Goal AUC 400-550. Expected AUC: 509 SCr used: 1.0   Height: 5\' 9"  (175.3 cm) Weight: 79.8 kg (175 lb 14.8 oz) IBW/kg (Calculated) : 66.2  Temp (24hrs), Avg:98.8 F (37.1 C), Min:98.8 F (37.1 C), Max:98.8 F (37.1 C)  Recent Labs  Lab 10/20/23 0517  WBC 2.1*  CREATININE 1.02*    Estimated Creatinine Clearance: 48.9 mL/min (A) (by C-G formula based on SCr of 1.02 mg/dL (H)).    No Known Allergies  Thank you for allowing pharmacy to be a part of this patient's care.  Sheppard Coil PharmD., BCPS Clinical Pharmacist 10/20/2023 8:14 AM

## 2023-10-21 DIAGNOSIS — J9601 Acute respiratory failure with hypoxia: Secondary | ICD-10-CM | POA: Diagnosis not present

## 2023-10-21 DIAGNOSIS — D61818 Other pancytopenia: Secondary | ICD-10-CM | POA: Diagnosis not present

## 2023-10-21 DIAGNOSIS — J09X1 Influenza due to identified novel influenza A virus with pneumonia: Secondary | ICD-10-CM | POA: Diagnosis not present

## 2023-10-21 LAB — CBC
HCT: 29.6 % — ABNORMAL LOW (ref 36.0–46.0)
Hemoglobin: 9.6 g/dL — ABNORMAL LOW (ref 12.0–15.0)
MCH: 31.6 pg (ref 26.0–34.0)
MCHC: 32.4 g/dL (ref 30.0–36.0)
MCV: 97.4 fL (ref 80.0–100.0)
Platelets: 88 10*3/uL — ABNORMAL LOW (ref 150–400)
RBC: 3.04 MIL/uL — ABNORMAL LOW (ref 3.87–5.11)
RDW: 15.8 % — ABNORMAL HIGH (ref 11.5–15.5)
WBC: 2.6 10*3/uL — ABNORMAL LOW (ref 4.0–10.5)
nRBC: 0 % (ref 0.0–0.2)

## 2023-10-21 LAB — MRSA NEXT GEN BY PCR, NASAL: MRSA by PCR Next Gen: NOT DETECTED

## 2023-10-21 LAB — GLUCOSE, CAPILLARY: Glucose-Capillary: 203 mg/dL — ABNORMAL HIGH (ref 70–99)

## 2023-10-21 LAB — BRAIN NATRIURETIC PEPTIDE: B Natriuretic Peptide: 713 pg/mL — ABNORMAL HIGH (ref 0.0–100.0)

## 2023-10-21 MED ORDER — DILTIAZEM HCL 25 MG/5ML IV SOLN
5.0000 mg | Freq: Once | INTRAVENOUS | Status: AC
  Administered 2023-10-21: 5 mg via INTRAVENOUS
  Filled 2023-10-21: qty 5

## 2023-10-21 MED ORDER — CHLORHEXIDINE GLUCONATE CLOTH 2 % EX PADS
6.0000 | MEDICATED_PAD | Freq: Every day | CUTANEOUS | Status: DC
  Administered 2023-10-21 – 2023-10-28 (×8): 6 via TOPICAL

## 2023-10-21 MED ORDER — AMIODARONE LOAD VIA INFUSION
150.0000 mg | Freq: Once | INTRAVENOUS | Status: AC
  Administered 2023-10-21: 150 mg via INTRAVENOUS
  Filled 2023-10-21: qty 83.34

## 2023-10-21 MED ORDER — FUROSEMIDE 10 MG/ML IJ SOLN
40.0000 mg | Freq: Once | INTRAMUSCULAR | Status: AC
  Administered 2023-10-21: 40 mg via INTRAVENOUS
  Filled 2023-10-21: qty 4

## 2023-10-21 MED ORDER — AMIODARONE HCL IN DEXTROSE 360-4.14 MG/200ML-% IV SOLN
30.0000 mg/h | INTRAVENOUS | Status: DC
Start: 2023-10-22 — End: 2023-10-23
  Administered 2023-10-22 (×3): 30 mg/h via INTRAVENOUS
  Filled 2023-10-21 (×2): qty 200

## 2023-10-21 MED ORDER — AMIODARONE HCL IN DEXTROSE 360-4.14 MG/200ML-% IV SOLN
60.0000 mg/h | INTRAVENOUS | Status: DC
  Administered 2023-10-21 – 2023-10-22 (×2): 60 mg/h via INTRAVENOUS
  Filled 2023-10-21 (×3): qty 200

## 2023-10-21 MED ORDER — DILTIAZEM HCL 25 MG/5ML IV SOLN
10.0000 mg | Freq: Once | INTRAVENOUS | Status: AC
  Administered 2023-10-21: 10 mg via INTRAVENOUS

## 2023-10-21 MED ORDER — SODIUM CHLORIDE 0.9 % IV BOLUS
250.0000 mL | Freq: Once | INTRAVENOUS | Status: AC
  Administered 2023-10-21: 250 mL via INTRAVENOUS

## 2023-10-21 MED ORDER — GUAIFENESIN-DM 100-10 MG/5ML PO SYRP
10.0000 mL | ORAL_SOLUTION | Freq: Three times a day (TID) | ORAL | Status: DC
  Administered 2023-10-21 – 2023-10-24 (×10): 10 mL via ORAL
  Filled 2023-10-21 (×10): qty 10

## 2023-10-21 NOTE — TOC Initial Note (Signed)
 Transition of Care [facility]) - Initial/Assessment Note    Patient Details  Name: Lauren Baker MRN: [id number] Date of Birth: 05/29/1941  [facility] CM/SW Contact:    Villa Herb, LCSWA Phone Number: 10/21/2023, 10:16 AM  Clinical Narrative:                 CSW notes pt arrived to ED from [facility]. CSW spoke to [place] with [facility] who states pt is a SNF resident. Pt will need PT eval, MD to order. Auth will be started once PT has seen. P[facility] states they are able to accept pt back once medically stable. TOC to follow.   Expected Discharge Plan: Skilled Nursing Facility Barriers to Discharge: Continued Medical Work up   Patient Goals and CMS Choice Patient states their goals for this hospitalization and ongoing recovery are:: go to SNF CMS Medicare.gov Compare Post Acute Care list provided to:: Patient Choice offered to / list presented to : Patient [place] ownership interest in [facility]provided to:: Patient    Expected Discharge Plan and Services In-house Referral: Clinical Social Work Discharge Planning Services: CM Consult Post Acute Care Choice: Skilled Nursing Facility Living arrangements for the past 2 months: Single Family Home                                      Prior Living Arrangements/Services Living arrangements for the past 2 months: Single Family Home Lives with:: Self Patient language and need for interpreter reviewed:: Yes Do you feel safe going back to the place where you live?: Yes      Need for Family Participation in Patient Care: Yes (Comment) Care giver support system in place?: Yes (comment) Current home services: DME Criminal Activity/Legal Involvement Pertinent to Current Situation/Hospitalization: No - Comment as needed  Activities of Daily Living   ADL Screening (condition at time of admission) Is the patient deaf or have difficulty hearing?: No Does the patient have difficulty seeing, even when wearing  glasses/contacts?: No Does the patient have difficulty concentrating, remembering, or making decisions?: Yes  Permission Sought/Granted                  Emotional Assessment Appearance:: Appears stated age Attitude/Demeanor/Rapport: Engaged Affect (typically observed): Accepting Orientation: : Oriented to Self, Oriented to Place, Oriented to  Time, Oriented to Situation Alcohol / Substance Use: Not Applicable Psych Involvement: No (comment)  Admission diagnosis:  Acute respiratory failure with hypoxia (HCC) [J96.01] Patient Active Problem List   Diagnosis Date Noted   Acute respiratory failure with hypoxia (HCC) 10/20/2023   Influenza A with pneumonia 10/20/2023   Pancytopenia (HCC) 10/20/2023   Chronic kidney disease, stage 3b (HCC) 10/20/2023   Unspecified protein-calorie malnutrition (HCC) 09/30/2023   Acquired hypothyroidism 09/29/2023   Depression, major, recurrent, severe with psychosis (HCC) 09/29/2023   Aortic atherosclerosis (HCC) 09/29/2023   Gastroesophageal reflux disease 09/24/2023   Respiratory failure with hypoxia (HCC) 09/21/2023   Pacemaker 03/04/2022   Chronic atrial fibrillation (HCC) 03/12/2018   Primary hypertension 03/12/2018   PCP:  Elfredia Nevins, MD Pharmacy:   [facility] [place], [place] - [address] [place] [address] Phone: [phone] Fax: [phone]     Social Drivers of Health (SDOH) Social History: SDOH Screenings   Food Insecurity: No Food Insecurity (10/20/2023)  Housing: Low Risk  (10/20/2023)  Transportation Needs: No Transportation  Needs (10/20/2023)  Utilities: Not At Risk (10/20/2023)  Social Connections: Socially Isolated (10/20/2023)  Tobacco Use: Low Risk  (10/20/2023)   SDOH Interventions:     Readmission Risk Interventions    10/21/2023   10:15 AM 09/23/2023    1:12 PM  Readmission Risk Prevention Plan  Medication Screening  Complete  Transportation Screening Complete  Complete  Home Care Screening Complete   Medication Review (RN CM) Complete

## 2023-10-21 NOTE — Progress Notes (Signed)
 PROGRESS NOTE    Patient: Lauren Baker                            PCP: Elfredia Nevins, MD                    DOB: [birth year]            DOA: 10/20/2023 JXB:[id number]             DOS: 10/21/2023, 10:59 AM   LOS: 1 day   Date of Service: The patient was seen and examined on 10/21/2023  Subjective:   The patient was seen and examined this morning. Awake alert following command, mildly tachycardic, heart rate 110, satting 91% on 5 L of oxygen Still complaining cough, shortness of breath with minimal exertion  Brief Narrative:   TERENA BOHAN is a  83 year old female with a history of permanent atrial fibrillation status post PPM, hypertension, hypothyroidism, depression/anxiety presenting with shortness of breath and hypoxia.  The patient had a recent hospital admission from 09/21/2023 to 09/24/2023 for acute respiratory failure.  The patient was initially started on antibiotics, but these were discontinued.  The patient was discharged with a prednisone taper for alveolitis.  She was discharged on 4 L.  She has been since weaned to 1.5 L. The patient had been doing fairly well up until 10/19/2023.  She had been making progress with her PT and ambulating with a walker over 100 feet.  She did develop some coughing and chest congestion.  In the evening of 10/19/2023, the patient began having worsening shortness of breath.  She was noted to be hypoxic with oxygen saturation in the 70s.  She was transferred to the emergency department further evaluation and treatment.    ED: the patient was afebrile.  She was tachycardic into the 110s.  She was hemodynamically stable.  Oxygen saturation initially was 94% on a nonrebreather mask.  She was weaned down to 5 L with saturation 94-95%.  The patient was given a DuoNeb.  WBC 2.1, hemoglobin 10.7, platelets 89.  Sodium 136, potassium 4.2, bicarbonate 27, serum creatinine 1.00.  AST 40, ALT 36, alkaline phosphatase 90, total bilirubin 2.1.  Chest x-ray  showed bilateral patchy opacities.  There is increased interstitial markings.  She was given vancomycin and cefepime.  She was admitted for further evaluation and treatment of her respiratory failure.    Assessment and Plan:  Acute on chronic respiratory failure with hypoxia -Influenza A positive -viral pneumonia -Respiratory failure due to  influenza pneumonitis -POA:  ON  on 5 L >>> still 5 L, satting 91% -As needed VBG -Wean oxygen back to baseline for saturation greater 92%  -CT chest-reviewed Patchy ground-glass density superimposed on interstitial thickening and bronchiectasis. Findings are not changed from comparison exam except for the effusions are slightly larger. Findings are favored to represent pulmonary edema. New bronchial mucoid material within the RIGHT bronchus intermedius. Stable prominent mediastinal lymph nodes. 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 -Lactic acid 1.1, 1.6, -Continue mucolytics, spirometer flutter valve, -Lasix 40 mg IV x 1 based on above findings   Influenza pneumonia -Continue oseltamavir  -Lactic acid 1.1, 1.6 -MRSA screen -start IVF   Bronchospasm/alveolitis -Start IV Solu-Medrol -Start Brovana -Continue DuoNebs   Permanent atrial fibrillation with RVR -Heart rate 81-1 24, currently 110 -Was initially started on metoprolol - Currently on Cardizem 60 mg p.o. every 6 -09/22/2023 echo 50-55%, indeterminate diastology,  normal RVF, mild MR -Continue Apixaban   Hypertension -Holding losartan -On metoprolol, diltiazem   Pancytopenia -Likely secondary to viral infection -Serum B12 522- normal -Serum folate 0.1- normal   CKD stage IIIb -Baseline creatinine 1,1-1.4 Stable  Hypothyroidism -Continue Synthroid   Depression -Continue Klonopin, will bupropion, Seroquel      Assessment & Plan:   Active Problems:   Primary hypertension   Acute respiratory failure with hypoxia (HCC)   Influenza A with pneumonia    Pancytopenia (HCC)   Chronic kidney disease, stage 3b (HCC)     Assessment and Plan: No notes have been filed under this hospital service. Service: Hospitalist           ----------------------------------------------------------------------------------------------------------------------------------------------- Nutritional status:  The patient's BMI is: Body mass index is 25.98 kg/m. I agree with the assessment and plan as outlined -------------------------------------------------------------------------------------------------------------------------------------  DVT prophylaxis:   apixaban (ELIQUIS) tablet 5 mg   Code Status:   Code Status: Full Code  Family Communication: No family member present at bedside- -Advance care planning has been discussed.   Admission status:   Status is: Inpatient Remains inpatient appropriate because: In acute respiratory failure, needing breathing treatments, supplemental oxygen-treated for influenza   Disposition: From  - home             Planning for discharge in 2 days: to Home ? HH   Procedures:   No admission procedures for hospital encounter.   Antimicrobials:  Anti-infectives (From admission, onward)    Start     Dose/Rate Route Frequency Ordered Stop   10/21/23 1000  vancomycin (VANCOREADY) IVPB 1250 mg/250 mL        1,250 mg 166.7 mL/hr over 90 Minutes Intravenous Every 24 hours 10/20/23 0832     10/20/23 2200  doxycycline (VIBRA-TABS) tablet 100 mg        100 mg Oral Every 12 hours 10/20/23 1526     10/20/23 2200  oseltamivir (TAMIFLU) capsule 30 mg       Placed in "Followed by" Linked Group   30 mg Oral 2 times daily 10/20/23 0832 10/25/23 0959   10/20/23 2000  ceFEPIme (MAXIPIME) 2 g in sodium chloride 0.9 % 100 mL IVPB        2 g 200 mL/hr over 30 Minutes Intravenous Every 12 hours 10/20/23 0815     10/20/23 1000  oseltamivir (TAMIFLU) capsule 30 mg  Status:  Discontinued        30 mg Oral 2 times daily  10/20/23 0829 10/20/23 0831   10/20/23 1000  oseltamivir (TAMIFLU) capsule 75 mg       Placed in "Followed by" Linked Group   75 mg Oral  Once 10/20/23 0832 10/20/23 0943   10/20/23 0900  vancomycin (VANCOREADY) IVPB 1500 mg/300 mL        1,500 mg 150 mL/hr over 120 Minutes Intravenous  Once 10/20/23 0832 10/20/23 1330   10/20/23 0645  vancomycin (VANCOREADY) IVPB 1500 mg/300 mL  Status:  Discontinued        1,500 mg 150 mL/hr over 120 Minutes Intravenous  Once 10/20/23 0630 10/20/23 0832   10/20/23 0630  vancomycin (VANCOCIN) IVPB 1000 mg/200 mL premix  Status:  Discontinued        1,000 mg 200 mL/hr over 60 Minutes Intravenous  Once 10/20/23 0628 10/20/23 0629   10/20/23 0630  ceFEPIme (MAXIPIME) 2 g in sodium chloride 0.9 % 100 mL IVPB        2 g 200 mL/hr over 30 Minutes  Intravenous  Once 10/20/23 0628 10/21/23 0831        Medication:   apixaban  5 mg Oral BID   arformoterol  15 mcg Nebulization BID   buPROPion  100 mg Oral TID   cholecalciferol  1,000 Units Oral Daily   clonazePAM  0.5 mg Oral BID   diltiazem  60 mg Oral Q6H   doxycycline  100 mg Oral Q12H   DULoxetine  30 mg Oral Daily   guaiFENesin-dextromethorphan  10 mL Oral Q8H   levalbuterol  0.63 mg Nebulization Q6H WA   levothyroxine  25 mcg Oral Q0600   methylPREDNISolone (SOLU-MEDROL) injection  60 mg Intravenous Q12H   metoprolol tartrate  5 mg Intravenous Q6H   metoprolol tartrate  25 mg Oral Q8H   oseltamivir  30 mg Oral BID   pantoprazole  40 mg Oral Daily   QUEtiapine  50 mg Oral QHS    acetaminophen **OR** acetaminophen, ondansetron **OR** ondansetron (ZOFRAN) IV   Objective:   Vitals:   10/21/23 0556 10/21/23 0602 10/21/23 0630 10/21/23 0743  BP: 119/75 119/75 110/71   Pulse: (!) 112  (!) 110   Resp:   18   Temp:   97.8 F (36.6 C)   TempSrc:   Oral   SpO2:    91%  Weight:      Height:        Intake/Output Summary (Last 24 hours) at 10/21/2023 1059 Last data filed at 10/21/2023  0800 Gross per 24 hour  Intake 2029.04 ml  Output 300 ml  Net 1729.04 ml   Filed Weights   10/20/23 0440  Weight: 79.8 kg     Physical examination:        General:  AAO x 3,  cooperative, SOB at rest requiring 5 L of oxygen  HEENT:  Normocephalic, PERRL, otherwise with in Normal limits   Neuro:  CNII-XII intact. , normal motor and sensation, reflexes intact   Lungs:   Clear to auscultation BL, Respirations unlabored,  No wheezes / crackles  Cardio:    S1/S2, RRR, No murmure, No Rubs or Gallops   Abdomen:  Soft, non-tender, bowel sounds active all four quadrants, no guarding or peritoneal signs.  Muscular  skeletal:  Limited exam -global generalized weaknesses - in bed, able to move all 4 extremities,   2+ pulses,  symmetric, No pitting edema  Skin:  Dry, warm to touch, negative for any Rashes,  Wounds: Please see nursing documentation         ------------------------------------------------------------------------------------------------------------------------------------------    LABs:     Latest Ref Rng & Units 10/21/2023    5:06 AM 10/20/2023    5:17 AM 09/22/2023    5:09 AM  CBC  WBC 4.0 - 10.5 K/uL 2.6  2.1  4.6   Hemoglobin 12.0 - 15.0 g/dL 9.6  40.9  81.1   Hematocrit 36.0 - 46.0 % 29.6  32.7  36.7   Platelets 150 - 400 K/uL 88  89  230       Latest Ref Rng & Units 10/20/2023    5:17 AM 10/08/2023    4:50 AM 09/23/2023    5:03 AM  CMP  Glucose 70 - 99 mg/dL 914  88  782   BUN 8 - 23 mg/dL 15  15  24    Creatinine 0.44 - 1.00 mg/dL 9.56  2.13  0.86   Sodium 135 - 145 mmol/L 136  140  132   Potassium 3.5 - 5.1 mmol/L 4.2  3.3  4.1   Chloride 98 - 111 mmol/L 100  104  99   CO2 22 - 32 mmol/L 27  32  24   Calcium 8.9 - 10.3 mg/dL 9.1  8.9  9.0   Total Protein 6.5 - 8.1 g/dL 5.8   6.1   Total Bilirubin 0.0 - 1.2 mg/dL 2.1   1.1   Alkaline Phos 38 - 126 U/L 98   89   AST 15 - 41 U/L 40   25   ALT 0 - 44 U/L 36   20        Micro Results Recent  Results (from the past 240 hours)  Resp Panel by RT-PCR (Flu A&B, Covid)     Status: Abnormal   Collection Time: 10/19/23  8:45 AM  Result Value Ref Range Status   SARS Coronavirus 2 by RT PCR NEGATIVE NEGATIVE Final    Comment: (NOTE) SARS-CoV-2 target nucleic acids are NOT DETECTED.  The SARS-CoV-2 RNA is generally detectable in upper respiratory specimens during the acute phase of infection. The lowest concentration of SARS-CoV-2 viral copies this assay can detect is 138 copies/mL. A negative result does not preclude SARS-Cov-2 infection and should not be used as the sole basis for treatment or other patient management decisions. A negative result may occur with  improper specimen collection/handling, submission of specimen other than nasopharyngeal swab, presence of viral mutation(s) within the areas targeted by this assay, and inadequate number of viral copies(<138 copies/mL). A negative result must be combined with clinical observations, patient history, and epidemiological information. The expected result is Negative.  Fact Sheet for Patients:  BloggerCourse.com  Fact Sheet for Healthcare Providers:  SeriousBroker.it  This test is no t yet approved or cleared by the [place] FDA and  has been authorized for detection and/or diagnosis of SARS-CoV-2 by FDA under an Emergency Use Authorization (EUA). This EUA will remain  in effect (meaning this test can be used) for the duration of the COVID-19 declaration under Section 564(b)(1) of the Act, 21 U.S.C.section 360bbb-3(b)(1), unless the authorization is terminated  or revoked sooner.       Influenza A by PCR POSITIVE (A) NEGATIVE Final   Influenza B by PCR NEGATIVE NEGATIVE Final    Comment: (NOTE) The Xpert Xpress SARS-CoV-2/FLU/RSV plus assay is intended as an aid in the diagnosis of influenza from Nasopharyngeal swab specimens and should not be used as a sole basis  for treatment. Nasal washings and aspirates are unacceptable for Xpert Xpress SARS-CoV-2/FLU/RSV testing.  Fact Sheet for Patients: BloggerCourse.com  Fact Sheet for Healthcare Providers: SeriousBroker.it  This test is not yet approved or cleared by the [place] FDA and has been authorized for detection and/or diagnosis of SARS-CoV-2 by FDA under an Emergency Use Authorization (EUA). This EUA will remain in effect (meaning this test can be used) for the duration of the COVID-19 declaration under Section 564(b)(1) of the Act, 21 U.S.C. section 360bbb-3(b)(1), unless the authorization is terminated or revoked.  Performed at [facility], [address] [place], [place] [address]   Resp panel by RT-PCR (RSV, Flu A&B, Covid) Anterior Nasal Swab     Status: Abnormal   Collection Time: 10/20/23  5:46 AM   Specimen: Anterior Nasal Swab  Result Value Ref Range Status   SARS Coronavirus 2 by RT PCR NEGATIVE NEGATIVE Final    Comment: (NOTE) SARS-CoV-2 target nucleic acids are NOT DETECTED.  The SARS-CoV-2 RNA is generally detectable in upper respiratory specimens during the  acute phase of infection. The lowest concentration of SARS-CoV-2 viral copies this assay can detect is 138 copies/mL. A negative result does not preclude SARS-Cov-2 infection and should not be used as the sole basis for treatment or other patient management decisions. A negative result may occur with  improper specimen collection/handling, submission of specimen other than nasopharyngeal swab, presence of viral mutation(s) within the areas targeted by this assay, and inadequate number of viral copies(<138 copies/mL). A negative result must be combined with clinical observations, patient history, and epidemiological information. The expected result is Negative.  Fact Sheet for Patients:  BloggerCourse.com  Fact Sheet for Healthcare  Providers:  SeriousBroker.it  This test is no t yet approved or cleared by the [place] FDA and  has been authorized for detection and/or diagnosis of SARS-CoV-2 by FDA under an Emergency Use Authorization (EUA). This EUA will remain  in effect (meaning this test can be used) for the duration of the COVID-19 declaration under Section 564(b)(1) of the Act, 21 U.S.C.section 360bbb-3(b)(1), unless the authorization is terminated  or revoked sooner.       Influenza A by PCR POSITIVE (A) NEGATIVE Final   Influenza B by PCR NEGATIVE NEGATIVE Final    Comment: (NOTE) The Xpert Xpress SARS-CoV-2/FLU/RSV plus assay is intended as an aid in the diagnosis of influenza from Nasopharyngeal swab specimens and should not be used as a sole basis for treatment. Nasal washings and aspirates are unacceptable for Xpert Xpress SARS-CoV-2/FLU/RSV testing.  Fact Sheet for Patients: BloggerCourse.com  Fact Sheet for Healthcare Providers: SeriousBroker.it  This test is not yet approved or cleared by the [place] FDA and has been authorized for detection and/or diagnosis of SARS-CoV-2 by FDA under an Emergency Use Authorization (EUA). This EUA will remain in effect (meaning this test can be used) for the duration of the COVID-19 declaration under Section 564(b)(1) of the Act, 21 U.S.C. section 360bbb-3(b)(1), unless the authorization is terminated or revoked.     Resp Syncytial Virus by PCR NEGATIVE NEGATIVE Final    Comment: (NOTE) Fact Sheet for Patients: BloggerCourse.com  Fact Sheet for Healthcare Providers: SeriousBroker.it  This test is not yet approved or cleared by the [place] FDA and has been authorized for detection and/or diagnosis of SARS-CoV-2 by FDA under an Emergency Use Authorization (EUA). This EUA will remain in effect (meaning this test  can be used) for the duration of the COVID-19 declaration under Section 564(b)(1) of the Act, 21 U.S.C. section 360bbb-3(b)(1), unless the authorization is terminated or revoked.  Performed at [facility], [address], [place], [place] [address]     Radiology Reports No results found.  SIGNED: Kendell Bane, MD, FHM. FAAFP. [facility] - [facility] Time spent - 55 min.  In seeing, evaluating and examining the patient. Reviewing medical records, labs, drawn plan of care. [facility],  Pager (please use [email or web address] to page/ text) Please use Epic Secure Chat for non-urgent communication (7AM-7PM)  If 7PM-7AM, please contact night-coverage [email or web address], 10/21/2023, 10:59 AM

## 2023-10-21 NOTE — Progress Notes (Signed)
 Patient has been afib running from 120s-160s for the majority of this shift. Patient has received all scheduled medications. MD has been made aware.

## 2023-10-21 NOTE — Progress Notes (Signed)
 MEWS Progress Note  Patient Details Name: MOLLEIGH HUOT MRN: [id number] DOB: 1941-06-27 Today's Date: 10/21/2023   MEWS Flowsheet Documentation:  Assess: MEWS Score Temp: (!) 97.2 F (36.2 C) BP: 119/75 MAP (mmHg): 88 Pulse Rate: (!) 112 ECG Heart Rate: (!) 107 Resp: 20 Level of Consciousness: Alert SpO2: 91 % O2 Device: Nasal Cannula O2 Flow Rate (L/min): 5 L/min Assess: MEWS Score MEWS Temp: 0 MEWS Systolic: 0 MEWS Pulse: 2 MEWS RR: 0 MEWS LOC: 0 MEWS Score: 2 MEWS Score Color: Yellow Assess: SIRS CRITERIA SIRS Temperature : 0 SIRS Respirations : 0 SIRS Pulse: 1 SIRS WBC: 0 SIRS Score Sum : 1 SIRS Temperature : 0 SIRS Pulse: 1 SIRS Respirations : 0 SIRS WBC: 0 SIRS Score Sum : 1 Assess: if the MEWS score is Yellow or Red Were vital signs accurate and taken at a resting state?: Yes Does the patient meet 2 or more of the SIRS criteria?: No Does the patient have a confirmed or suspected source of infection?: No MEWS guidelines implemented : No, previously yellow, continue vital signs every 4 hours Treat MEWS Interventions: Considered administering scheduled or prn medications/treatments as ordered Take Vital Signs Increase Vital Sign Frequency : Yellow: Q2hr x1, continue Q4hrs until patient remains green for 12hrs Escalate MEWS: Escalate: Yellow: Discuss with charge nurse and consider notifying provider and/or RRT        Burman Freestone 10/21/2023, 4:23 AM

## 2023-10-21 NOTE — Plan of Care (Signed)
   Problem: Health Behavior/Discharge Planning: Goal: Ability to manage health-related needs will improve Outcome: Progressing   Problem: Clinical Measurements: Goal: Ability to maintain clinical measurements within normal limits will improve Outcome: Progressing

## 2023-10-21 NOTE — Plan of Care (Addendum)
 RR discontinued ipratropium-albuterol due to it contributing to elevated heart rate. Pt experienced episodes of Afib. Gave po Cardizem and metoprolol tartrate per MD orders. On call MD and charge nurse was notified. This pt continues to be in [place] status until her BP and RR improve for 12 hours.    Problem: Health Behavior/Discharge Planning: Goal: Ability to manage health-related needs will improve Outcome: Progressing   Problem: Clinical Measurements: Goal: Ability to maintain clinical measurements within normal limits will improve Outcome: Progressing Goal: Will remain free from infection Outcome: Progressing Goal: Diagnostic test results will improve Outcome: Progressing Goal: Respiratory complications will improve Outcome: Progressing   Problem: Activity: Goal: Risk for activity intolerance will decrease Outcome: Progressing   Problem: Coping: Goal: Level of anxiety will decrease Outcome: Progressing   Problem: Elimination: Goal: Will not experience complications related to bowel motility Outcome: Progressing   Problem: Pain Managment: Goal: General experience of comfort will improve and/or be controlled Outcome: Progressing   Problem: Safety: Goal: Ability to remain free from injury will improve Outcome: Progressing   Problem: Skin Integrity: Goal: Risk for impaired skin integrity will decrease Outcome: Progressing

## 2023-10-22 DIAGNOSIS — A419 Sepsis, unspecified organism: Secondary | ICD-10-CM

## 2023-10-22 DIAGNOSIS — Z515 Encounter for palliative care: Secondary | ICD-10-CM | POA: Diagnosis not present

## 2023-10-22 DIAGNOSIS — R918 Other nonspecific abnormal finding of lung field: Secondary | ICD-10-CM

## 2023-10-22 DIAGNOSIS — R000 Tachycardia, unspecified: Secondary | ICD-10-CM

## 2023-10-22 DIAGNOSIS — N1832 Chronic kidney disease, stage 3b: Secondary | ICD-10-CM | POA: Diagnosis not present

## 2023-10-22 DIAGNOSIS — J1001 Influenza due to other identified influenza virus with the same other identified influenza virus pneumonia: Secondary | ICD-10-CM | POA: Diagnosis not present

## 2023-10-22 DIAGNOSIS — J479 Bronchiectasis, uncomplicated: Secondary | ICD-10-CM | POA: Diagnosis not present

## 2023-10-22 DIAGNOSIS — I5033 Acute on chronic diastolic (congestive) heart failure: Secondary | ICD-10-CM | POA: Diagnosis not present

## 2023-10-22 DIAGNOSIS — I5031 Acute diastolic (congestive) heart failure: Secondary | ICD-10-CM | POA: Diagnosis not present

## 2023-10-22 DIAGNOSIS — J09X1 Influenza due to identified novel influenza A virus with pneumonia: Secondary | ICD-10-CM | POA: Diagnosis not present

## 2023-10-22 DIAGNOSIS — J9601 Acute respiratory failure with hypoxia: Secondary | ICD-10-CM | POA: Diagnosis not present

## 2023-10-22 DIAGNOSIS — I4891 Unspecified atrial fibrillation: Secondary | ICD-10-CM | POA: Diagnosis not present

## 2023-10-22 DIAGNOSIS — E8729 Other acidosis: Secondary | ICD-10-CM

## 2023-10-22 DIAGNOSIS — Z66 Do not resuscitate: Secondary | ICD-10-CM | POA: Diagnosis not present

## 2023-10-22 LAB — BLOOD GAS, ARTERIAL
Acid-Base Excess: 6.9 mmol/L — ABNORMAL HIGH (ref 0.0–2.0)
Bicarbonate: 31.3 mmol/L — ABNORMAL HIGH (ref 20.0–28.0)
O2 Saturation: 92.8 %
Patient temperature: 36.9
pCO2 arterial: 43 mmHg (ref 32–48)
pH, Arterial: 7.47 — ABNORMAL HIGH (ref 7.35–7.45)
pO2, Arterial: 58 mmHg — ABNORMAL LOW (ref 83–108)

## 2023-10-22 LAB — CBC
HCT: 32 % — ABNORMAL LOW (ref 36.0–46.0)
Hemoglobin: 10.6 g/dL — ABNORMAL LOW (ref 12.0–15.0)
MCH: 31.8 pg (ref 26.0–34.0)
MCHC: 33.1 g/dL (ref 30.0–36.0)
MCV: 96.1 fL (ref 80.0–100.0)
Platelets: 91 10*3/uL — ABNORMAL LOW (ref 150–400)
RBC: 3.33 MIL/uL — ABNORMAL LOW (ref 3.87–5.11)
RDW: 15.9 % — ABNORMAL HIGH (ref 11.5–15.5)
WBC: 4.8 10*3/uL (ref 4.0–10.5)
nRBC: 0 % (ref 0.0–0.2)

## 2023-10-22 LAB — BASIC METABOLIC PANEL
Anion gap: 7 (ref 5–15)
BUN: 21 mg/dL (ref 8–23)
CO2: 27 mmol/L (ref 22–32)
Calcium: 8.8 mg/dL — ABNORMAL LOW (ref 8.9–10.3)
Chloride: 102 mmol/L (ref 98–111)
Creatinine, Ser: 0.83 mg/dL (ref 0.44–1.00)
GFR, Estimated: >60 mL/min (ref >60)
Glucose, Bld: 205 mg/dL — ABNORMAL HIGH (ref 70–99)
Potassium: 3.3 mmol/L — ABNORMAL LOW (ref 3.5–5.1)
Sodium: 136 mmol/L (ref 135–145)

## 2023-10-22 LAB — SEDIMENTATION RATE: Sed Rate: 45 mm/h — ABNORMAL HIGH (ref 0–22)

## 2023-10-22 LAB — CK: Total CK: 20 U/L — ABNORMAL LOW (ref 38–234)

## 2023-10-22 LAB — FERRITIN: Ferritin: 796 ng/mL — ABNORMAL HIGH (ref 11–307)

## 2023-10-22 LAB — PROCALCITONIN: Procalcitonin: 0.18 ng/mL

## 2023-10-22 LAB — LACTIC ACID, PLASMA: Lactic Acid, Venous: 4 mmol/L (ref 0.5–1.9)

## 2023-10-22 MED ORDER — SODIUM CHLORIDE 0.9 % IV SOLN
2.0000 g | Freq: Three times a day (TID) | INTRAVENOUS | Status: DC
  Administered 2023-10-22: 2 g via INTRAVENOUS
  Filled 2023-10-22: qty 12.5

## 2023-10-22 MED ORDER — HYDROXYZINE HCL 25 MG PO TABS
25.0000 mg | ORAL_TABLET | Freq: Three times a day (TID) | ORAL | Status: DC | PRN
Start: 2023-10-22 — End: 2023-10-28
  Administered 2023-10-22 – 2023-10-23 (×2): 25 mg via ORAL
  Filled 2023-10-22 (×2): qty 1

## 2023-10-22 MED ORDER — OSELTAMIVIR PHOSPHATE 75 MG PO CAPS
75.0000 mg | ORAL_CAPSULE | Freq: Two times a day (BID) | ORAL | Status: AC
  Administered 2023-10-22 – 2023-10-24 (×5): 75 mg via ORAL
  Filled 2023-10-22 (×5): qty 1

## 2023-10-22 MED ORDER — IPRATROPIUM BROMIDE 0.02 % IN SOLN: 0.5000 mg | Freq: Four times a day (QID) | RESPIRATORY_TRACT | Status: DC

## 2023-10-22 MED ORDER — CEFEPIME HCL 2 G IV SOLR
2.0000 g | Freq: Three times a day (TID) | INTRAVENOUS | Status: DC
  Administered 2023-10-22 – 2023-10-25 (×8): 2 g via INTRAVENOUS
  Filled 2023-10-22 (×9): qty 12.5

## 2023-10-22 MED ORDER — FUROSEMIDE 10 MG/ML IJ SOLN
40.0000 mg | Freq: Two times a day (BID) | INTRAMUSCULAR | Status: DC
  Administered 2023-10-22 – 2023-10-23 (×3): 40 mg via INTRAVENOUS
  Filled 2023-10-22 (×3): qty 4

## 2023-10-22 MED ORDER — BUDESONIDE 0.5 MG/2ML IN SUSP: 0.5000 mg | Freq: Two times a day (BID) | RESPIRATORY_TRACT | Status: DC

## 2023-10-22 MED ORDER — BUDESONIDE 0.5 MG/2ML IN SUSP
0.5000 mg | Freq: Two times a day (BID) | RESPIRATORY_TRACT | Status: DC
  Administered 2023-10-22 – 2023-10-27 (×10): 0.5 mg via RESPIRATORY_TRACT
  Filled 2023-10-22 (×11): qty 2

## 2023-10-22 MED ORDER — SODIUM CHLORIDE 3 % IN NEBU
4.0000 mL | INHALATION_SOLUTION | Freq: Two times a day (BID) | RESPIRATORY_TRACT | Status: DC
  Administered 2023-10-22: 4 mL via RESPIRATORY_TRACT
  Filled 2023-10-22: qty 4

## 2023-10-22 MED ORDER — REVEFENACIN 175 MCG/3ML IN SOLN
175.0000 ug | Freq: Every day | RESPIRATORY_TRACT | Status: DC
  Administered 2023-10-23 – 2023-10-27 (×5): 175 ug via RESPIRATORY_TRACT
  Filled 2023-10-22 (×6): qty 3

## 2023-10-22 MED ORDER — LEVALBUTEROL HCL 0.63 MG/3ML IN NEBU
0.6300 mg | INHALATION_SOLUTION | Freq: Four times a day (QID) | RESPIRATORY_TRACT | Status: DC | PRN
  Administered 2023-10-23: 0.63 mg via RESPIRATORY_TRACT
  Filled 2023-10-22: qty 3

## 2023-10-22 MED ORDER — LEVOFLOXACIN 500 MG PO TABS: 500.0000 mg | ORAL_TABLET | Freq: Every day | ORAL | Status: DC

## 2023-10-22 MED ORDER — POTASSIUM CHLORIDE CRYS ER 20 MEQ PO TBCR
40.0000 meq | EXTENDED_RELEASE_TABLET | Freq: Once | ORAL | Status: AC
  Administered 2023-10-22: 40 meq via ORAL
  Filled 2023-10-22: qty 2

## 2023-10-22 MED ORDER — METOPROLOL TARTRATE 25 MG PO TABS
37.5000 mg | ORAL_TABLET | Freq: Three times a day (TID) | ORAL | Status: DC
  Administered 2023-10-22 – 2023-10-23 (×3): 37.5 mg via ORAL
  Filled 2023-10-22 (×3): qty 2

## 2023-10-22 MED ORDER — RISAQUAD PO CAPS
2.0000 | ORAL_CAPSULE | Freq: Three times a day (TID) | ORAL | Status: DC
  Administered 2023-10-22 – 2023-10-27 (×14): 2 via ORAL
  Filled 2023-10-22 (×16): qty 2

## 2023-10-22 NOTE — Plan of Care (Signed)
  Problem: Health Behavior/Discharge Planning: Goal: Ability to manage health-related needs will improve Outcome: Progressing   Problem: Clinical Measurements: Goal: Ability to maintain clinical measurements within normal limits will improve Outcome: Progressing Goal: Will remain free from infection Outcome: Progressing Goal: Diagnostic test results will improve Outcome: Progressing Goal: Respiratory complications will improve Outcome: Not Progressing   Problem: Activity: Goal: Risk for activity intolerance will decrease Outcome: Not Progressing   Problem: Elimination: Goal: Will not experience complications related to bowel motility Outcome: Progressing Goal: Will not experience complications related to urinary retention Outcome: Progressing   Problem: Pain Managment: Goal: General experience of comfort will improve and/or be controlled Outcome: Progressing   Problem: Safety: Goal: Ability to remain free from injury will improve Outcome: Progressing   Problem: Skin Integrity: Goal: Risk for impaired skin integrity will decrease Outcome: Progressing

## 2023-10-22 NOTE — Progress Notes (Signed)
 PROGRESS NOTE    Patient: Lauren Baker                            PCP: Elfredia Nevins, MD                    DOB: [birth year]            DOA: 10/20/2023 MVH:[id number]             DOS: 10/22/2023, 1:02 PM   LOS: 2 days   Date of Service: The patient was seen and examined on 10/22/2023  Subjective:   The patient was seen and examined this morning.  Awake alert following command, up to 15 L of oxygen satting 91%, on amiodarone drip,  Overnight was transferred to ICU for A-fib with RVR, started on amiodarone drip.  Brief Narrative:   AZIE MCCONAHY is a  83 year old female with a history of permanent atrial fibrillation status post PPM, hypertension, hypothyroidism, depression/anxiety presenting with shortness of breath and hypoxia.  The patient had a recent hospital admission from 09/21/2023 to 09/24/2023 for acute respiratory failure.  The patient was initially started on antibiotics, but these were discontinued.  The patient was discharged with a prednisone taper for alveolitis.  She was discharged on 4 L.  She has been since weaned to 1.5 L. The patient had been doing fairly well up until 10/19/2023.  She had been making progress with her PT and ambulating with a walker over 100 feet.  She did develop some coughing and chest congestion.  In the evening of 10/19/2023, the patient began having worsening shortness of breath.  She was noted to be hypoxic with oxygen saturation in the 70s.  She was transferred to the emergency department further evaluation and treatment.    ED: the patient was afebrile.  She was tachycardic into the 110s.  She was hemodynamically stable.  Oxygen saturation initially was 94% on a nonrebreather mask.  She was weaned down to 5 L with saturation 94-95%.  The patient was given a DuoNeb.  WBC 2.1, hemoglobin 10.7, platelets 89.  Sodium 136, potassium 4.2, bicarbonate 27, serum creatinine 1.00.  AST 40, ALT 36, alkaline phosphatase 90, total bilirubin 2.1.  Chest x-ray  showed bilateral patchy opacities.  There is increased interstitial markings.  She was given vancomycin and cefepime.  She was admitted for further evaluation and treatment of her respiratory failure.    Assessment & Plan:   Active Problems:   Primary hypertension   Acute respiratory failure with hypoxia (HCC)   Influenza A with pneumonia   Pancytopenia (HCC)   Chronic kidney disease, stage 3b (HCC)   Assessment and Plan:  Acute on chronic respiratory failure with hypoxia -Influenza A positive -viral pneumonia Acute distress due to influenza,  -Respiratory failure due to  influenza pneumonitis -POA:  ON  on 5 L >>> 5 L, satting 91%>>> 13 L of oxygen, satting 91% -As needed VBG -Will to wean patient to lowest O2 demand possible maintaining O2 sat 88-92%  -CT chest-reviewed Patchy ground-glass density superimposed on interstitial thickening and bronchiectasis. Findings are not changed from comparison exam except for the effusions are slightly larger. Findings are favored to represent pulmonary edema. New bronchial mucoid material within the RIGHT bronchus intermedius. Stable prominent mediastinal lymph nodes. 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 -Lactic acid 1.1, 1.6, -Continue mucolytics, spirometer flutter valve,  -Continue Lasix 40 mg p.o. twice daily,  nebs, empiric antibiotics, steroids   Influenza pneumonia -Continue oseltamavir  -Lactic acid 1.1, 1.6 -MRSA discontinue vancomycin -Ending empiric antibiotics of cefepime Did IVF   Bronchospasm/alveolitis -Start IV Solu-Medrol -Start Brovana -Continue DuoNebs   Permanent atrial fibrillation with RVR -Fib with RVR, patient started on amiodarone drip overnight 10/22/2023 -PTA: Toprol-XL 200 mg p.o. daily, diltiazem CD3 00 mg PT daily Discontinued IV Lopressor-oral 37.5 mg every 8 hours, and Diltiazem 60 mg every 6 hours daily -Cardiology consulted: Patient is tolerating p.o., restarting metoprolol and  Cardizem titrating back to her baseline   -IV amiodarone drip with a goal to taper off as heart rate improves  -09/22/2023 echo 50-55%, indeterminate diastology, normal RVF, mild MR -Continue Apixaban   Hypertension -Holding losartan -On metoprolol, diltiazem   Pancytopenia -Likely secondary to viral infection -Serum B12 522- normal -Serum folate 0.1- normal   CKD stage IIIb -Baseline creatinine 1,1-1.4 Stable  Hypothyroidism -Continue Synthroid   Depression -Continue Klonopin, will bupropion, Seroquel     ----------------------------------------------------------------------------------------------------------------------------------------------- Nutritional status:  The patient's BMI is: Body mass index is 26.79 kg/m. I agree with the assessment and plan as outlined -------------------------------------------------------------------------------------------------------------------------------------  DVT prophylaxis:   apixaban (ELIQUIS) tablet 5 mg   Code Status:   Code Status: Full Code  Family Communication: No family member present at bedside- -Advance care planning has been discussed.   Admission status:   Status is: Inpatient Remains inpatient appropriate because: In acute respiratory failure, needing breathing treatments, supplemental oxygen-treated for influenza   Disposition: From  - home             Planning for discharge in 2 days: to Home ? HH   Procedures:   No admission procedures for hospital encounter.   Antimicrobials:  Anti-infectives (From admission, onward)    Start     Dose/Rate Route Frequency Ordered Stop   10/22/23 2200  oseltamivir (TAMIFLU) capsule 75 mg       Placed in "Followed by" Linked Group   75 mg Oral 2 times daily 10/22/23 0855 10/25/23 0959   10/22/23 1600  ceFEPIme (MAXIPIME) 2 g in sodium chloride 0.9 % 100 mL IVPB        2 g 200 mL/hr over 30 Minutes Intravenous Every 8 hours 10/22/23 0855     10/21/23 1000   vancomycin (VANCOREADY) IVPB 1250 mg/250 mL  Status:  Discontinued        1,250 mg 166.7 mL/hr over 90 Minutes Intravenous Every 24 hours 10/20/23 0832 10/22/23 0852   10/20/23 2200  doxycycline (VIBRA-TABS) tablet 100 mg        100 mg Oral Every 12 hours 10/20/23 1526     10/20/23 2200  oseltamivir (TAMIFLU) capsule 30 mg  Status:  Discontinued       Placed in "Followed by" Linked Group   30 mg Oral 2 times daily 10/20/23 0832 10/22/23 0855   10/20/23 2000  ceFEPIme (MAXIPIME) 2 g in sodium chloride 0.9 % 100 mL IVPB  Status:  Discontinued        2 g 200 mL/hr over 30 Minutes Intravenous Every 12 hours 10/20/23 0815 10/22/23 0855   10/20/23 1000  oseltamivir (TAMIFLU) capsule 30 mg  Status:  Discontinued        30 mg Oral 2 times daily 10/20/23 0829 10/20/23 0831   10/20/23 1000  oseltamivir (TAMIFLU) capsule 75 mg       Placed in "Followed by" Linked Group   75 mg Oral  Once 10/20/23 0832 10/20/23 2703  10/20/23 0900  vancomycin (VANCOREADY) IVPB 1500 mg/300 mL        1,500 mg 150 mL/hr over 120 Minutes Intravenous  Once 10/20/23 0832 10/21/23 1522   10/20/23 0645  vancomycin (VANCOREADY) IVPB 1500 mg/300 mL  Status:  Discontinued        1,500 mg 150 mL/hr over 120 Minutes Intravenous  Once 10/20/23 0630 10/20/23 0832   10/20/23 0630  vancomycin (VANCOCIN) IVPB 1000 mg/200 mL premix  Status:  Discontinued        1,000 mg 200 mL/hr over 60 Minutes Intravenous  Once 10/20/23 0628 10/20/23 0629   10/20/23 0630  ceFEPIme (MAXIPIME) 2 g in sodium chloride 0.9 % 100 mL IVPB        2 g 200 mL/hr over 30 Minutes Intravenous  Once 10/20/23 0628 10/21/23 0831        Medication:   apixaban  5 mg Oral BID   arformoterol  15 mcg Nebulization BID   buPROPion  100 mg Oral TID   Chlorhexidine Gluconate Cloth  6 each Topical Q0600   cholecalciferol  1,000 Units Oral Daily   clonazePAM  0.5 mg Oral BID   diltiazem  60 mg Oral Q6H   doxycycline  100 mg Oral Q12H   DULoxetine  30 mg Oral  Daily   furosemide  40 mg Intravenous BID   guaiFENesin-dextromethorphan  10 mL Oral Q8H   levalbuterol  0.63 mg Nebulization Q6H WA   levothyroxine  25 mcg Oral Q0600   methylPREDNISolone (SOLU-MEDROL) injection  60 mg Intravenous Q12H   metoprolol tartrate  37.5 mg Oral Q8H   oseltamivir  75 mg Oral BID   pantoprazole  40 mg Oral Daily   QUEtiapine  50 mg Oral QHS    acetaminophen **OR** acetaminophen, ondansetron **OR** ondansetron (ZOFRAN) IV   Objective:   Vitals:   10/22/23 1042 10/22/23 1100 10/22/23 1128 10/22/23 1155  BP:  (!) 107/57  (!) 107/57  Pulse:  (!) 104    Resp:  (!) 21    Temp:   97.7 F (36.5 C)   TempSrc:   Oral   SpO2: 90% 91%    Weight:      Height:        Intake/Output Summary (Last 24 hours) at 10/22/2023 1302 Last data filed at 10/22/2023 1228 Gross per 24 hour  Intake 1413.6 ml  Output 1000 ml  Net 413.6 ml   Filed Weights   10/20/23 0440 10/21/23 2300  Weight: 79.8 kg 82.3 kg     Physical examination:            General:  AAO x 3,  cooperative, worsening shortness of breath, requiring 12-15 L of oxygen currently   HEENT:  Normocephalic, PERRL, otherwise with in Normal limits   Neuro:  CNII-XII intact. , normal motor and sensation, reflexes intact   Lungs:   Clear to auscultation BL, Respirations unlabored,  No wheezes / crackles  Cardio:    S1/S2, irregularly irregular, tacky, No murmure, No Rubs or Gallops   Abdomen:  Soft, non-tender, bowel sounds active all four quadrants, no guarding or peritoneal signs.  Muscular  skeletal:  Limited exam -global generalized weaknesses - in bed, able to move all 4 extremities,   2+ pulses,  symmetric, No pitting edema  Skin:  Dry, warm to touch, negative for any Rashes,  Wounds: Please see nursing documentation          ------------------------------------------------------------------------------------------------------------------------------------------    LABs:  Latest  Ref Rng & Units 10/22/2023    4:49 AM 10/21/2023    5:06 AM 10/20/2023    5:17 AM  CBC  WBC 4.0 - 10.5 K/uL 4.8  2.6  2.1   Hemoglobin 12.0 - 15.0 g/dL 78.2  9.6  95.6   Hematocrit 36.0 - 46.0 % 32.0  29.6  32.7   Platelets 150 - 400 K/uL 91  88  89       Latest Ref Rng & Units 10/22/2023    4:49 AM 10/20/2023    5:17 AM 10/08/2023    4:50 AM  CMP  Glucose 70 - 99 mg/dL 213  086  88   BUN 8 - 23 mg/dL 21  15  15    Creatinine 0.44 - 1.00 mg/dL 5.78  4.69  6.29   Sodium 135 - 145 mmol/L 136  136  140   Potassium 3.5 - 5.1 mmol/L 3.3  4.2  3.3   Chloride 98 - 111 mmol/L 102  100  104   CO2 22 - 32 mmol/L 27  27  32   Calcium 8.9 - 10.3 mg/dL 8.8  9.1  8.9   Total Protein 6.5 - 8.1 g/dL  5.8    Total Bilirubin 0.0 - 1.2 mg/dL  2.1    Alkaline Phos 38 - 126 U/L  98    AST 15 - 41 U/L  40    ALT 0 - 44 U/L  36         Micro Results Recent Results (from the past 240 hours)  Resp Panel by RT-PCR (Flu A&B, Covid)     Status: Abnormal   Collection Time: 10/19/23  8:45 AM  Result Value Ref Range Status   SARS Coronavirus 2 by RT PCR NEGATIVE NEGATIVE Final    Comment: (NOTE) SARS-CoV-2 target nucleic acids are NOT DETECTED.  The SARS-CoV-2 RNA is generally detectable in upper respiratory specimens during the acute phase of infection. The lowest concentration of SARS-CoV-2 viral copies this assay can detect is 138 copies/mL. A negative result does not preclude SARS-Cov-2 infection and should not be used as the sole basis for treatment or other patient management decisions. A negative result may occur with  improper specimen collection/handling, submission of specimen other than nasopharyngeal swab, presence of viral mutation(s) within the areas targeted by this assay, and inadequate number of viral copies(<138 copies/mL). A negative result must be combined with clinical observations, patient history, and epidemiological information. The expected result is Negative.  Fact Sheet  for Patients:  BloggerCourse.com  Fact Sheet for Healthcare Providers:  SeriousBroker.it  This test is no t yet approved or cleared by the [place] FDA and  has been authorized for detection and/or diagnosis of SARS-CoV-2 by FDA under an Emergency Use Authorization (EUA). This EUA will remain  in effect (meaning this test can be used) for the duration of the COVID-19 declaration under Section 564(b)(1) of the Act, 21 U.S.C.section 360bbb-3(b)(1), unless the authorization is terminated  or revoked sooner.       Influenza A by PCR POSITIVE (A) NEGATIVE Final   Influenza B by PCR NEGATIVE NEGATIVE Final    Comment: (NOTE) The Xpert Xpress SARS-CoV-2/FLU/RSV plus assay is intended as an aid in the diagnosis of influenza from Nasopharyngeal swab specimens and should not be used as a sole basis for treatment. Nasal washings and aspirates are unacceptable for Xpert Xpress SARS-CoV-2/FLU/RSV testing.  Fact Sheet for Patients: BloggerCourse.com  Fact Sheet for Healthcare Providers: SeriousBroker.it  This test is not yet approved or cleared by the [place] and has been authorized for detection and/or diagnosis of SARS-CoV-2 by FDA under an Emergency Use Authorization (EUA). This EUA will remain in effect (meaning this test can be used) for the duration of the COVID-19 declaration under Section 564(b)(1) of the Act, 21 U.S.C. section 360bbb-3(b)(1), unless the authorization is terminated or revoked.  Performed at [facility], [address] [place], [place] [address]   Resp panel by RT-PCR (RSV, Flu A&B, Covid) Anterior Nasal Swab     Status: Abnormal   Collection Time: 10/20/23  5:46 AM   Specimen: Anterior Nasal Swab  Result Value Ref Range Status   SARS Coronavirus 2 by RT PCR NEGATIVE NEGATIVE Final    Comment: (NOTE) SARS-CoV-2 target nucleic acids are NOT  DETECTED.  The SARS-CoV-2 RNA is generally detectable in upper respiratory specimens during the acute phase of infection. The lowest concentration of SARS-CoV-2 viral copies this assay can detect is 138 copies/mL. A negative result does not preclude SARS-Cov-2 infection and should not be used as the sole basis for treatment or other patient management decisions. A negative result may occur with  improper specimen collection/handling, submission of specimen other than nasopharyngeal swab, presence of viral mutation(s) within the areas targeted by this assay, and inadequate number of viral copies(<138 copies/mL). A negative result must be combined with clinical observations, patient history, and epidemiological information. The expected result is Negative.  Fact Sheet for Patients:  BloggerCourse.com  Fact Sheet for Healthcare Providers:  SeriousBroker.it  This test is no t yet approved or cleared by the [place] FDA and  has been authorized for detection and/or diagnosis of SARS-CoV-2 by FDA under an Emergency Use Authorization (EUA). This EUA will remain  in effect (meaning this test can be used) for the duration of the COVID-19 declaration under Section 564(b)(1) of the Act, 21 U.S.C.section 360bbb-3(b)(1), unless the authorization is terminated  or revoked sooner.       Influenza A by PCR POSITIVE (A) NEGATIVE Final   Influenza B by PCR NEGATIVE NEGATIVE Final    Comment: (NOTE) The Xpert Xpress SARS-CoV-2/FLU/RSV plus assay is intended as an aid in the diagnosis of influenza from Nasopharyngeal swab specimens and should not be used as a sole basis for treatment. Nasal washings and aspirates are unacceptable for Xpert Xpress SARS-CoV-2/FLU/RSV testing.  Fact Sheet for Patients: BloggerCourse.com  Fact Sheet for Healthcare Providers: SeriousBroker.it  This test is not  yet approved or cleared by the [place] FDA and has been authorized for detection and/or diagnosis of SARS-CoV-2 by FDA under an Emergency Use Authorization (EUA). This EUA will remain in effect (meaning this test can be used) for the duration of the COVID-19 declaration under Section 564(b)(1) of the Act, 21 U.S.C. section 360bbb-3(b)(1), unless the authorization is terminated or revoked.     Resp Syncytial Virus by PCR NEGATIVE NEGATIVE Final    Comment: (NOTE) Fact Sheet for Patients: BloggerCourse.com  Fact Sheet for Healthcare Providers: SeriousBroker.it  This test is not yet approved or cleared by the [place] FDA and has been authorized for detection and/or diagnosis of SARS-CoV-2 by FDA under an Emergency Use Authorization (EUA). This EUA will remain in effect (meaning this test can be used) for the duration of the COVID-19 declaration under Section 564(b)(1) of the Act, 21 U.S.C. section 360bbb-3(b)(1), unless the authorization is terminated or revoked.  Performed at [facility], [address] [place], [place] [address]  MRSA Next Gen by PCR, Nasal     Status: None   Collection Time: 10/21/23  1:00 PM   Specimen: Nasal Mucosa; Nasal Swab  Result Value Ref Range Status   MRSA by PCR Next Gen NOT DETECTED NOT DETECTED Final    Comment: (NOTE) The GeneXpert MRSA Assay (FDA approved for NASAL specimens only), is one component of a comprehensive MRSA colonization surveillance program. It is not intended to diagnose MRSA infection nor to guide or monitor treatment for MRSA infections. Test performance is not FDA approved in patients less than 80 years old. Performed at [facility], [address] [place], [place] [address]     Radiology Reports No results found.  SIGNED: Kendell Bane, MD, FHM. FAAFP. [facility] - [facility] Critical care time spent - 55 min.  In seeing, evaluating and  examining the patient. Reviewing medical records, labs, drawn plan of care. Testing with cardiology Continue amiodarone drip   [facility],  Pager (please use [email or web address] to page/ text) Please use Epic Secure Chat for non-urgent communication (7AM-7PM)  If 7PM-7AM, please contact night-coverage [email or web address], 10/22/2023, 1:02 PM

## 2023-10-22 NOTE — Consult Note (Addendum)
 Cardiology Consultation   Patient ID: Lauren Baker MRN: [id number]; DOB: [birth year]  Admit date: 10/20/2023 Date of Consult: 10/22/2023  PCP:  Elfredia Nevins, MD   [facility] Providers Cardiologist:  Dina Rich, MD   {   Patient Profile:   Lauren Baker is a 83 y.o. 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 Flossie Dibble.  History of Present Illness:   [name] was recently admitted to [facility] from 2/10 - 09/24/2023 for acute hypoxic respiratory failure in the setting of multifocal pneumonia. Was treated with antibiotic therapy and from a cardiac perspective, she was continued on Cardizem CD 300 mg daily, Toprol-XL 200 mg daily and Eliquis 5 mg twice daily. Was discharged to the [facility].  She presented back to the ED on 10/20/2023 for worsening shortness of breath and hypoxia. Initial labs showed WBC 2.1, Hgb 10.7, platelets 89K, Na+ 136, K+ 4.2 and creatinine 1.02. BNP elevated at 859. Initial and repeat Hs troponin values negative at 11 and 12. Positive for influenza A. CXR showed pulmonary edema and small bilateral pleural effusions with atypical infection. CT Chest showed patchy groundglass density superimposed on interstitial thickening and bronchiectasis and overall favored to represent pulmonary edema. Did have new bronchial mucoid material along the right bronchus. EKG showed atrial fibrillation with RVR, heart rate 122 with ST depression along the inferior leads.  She was admitted for further management of acute on chronic hypoxic respiratory failure in the setting of influenza pneumonitis and started on Tamiflu along with IV steroids and scheduled nebulizers. In regards to her atrial fibrillation, she was initially started on Lopressor 25 mg every 8  hours and then was receiving IV Lopressor 5 mg every 6 hours as well. Is also receiving short acting Cardizem 60 mg every 6 hours. She was started on IV Amiodarone yesterday evening due to elevated rates by review of the [facility] but not listed in notes. BP has been stable at 109/81 - 143/77 within the past 24 hours.   In talking with the patient and her daughter-in-law today, she reports rehab had been going well at the [facility] until the day of her admission when she developed worsening dyspnea. Says everyone on her hall has the flu. No recent fever or chills. Denies any chest pain or palpitations. Transferred to the ICU last night due to tachycardia.    Past Medical History:  Diagnosis Date   Chronic atrial fibrillation (HCC)    Hypertension     Past Surgical History:  Procedure Laterality Date   detatched retina     PACEMAKER IMPLANT N/A 10/25/2018   Procedure: PACEMAKER IMPLANT;  Surgeon: Hillis Range, MD;  Location: [facility] INVASIVE CV LAB;  Service: Cardiovascular;  Laterality: N/A;     Home Medications:  Prior to Admission medications   Medication Sig Start Date End Date Taking? Authorizing Provider  acetaminophen (TYLENOL) 325 MG tablet Take 2 tablets (650 mg total) by mouth every 6 (six) hours as needed for mild pain (pain score 1-3) (or Fever >/= 101). 09/24/23  Yes Vassie Loll, MD  apixaban (ELIQUIS) 5 MG TABS tablet TAKE 1 TABLET BY MOUTH TWICE DAILY. 07/03/21  Yes Antoine Poche, MD  buPROPion (WELLBUTRIN) 100 MG tablet Take 100 mg by mouth 3 (three) times daily. 02/26/18  Yes [provider]  cholecalciferol (VITAMIN D) 1000 units tablet Take 1,000 Units by mouth daily.   Yes [provider]  clonazePAM (KLONOPIN) 0.5 MG tablet Take 0.5 mg by mouth 2 (two) times daily. 01/19/18  Yes [provider]  dextromethorphan-guaiFENesin (MUCINEX DM) 30-600 MG 12hr tablet Take 1 tablet by mouth 2 (two) times daily as needed for cough. 09/24/23  Yes Vassie Loll, MD  diltiazem (CARDIZEM CD) 300 MG 24 hr capsule Take 1 capsule (300 mg total) by mouth daily. 09/29/22  Yes BranchDorothe Pea, MD  DULoxetine (CYMBALTA) 30 MG capsule Take 30 mg by mouth daily.  12/13/17  Yes [provider]  levothyroxine (SYNTHROID) 25 MCG tablet Take 25 mcg by mouth daily. 11/27/21  Yes [provider]  losartan (COZAAR) 50 MG tablet Take 1 tablet (50 mg total) by mouth daily. 09/29/22  Yes BranchDorothe Pea, MD  metoprolol (TOPROL-XL) 200 MG 24 hr tablet Take 1 tablet (200 mg total) by mouth daily. 09/29/22  Yes BranchDorothe Pea, MD  omeprazole (PRILOSEC) 40 MG capsule Take 40 mg by mouth daily.   Yes [provider]  QUEtiapine (SEROQUEL) 50 MG tablet Take 50 mg by mouth at bedtime.  02/26/18  Yes [provider]    Inpatient Medications: Scheduled Meds:  apixaban  5 mg Oral BID   arformoterol  15 mcg Nebulization BID   buPROPion  100 mg Oral TID   Chlorhexidine Gluconate Cloth  6 each Topical Q0600   cholecalciferol  1,000 Units Oral Daily   clonazePAM  0.5 mg Oral BID   diltiazem  60 mg Oral Q6H   doxycycline  100 mg Oral Q12H   DULoxetine  30 mg Oral Daily   furosemide  40 mg Intravenous BID   guaiFENesin-dextromethorphan  10 mL Oral Q8H   levalbuterol  0.63 mg Nebulization Q6H WA   levothyroxine  25 mcg Oral Q0600   methylPREDNISolone (SOLU-MEDROL) injection  60 mg Intravenous Q12H   metoprolol tartrate  37.5 mg Oral Q8H   oseltamivir  30 mg Oral BID   pantoprazole  40 mg Oral Daily   potassium chloride  40 mEq Oral Once   QUEtiapine  50 mg Oral QHS   Continuous Infusions:  amiodarone 30 mg/hr (10/22/23 0729)   ceFEPime (MAXIPIME) IV 2 g (10/22/23 0821)   vancomycin 20 mL/hr at 10/21/23 1611   PRN Meds: acetaminophen **OR** acetaminophen, ondansetron **OR** ondansetron (ZOFRAN) IV  Allergies:   No Known Allergies  Social History:   Social History   Socioeconomic History   Marital status: Widowed     Spouse name: Not on file   Number of children: Not on file   Years of education: Not on file   Highest education level: Not on file  Occupational History   Not on file  Tobacco Use   Smoking status: Never   Smokeless tobacco: Never   Tobacco comments:    Has second hand exposure   Vaping Use   Vaping status: Never Used  Substance and Sexual Activity   Alcohol use: No   Drug use: No   Sexual activity: Not Currently  Other Topics Concern   Not on file  Social History Narrative   Not on file   Social Drivers of Health   Financial Resource Strain: Not on file  Food Insecurity: No Food Insecurity (10/20/2023)   Hunger Vital Sign    Worried About Running Out of Food in the Last Year: Never true    Ran Out  of Food in the Last Year: Never true  Transportation Needs: No Transportation Needs (10/20/2023)   PRAPARE - Administrator, Civil Service (Medical): No    Lack of Transportation (Non-Medical): No  Physical Activity: Not on file  Stress: Not on file  Social Connections: Socially Isolated (10/20/2023)   Social Connection and Isolation Panel [NHANES]    Frequency of Communication with Friends and Family: Never    Frequency of Social Gatherings with Friends and Family: Never    Attends Religious Services: Never    Database administrator or Organizations: Yes    Attends Engineer, structural: More than 4 times per year    Marital Status: Widowed  Intimate Partner Violence: Not At Risk (10/20/2023)   Humiliation, Afraid, Rape, and Kick questionnaire    Fear of Current or Ex-Partner: No    Emotionally Abused: No    Physically Abused: No    Sexually Abused: No    Family History:    Family History  Problem Relation Age of Onset   CVA Mother    Hypertension Sister    Heart attack Brother    Heart attack Sister    Cancer Sister      ROS:  Please see the history of present illness.   All other ROS reviewed and negative.     Physical Exam/Data:    Vitals:   10/22/23 0700 10/22/23 0800 10/22/23 0804 10/22/23 0834  BP: 121/84 120/89    Pulse: (!) 104 (!) 121    Resp: 20 19    Temp:   98 F (36.7 C)   TempSrc:   Oral   SpO2: 92% 90%  92%  Weight:      Height:        Intake/Output Summary (Last 24 hours) at 10/22/2023 0846 Last data filed at 10/22/2023 0729 Gross per 24 hour  Intake 1173.6 ml  Output --  Net 1173.6 ml      10/21/2023   11:00 PM 10/20/2023    4:40 AM 10/08/2023   11:39 AM  Last 3 Weights  Weight (lbs) 181 lb 7 oz 175 lb 14.8 oz 176 lb  Weight (kg) 82.3 kg 79.8 kg 79.833 kg     Body mass index is 26.79 kg/m.  General: Pleasant, elderly female appearing in no acute distress. HEENT: normal Neck: no JVD Vascular: No carotid bruits; Distal pulses 2+ bilaterally Cardiac:  normal S1, S2; Irregularly irregular Lungs: rales along bases bilaterally with occasional rhonchi.  Abd: soft, nontender, no hepatomegaly  Ext: trace lower extremity edema bilaterally.  Musculoskeletal:  No deformities, BUE and BLE strength normal and equal Skin: warm and dry  Neuro:  CNs 2-12 intact, no focal abnormalities noted Psych:  Normal affect   EKG:  The EKG was personally reviewed and demonstrates: Atrial fibrillation with RVR, heart rate 122 with ST depression along the inferior leads. Telemetry:  Telemetry was personally reviewed and demonstrates:  Atrial fibrillation with RVR, HR in 90's to 110's.   Relevant CV Studies:  Echocardiogram: 09/2023 IMPRESSIONS     1. Left ventricular ejection fraction, by estimation, is 55 to 60%. The  left ventricle has normal function. Left ventricular endocardial border  not optimally defined to evaluate regional wall motion. Indeterminate  diastolic filling due to E-A fusion.   2. Right ventricular systolic function is normal. The right ventricular  size is mildly enlarged.   3. Left atrial size was severely dilated.   4. Right atrial size was severely  dilated.   5. The mitral  valve is normal in structure. Mild mitral valve  regurgitation. No evidence of mitral stenosis.   6. The aortic valve is tricuspid. Aortic valve regurgitation is trivial.  No aortic stenosis is present.   Comparison(s): No significant change from prior study.    Laboratory Data:  High Sensitivity Troponin:   Recent Labs  Lab 10/20/23 0517 10/20/23 0707  TROPONINIHS 11 12     Chemistry Recent Labs  Lab 10/20/23 0517 10/22/23 0449  NA 136 136  K 4.2 3.3*  CL 100 102  CO2 27 27  GLUCOSE 133* 205*  BUN 15 21  CREATININE 1.02* 0.83  CALCIUM 9.1 8.8*  GFRNONAA 55* >60  ANIONGAP 9 7    Recent Labs  Lab 10/20/23 0517  PROT 5.8*  ALBUMIN 2.8*  AST 40  ALT 36  ALKPHOS 98  BILITOT 2.1*   Lipids No results for input(s): "CHOL", "TRIG", "HDL", "LABVLDL", "LDLCALC", "CHOLHDL" in the last 168 hours.  Hematology Recent Labs  Lab 10/20/23 0517 10/21/23 0506 10/22/23 0449  WBC 2.1* 2.6* 4.8  RBC 3.34* 3.04* 3.33*  HGB 10.7* 9.6* 10.6*  HCT 32.7* 29.6* 32.0*  MCV 97.9 97.4 96.1  MCH 32.0 31.6 31.8  MCHC 32.7 32.4 33.1  RDW 15.9* 15.8* 15.9*  PLT 89* 88* 91*   Thyroid No results for input(s): "TSH", "FREET4" in the last 168 hours.  BNP Recent Labs  Lab 10/20/23 0517 10/21/23 1556  BNP 859.0* 713.0*    DDimer No results for input(s): "DDIMER" in the last 168 hours.   Radiology/Studies:  CT CHEST WO CONTRAST Result Date: 10/20/2023 CLINICAL DATA:  Pneumonia. Abnormal chest radiograph. Shortness of breath EXAM: CT CHEST WITHOUT CONTRAST TECHNIQUE: Multidetector CT imaging of the chest was performed following the standard protocol without IV contrast. RADIATION DOSE REDUCTION: This exam was performed according to the departmental dose-optimization program which includes automated exposure control, adjustment of the mA and/or kV according to patient size and/or use of iterative reconstruction technique. COMPARISON:  CT 09/21/2023 FINDINGS: Cardiovascular: Coronary  artery calcification and aortic atherosclerotic calcification. No pericardial fluid. No acute findings. Mediastinum/Nodes: Scattered prominent mediastinal lymph nodes are similar prior. Largest node is subcarinal measuring 15 mm short axis compared to 12 mm on recent CT scan. Lungs/Pleura: There is patchy ground-glass density superimposed on interstitial thickening. There are mild bronchiectasis in the upper lobes. Small bilateral pleural effusions. Findings are not changed from comparison exam except for the effusions are slightly larger. There is bronchial mucoid material within the RIGHT bronchus intermedius (image 63/6). Upper Abdomen: Limited view of the liver, kidneys, pancreas are unremarkable. Normal adrenal glands. Musculoskeletal: No aggressive osseous lesion. 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 Kyphosis IMPRESSION: 1. Patchy ground-glass density superimposed on interstitial thickening and bronchiectasis. Findings are not changed from comparison exam except for the effusions are slightly larger. Findings are favored to represent pulmonary edema. 2. New bronchial mucoid material within the RIGHT bronchus intermedius. 3. Stable prominent mediastinal lymph nodes. 4. 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 5.  Aortic Atherosclerosis (ICD10-I70.0). Electronically Signed   By: Genevive Bi M.D.   On: 10/20/2023 14:37   DG Chest Port 1 View Result Date: 10/20/2023 CLINICAL DATA:  shob EXAM: PORTABLE CHEST 1 VIEW COMPARISON:  September 21, 2023 FINDINGS: The cardiomediastinal silhouette is unchanged in contour.Favored small bilateral pleural effusions superimposed on bibasilar pleural blunting. LEFT chest cardiac pacing device. No pneumothorax. Increased  diffuse interstitial markings with vascular indistinctness and peribronchial cuffing. Biapical pleuroparenchymal scarring. Hiatal hernia. IMPRESSION: Constellation of findings are  favored to reflect pulmonary edema with small bilateral pleural effusions. Atypical infection or outer other inflammatory processes could present similarly. Electronically Signed   By: Meda Klinefelter M.D.   On: 10/20/2023 07:54     Assessment and Plan:   1. Atrial Fibrillation with RVR - She has permanent atrial fibrillation and rates have been elevated this admission in the setting of Influenza A, pneumonitis and receiving IV fluids.  - She has been receiving PO Lopressor 25 mg Q8H,  IV Lopressor 5 mg Q6H and short-acting Cardizem 60mg  Q6H. Started on IV Amiodarone yesterday evening due to elevated rates. Was on high-dose AV nodal blocking agents PTA with Cardizem CD 300mg  daily and Toprol-XL 200mg  daily.  - Anticipate HR will improve with treatment of her underlying illness. Since able to take PO's, would stop IV Lopressor and titrate PO dosing to 37.5mg  Q8H. Can also titrate PO Cardizem and would plan to switch back to her long-acting medications prior to discharge. Only using IV Amiodarone for rate-control as she has permanent atrial fibrillation and would plan to stop within the next 24-48 hours pending HR response. Continue to hold Losartan to allow for titration of AV nodal blocking agents.  - Continue Eliquis 5mg  BID for anticoagulation.   2. Acute CHF Exacerbation - BNP at 859 on admission and 713 on 3/12. Chest CT consistent with pulmonary edema. Recent echo last month showed a preserved EF of 55-60% with normal RV function. Was started on IV Lasix 40mg  yesterday evening and scheduled to receive 40mg  BID today. Follow I&O's along with daily weights.   3. Tachy-brady Syndrome - She does have a Medtronic PPM in place which is followed by Dr. Nelly Laurence. Functioning normally by device check in 06/2023.  4. Influenza A/Pneumonitis - She is on Tamiflu, IV steroids, scheduled nebs and antibiotic therapy. Requiring 13 L HFNC currently. Management per the admitting team.   5. Hypokalemia - K+  at 3.3 today. Replacement already ordered by the admitting team.   Risk Assessment/Risk Scores:    CHA2DS2-VASc Score = 4   This indicates a 4.8% annual risk of stroke. The patient's score is based upon: CHF History: 0 HTN History: 1 Diabetes History: 0 Stroke History: 0 Vascular Disease History: 0 Age Score: 2 Gender Score: 1    For questions or updates, please contact [facility] Please consult [email or web address] for contact info under    Signed, Ellsworth Lennox, PA-C  10/22/2023 8:46 AM  Attending Note     Patient seen and discussed with PA [place] Ouch, I agree with her documentation. 83 yo female history of permanent afib with tachy/brady syndrome and pacemaker, HTN, admit 09/2023 with pneumonia, admitted with SOB. Patient influenza +, significant hypoxia. In this setting issues with afib with RVR, cardiology consulted to help manage     Na 136 K 4. 2 Cr 1.02 BUN 15 WBC 2.1 Hgb 10.7 Plt 89 BNP 859 Procalc 0.25 Lactic acid 1.1 Flu A + Trop 11-->12 EKG afib rate 122 CXR: suggests pulm edema, small bilateral effusions, possible atypcal infection CT chest: suggests pulm edema, patchy ground glass opacities   09/2023 echo: LVE 55-60%, indet diastolic function, normal RV function, severe BAE, mild MR     1.Afib with RVR - history of permanent afib  - in setting of influenza, severe hypoxia issues with afib with RVR - received IV dilt 10mg  then 5mg ,  as well as oral dilt 60mg  qid. Also several rounds of IV lopressor 5mg . Currentl on oral lopressor 25mg  tid.  - started on amio gtt by overnight team, suspect due to failure of rate control.  - on eliquis for stroke prevention   - overall rates are reasonable. Main drive for her tachycardia is her flu and severe hypoxia, even if she was in SR would expect HRs in 120s or so. Continue current regimen, short term amio just for additional rate control, likely d/c next 24 hrs. Room with bp to titrate av nodal agents as needed.     2. 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 Acute HFpEF - 09/2023 echo: LVE 55-60%, indet diastolic function, normal RV function, severe BAE, mild MR -CXR: suggests pulm edema, small bilateral effusions, possible atypcal infection -CT chest: suggests pulm edema, patchy ground glass opacities - BNP 859 - exacerbation likely related to infection, afib with RVR   - has received IV lasix 40mg  x 1 yesterday, scheduled for bid dosing today     4.Pancytopenia - admit WBC 2.1, Hgb 10.7, Plt 89 - continue eliquis as long as platelets above 50K       5. Influenza A - per primary team - significant hypoxka high O2 requirement, on 15L HFNC     Dina Rich MD

## 2023-10-22 NOTE — Consult Note (Signed)
 TELE-PCCM CONSULT This consult was provided via telemedicine with 2-way video and audio communication.  NAME:  Lauren Baker, MRN:  [id number], DOB:  1941-02-10, LOS: 2 ADMISSION DATE:  10/20/2023, CONSULTATION DATE:  10/22/23 REFERRING MD:  Nevin Bloodgood, MD CHIEF COMPLAINT:  Resp Failure/Flu A   History of Present Illness:  Lauren Baker is an 83 year old woman with atrial fibrillation and hypertension who is admitted to [facility] with acute hypoxemic respiratory failure due to influenza A pneumonia requiring 13-15L of oxygen.   PCCM consulted for further recommendations.  She is being treated with oseltamavir and empiric antibiotics with cefepime. Vancomycin discontinued as MRSA screen was negative. She is receiving IV solumedrol 60mg  IV twice daily and budesonide, brovana and atrovent nebs. She was started on amiodarone for atrial fibrillation with RVR which is better rate controlled. She has been consistently on eliquis as outpatient and remains on it since being in the hospital.   Spoke with patient and family via eLink camera.   Family reports she had second hand smoke from her husband for many years. Never smoked herself. Has otherwise been healthy and not been sick like this before. She has productive cough, some blood tinged secretions.  The patient had a recent hospital admission from 09/21/2023 to 09/24/2023 for acute respiratory failure.  The patient was initially started on antibiotics, but these were discontinued.  The patient was discharged with a prednisone taper for alveolitis.  She was discharged on 4 L.  She has been since weaned to 1.5 L. The patient had been doing fairly well up until 10/19/2023.  She had been making progress with her PT and ambulating with a walker over 100 feet.  She did develop some coughing and chest congestion.  In the evening of 10/19/2023, the patient began having worsening shortness of breath.  She was noted to be hypoxic with oxygen saturation in the  70s.  She was transferred to the emergency department further evaluation and treatment.  Pertinent  Medical History   Past Medical History:  Diagnosis Date   Chronic atrial fibrillation (HCC)    Hypertension      Significant Hospital Events: Including procedures, antibiotic start and stop dates in addition to other pertinent events   3/11 admitted to [facility] 3/13 PCCM consulted for increasing O2 needs.  Interim History / Subjective:   As above  Objective   Blood pressure (!) 139/91, pulse (!) 105, temperature 98.4 F (36.9 C), temperature source Oral, resp. rate (!) 27, height 5\' 9"  (1.753 m), weight 82.3 kg, SpO2 90%.        Intake/Output Summary (Last 24 hours) at 10/22/2023 1706 Last data filed at 10/22/2023 1605 Gross per 24 hour  Intake 833.72 ml  Output 1000 ml  Net -166.28 ml   Filed Weights   10/20/23 0440 10/21/23 2300  Weight: 79.8 kg 82.3 kg    Examination: General: elderly woman, in bed, no acute distress Respiratory: tachypnea, mild labored breathing   Lab/imaging review: ABG pH 7.47, pCO2 43, pO2 58 BNP 713 Lactic Acid 4 Hgb 10.6 Platlets 91 Procal 0.18  CTA Chest 10/20/23 Mediastinum/Nodes: Scattered prominent mediastinal lymph nodes are similar prior. Largest node is subcarinal measuring 15 mm short axis compared to 12 mm on recent CT scan.   Lungs/Pleura: There is patchy ground-glass density superimposed on interstitial thickening. There are mild bronchiectasis in the upper lobes. Small bilateral pleural effusions. Findings are not changed from comparison exam except for the effusions are slightly larger. There is  bronchial mucoid material within the RIGHT bronchus intermedius (image 63/6).  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 Mild  Bronchiectasis Tachycardia Lactic Acidosis  Plan: - Continue supplemental oxygen for goal SpO2 92% or higher - recommend transitioning to heated high flow nasal canula aiming for high flow rate to alleviate work of breathing - Continue budesonide brovana and yupelri nebs - PRN xopenex - hypertonic nebs twice daily for mucous clearance - check autoimmune panel for ILD evaluation - continue solumedrol 60mg  IV twice daily - check sputum culture - continue cefepime and oseltamivir - check EKG given on going tachycardia - patient to remain full code, if increasing O2 requirements occur and/or her work of breathing increases recommend bipap next and then intubation if needed.  Best Practice (right click and "Reselect all SmartList Selections" daily)   Per primary team  Labs   CBC: Recent Labs  Lab 10/20/23 0517 10/21/23 0506 10/22/23 0449  WBC 2.1* 2.6* 4.8  NEUTROABS 1.2*  --   --   HGB 10.7* 9.6* 10.6*  HCT 32.7* 29.6* 32.0*  MCV 97.9 97.4 96.1  PLT 89* 88* 91*    Basic Metabolic Panel: Recent Labs  Lab 10/20/23 0517 10/22/23 0449  NA 136 136  K 4.2 3.3*  CL 100 102  CO2 27 27  GLUCOSE 133* 205*  BUN 15 21  CREATININE 1.02* 0.83  CALCIUM 9.1 8.8*   GFR: Estimated Creatinine Clearance: 60.9 mL/min (by C-G formula based on SCr of 0.83 mg/dL). Recent Labs  Lab 10/20/23 0517 10/20/23 0812 10/20/23 1124 10/21/23 0506 10/22/23 0449 10/22/23 1404  PROCALCITON 0.25  --   --   --   --  0.18  WBC 2.1*  --   --  2.6* 4.8  --   LATICACIDVEN  --  1.1 1.6  --   --  4.0*    Liver Function Tests: Recent Labs  Lab 10/20/23 0517  AST 40  ALT 36  ALKPHOS 98  BILITOT 2.1*  PROT 5.8*  ALBUMIN 2.8*   No results for input(s): "LIPASE", "AMYLASE" in the last 168 hours. No results for input(s): "AMMONIA" in the last 168 hours.  ABG    Component Value Date/Time   PHART 7.47 (H) 10/22/2023 1640   PCO2ART 43 10/22/2023 1640   PO2ART 58 (L) 10/22/2023 1640   HCO3  31.3 (H) 10/22/2023 1640   TCO2 20 (L) 10/18/2018 2155   ACIDBASEDEF 4.3 (H) 10/19/2018 0325   O2SAT 92.8 10/22/2023 1640     Coagulation Profile: No results for input(s): "INR", "PROTIME" in the last 168 hours.  Cardiac Enzymes: No results for input(s): "CKTOTAL", "CKMB", "CKMBINDEX", "TROPONINI" in the last 168 hours.  HbA1C: No results found for: "HGBA1C"  CBG: Recent Labs  Lab 10/21/23 2050  GLUCAP 203*    Review of Systems:   Review of Systems  Constitutional:  Negative for chills, fever, malaise/fatigue and weight loss.  HENT:  Negative for congestion, sinus pain and sore throat.   Eyes: Negative.   Respiratory:  Positive for cough, sputum production and shortness of breath. Negative for hemoptysis and wheezing.   Cardiovascular:  Negative for chest pain, palpitations, orthopnea, claudication and leg swelling.  Gastrointestinal:  Negative for abdominal pain, heartburn, nausea and vomiting.  Genitourinary: Negative.   Musculoskeletal:  Negative for joint pain and myalgias.  Skin:  Negative for rash.  Neurological:  Negative for weakness.  Endo/Heme/Allergies: Negative.   Psychiatric/Behavioral: Negative.       Past Medical History:  She,  has a past medical history of Chronic atrial fibrillation (HCC) and Hypertension.   Surgical History:   Past Surgical History:  Procedure Laterality Date   detatched retina     PACEMAKER IMPLANT N/A 10/25/2018   Procedure: PACEMAKER IMPLANT;  Surgeon: Hillis Range, MD;  Location: [facility] INVASIVE CV LAB;  Service: Cardiovascular;  Laterality: N/A;     Social History:   reports that she has never smoked. She has never used smokeless tobacco. She reports that she does not drink alcohol and does not use drugs.   Family History:  Her family history includes CVA in her mother; Cancer in her sister; Heart attack in her brother and sister; Hypertension in her sister.   Allergies No Known Allergies   Home Medications  Prior to  Admission medications   Medication Sig Start Date End Date Taking? Authorizing Provider  acetaminophen (TYLENOL) 325 MG tablet Take 2 tablets (650 mg total) by mouth every 6 (six) hours as needed for mild pain (pain score 1-3) (or Fever >/= 101). 09/24/23  Yes Vassie Loll, MD  apixaban (ELIQUIS) 5 MG TABS tablet TAKE 1 TABLET BY MOUTH TWICE DAILY. 07/03/21  Yes Antoine Poche, MD  buPROPion (WELLBUTRIN) 100 MG tablet Take 100 mg by mouth 3 (three) times daily. 02/26/18  Yes [provider]  cholecalciferol (VITAMIN D) 1000 units tablet Take 1,000 Units by mouth daily.   Yes [provider]  clonazePAM (KLONOPIN) 0.5 MG tablet Take 0.5 mg by mouth 2 (two) times daily. 01/19/18  Yes [provider]  dextromethorphan-guaiFENesin (MUCINEX DM) 30-600 MG 12hr tablet Take 1 tablet by mouth 2 (two) times daily as needed for cough. 09/24/23  Yes Vassie Loll, MD  diltiazem (CARDIZEM CD) 300 MG 24 hr capsule Take 1 capsule (300 mg total) by mouth daily. 09/29/22  Yes BranchDorothe Pea, MD  DULoxetine (CYMBALTA) 30 MG capsule Take 30 mg by mouth daily.  12/13/17  Yes [provider]  levothyroxine (SYNTHROID) 25 MCG tablet Take 25 mcg by mouth daily. 11/27/21  Yes [provider]  losartan (COZAAR) 50 MG tablet Take 1 tablet (50 mg total) by mouth daily. 09/29/22  Yes BranchDorothe Pea, MD  metoprolol (TOPROL-XL) 200 MG 24 hr tablet Take 1 tablet (200 mg total) by mouth daily. 09/29/22  Yes BranchDorothe Pea, MD  omeprazole (PRILOSEC) 40 MG capsule Take 40 mg by mouth daily.   Yes [provider]  QUEtiapine (SEROQUEL) 50 MG tablet Take 50 mg by mouth at bedtime.  02/26/18  Yes [provider]     Total time: 40 minutes     Martina Sinner, MD 10/22/23 5:06 PM [facility]  For contact information, see Amion. If no response to pager, please call PCCM consult pager. After hours, 7PM- 7AM, please call Elink.

## 2023-10-23 ENCOUNTER — Inpatient Hospital Stay (HOSPITAL_COMMUNITY)

## 2023-10-23 DIAGNOSIS — J9601 Acute respiratory failure with hypoxia: Secondary | ICD-10-CM | POA: Diagnosis not present

## 2023-10-23 DIAGNOSIS — I10 Essential (primary) hypertension: Secondary | ICD-10-CM | POA: Diagnosis not present

## 2023-10-23 DIAGNOSIS — J09X1 Influenza due to identified novel influenza A virus with pneumonia: Secondary | ICD-10-CM | POA: Diagnosis not present

## 2023-10-23 DIAGNOSIS — I4891 Unspecified atrial fibrillation: Secondary | ICD-10-CM | POA: Diagnosis not present

## 2023-10-23 DIAGNOSIS — N1832 Chronic kidney disease, stage 3b: Secondary | ICD-10-CM | POA: Diagnosis not present

## 2023-10-23 DIAGNOSIS — J9621 Acute and chronic respiratory failure with hypoxia: Secondary | ICD-10-CM | POA: Diagnosis not present

## 2023-10-23 DIAGNOSIS — D61818 Other pancytopenia: Secondary | ICD-10-CM | POA: Diagnosis not present

## 2023-10-23 DIAGNOSIS — I5031 Acute diastolic (congestive) heart failure: Secondary | ICD-10-CM | POA: Diagnosis not present

## 2023-10-23 LAB — CBC
HCT: 32.3 % — ABNORMAL LOW (ref 36.0–46.0)
Hemoglobin: 10.6 g/dL — ABNORMAL LOW (ref 12.0–15.0)
MCH: 31.5 pg (ref 26.0–34.0)
MCHC: 32.8 g/dL (ref 30.0–36.0)
MCV: 95.8 fL (ref 80.0–100.0)
Platelets: 105 10*3/uL — ABNORMAL LOW (ref 150–400)
RBC: 3.37 MIL/uL — ABNORMAL LOW (ref 3.87–5.11)
RDW: 15.9 % — ABNORMAL HIGH (ref 11.5–15.5)
WBC: 5.9 10*3/uL (ref 4.0–10.5)
nRBC: 0.3 % — ABNORMAL HIGH (ref 0.0–0.2)

## 2023-10-23 LAB — C-REACTIVE PROTEIN: CRP: 4.8 mg/dL — ABNORMAL HIGH (ref <1.0)

## 2023-10-23 LAB — BASIC METABOLIC PANEL
Anion gap: 11 (ref 5–15)
BUN: 26 mg/dL — ABNORMAL HIGH (ref 8–23)
CO2: 28 mmol/L (ref 22–32)
Calcium: 8.9 mg/dL (ref 8.9–10.3)
Chloride: 96 mmol/L — ABNORMAL LOW (ref 98–111)
Creatinine, Ser: 0.84 mg/dL (ref 0.44–1.00)
GFR, Estimated: >60 mL/min (ref >60)
Glucose, Bld: 230 mg/dL — ABNORMAL HIGH (ref 70–99)
Potassium: 3.4 mmol/L — ABNORMAL LOW (ref 3.5–5.1)
Sodium: 135 mmol/L (ref 135–145)

## 2023-10-23 LAB — BLOOD GAS, ARTERIAL
Acid-Base Excess: 4.2 mmol/L — ABNORMAL HIGH (ref 0.0–2.0)
Bicarbonate: 28.4 mmol/L — ABNORMAL HIGH (ref 20.0–28.0)
Drawn by: 27016
FIO2: 60 %
O2 Saturation: 93.9 %
Patient temperature: 36.4
pCO2 arterial: 39 mmHg (ref 32–48)
pH, Arterial: 7.47 — ABNORMAL HIGH (ref 7.35–7.45)
pO2, Arterial: 59 mmHg — ABNORMAL LOW (ref 83–108)

## 2023-10-23 MED ORDER — DILTIAZEM HCL-DEXTROSE 125-5 MG/125ML-% IV SOLN (PREMIX)
5.0000 mg/h | INTRAVENOUS | Status: DC
  Administered 2023-10-23: 5 mg/h via INTRAVENOUS
  Administered 2023-10-24 – 2023-10-27 (×11): 15 mg/h via INTRAVENOUS
  Filled 2023-10-23 (×14): qty 125

## 2023-10-23 MED ORDER — POTASSIUM CHLORIDE CRYS ER 20 MEQ PO TBCR
40.0000 meq | EXTENDED_RELEASE_TABLET | Freq: Once | ORAL | Status: AC
  Administered 2023-10-23: 40 meq via ORAL
  Filled 2023-10-23: qty 2

## 2023-10-23 MED ORDER — FUROSEMIDE 10 MG/ML IJ SOLN
20.0000 mg | Freq: Once | INTRAMUSCULAR | Status: AC
  Administered 2023-10-23: 20 mg via INTRAVENOUS
  Filled 2023-10-23: qty 2

## 2023-10-23 MED ORDER — ENSURE ENLIVE PO LIQD
237.0000 mL | Freq: Two times a day (BID) | ORAL | Status: DC
  Administered 2023-10-23 – 2023-10-26 (×5): 237 mL via ORAL

## 2023-10-23 MED ORDER — FUROSEMIDE 10 MG/ML IJ SOLN
60.0000 mg | Freq: Two times a day (BID) | INTRAMUSCULAR | Status: DC
  Administered 2023-10-23 – 2023-10-24 (×3): 60 mg via INTRAVENOUS
  Filled 2023-10-23 (×3): qty 6

## 2023-10-23 MED ORDER — ALBUMIN HUMAN 25 % IV SOLN
25.0000 g | Freq: Four times a day (QID) | INTRAVENOUS | Status: AC
  Administered 2023-10-23 – 2023-10-24 (×4): 25 g via INTRAVENOUS
  Filled 2023-10-23 (×4): qty 100

## 2023-10-23 MED ORDER — METOPROLOL TARTRATE 25 MG PO TABS
50.0000 mg | ORAL_TABLET | Freq: Three times a day (TID) | ORAL | Status: DC
  Administered 2023-10-23 – 2023-10-25 (×5): 50 mg via ORAL
  Filled 2023-10-23 (×2): qty 1
  Filled 2023-10-23 (×3): qty 2

## 2023-10-23 MED ORDER — MAGNESIUM CITRATE PO SOLN
1.0000 | Freq: Once | ORAL | Status: AC
  Administered 2023-10-23: 1 via ORAL
  Filled 2023-10-23: qty 296

## 2023-10-23 MED ORDER — SENNOSIDES-DOCUSATE SODIUM 8.6-50 MG PO TABS
1.0000 | ORAL_TABLET | Freq: Two times a day (BID) | ORAL | Status: DC
  Administered 2023-10-23 – 2023-10-26 (×6): 1 via ORAL
  Filled 2023-10-23 (×6): qty 1

## 2023-10-23 NOTE — Progress Notes (Addendum)
 Rounding Note    Patient Name: Lauren Baker Date of Encounter: 10/23/2023  [facility] Cardiologist: Dina Rich, MD   Subjective   Ongoing SOB  Inpatient Medications    Scheduled Meds:  acidophilus  2 capsule Oral TID   apixaban  5 mg Oral BID   arformoterol  15 mcg Nebulization BID   budesonide (PULMICORT) nebulizer solution  0.5 mg Nebulization BID   buPROPion  100 mg Oral TID   Chlorhexidine Gluconate Cloth  6 each Topical Q0600   cholecalciferol  1,000 Units Oral Daily   clonazePAM  0.5 mg Oral BID   diltiazem  60 mg Oral Q6H   doxycycline  100 mg Oral Q12H   DULoxetine  30 mg Oral Daily   furosemide  40 mg Intravenous BID   guaiFENesin-dextromethorphan  10 mL Oral Q8H   levothyroxine  25 mcg Oral Q0600   methylPREDNISolone (SOLU-MEDROL) injection  60 mg Intravenous Q12H   metoprolol tartrate  37.5 mg Oral Q8H   oseltamivir  75 mg Oral BID   pantoprazole  40 mg Oral Daily   QUEtiapine  50 mg Oral QHS   revefenacin  175 mcg Nebulization Daily   sodium chloride HYPERTONIC  4 mL Nebulization BID   Continuous Infusions:  amiodarone 30 mg/hr (10/23/23 0657)   ceFEPime (MAXIPIME) IV 2 g (10/23/23 0754)   PRN Meds: acetaminophen **OR** acetaminophen, hydrOXYzine, levalbuterol, ondansetron **OR** ondansetron (ZOFRAN) IV   Vital Signs    Vitals:   10/23/23 0700 10/23/23 0724 10/23/23 0800 10/23/23 0804  BP: 119/82  119/80   Pulse:      Resp: (!) 29     Temp:    (!) 97.3 F (36.3 C)  TempSrc:    Axillary  SpO2:  94% 97%   Weight:      Height:        Intake/Output Summary (Last 24 hours) at 10/23/2023 0832 Last data filed at 10/23/2023 0657 Gross per 24 hour  Intake 839.13 ml  Output 2550 ml  Net -1710.87 ml      10/21/2023   11:00 PM 10/20/2023    4:40 AM 10/08/2023   11:39 AM  Last 3 Weights  Weight (lbs) 181 lb 7 oz 175 lb 14.8 oz 176 lb  Weight (kg) 82.3 kg 79.8 kg 79.833 kg      Telemetry    Afib 100-120s - Personally  Reviewed  ECG    na - Personally Reviewed  Physical Exam   GEN: No acute distress.   Neck: No JVD Cardiac: irreg Respiratory: coarse bilaterlaly GI: Soft, nontender, non-distended  MS: No edema; No deformity. Neuro:  Nonfocal  Psych: Normal affect   Labs    High Sensitivity Troponin:   Recent Labs  Lab 10/20/23 0517 10/20/23 0707  TROPONINIHS 11 12     Chemistry Recent Labs  Lab 10/20/23 0517 10/22/23 0449 10/23/23 0534  NA 136 136 135  K 4.2 3.3* 3.4*  CL 100 102 96*  CO2 27 27 28   GLUCOSE 133* 205* 230*  BUN 15 21 26*  CREATININE 1.02* 0.83 0.84  CALCIUM 9.1 8.8* 8.9  PROT 5.8*  --   --   ALBUMIN 2.8*  --   --   AST 40  --   --   ALT 36  --   --   ALKPHOS 98  --   --   BILITOT 2.1*  --   --   GFRNONAA 55* >60 >60  ANIONGAP 9 7  11    Lipids No results for input(s): "CHOL", "TRIG", "HDL", "LABVLDL", "LDLCALC", "CHOLHDL" in the last 168 hours.  Hematology Recent Labs  Lab 10/21/23 0506 10/22/23 0449 10/23/23 0534  WBC 2.6* 4.8 5.9  RBC 3.04* 3.33* 3.37*  HGB 9.6* 10.6* 10.6*  HCT 29.6* 32.0* 32.3*  MCV 97.4 96.1 95.8  MCH 31.6 31.8 31.5  MCHC 32.4 33.1 32.8  RDW 15.8* 15.9* 15.9*  PLT 88* 91* 105*   Thyroid No results for input(s): "TSH", "FREET4" in the last 168 hours.  BNP Recent Labs  Lab 10/20/23 0517 10/21/23 1556  BNP 859.0* 713.0*    DDimer No results for input(s): "DDIMER" in the last 168 hours.   Radiology    No results found.  Cardiac Studies     Patient Profile     Lauren Baker is a 83 y.o. 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 Flossie Dibble.   Assessment & Plan    1.Afib with RVR - history of permanent afib  - RVR in setting of influenza, severe hypoxia issues with afib with RVR - received IV dilt 10mg  then 5mg ,  as well as oral dilt 60mg  qid. Also several rounds of IV lopressor 5mg . Currentl on oral lopressor 25mg  tid.  - started on amio gtt by overnight team, suspect due to failure of rate control.   -she is on diltiazem 60mg  qid, lopressor 37.5mg  tid, amio gtt 30mg /hr - on eliquis for stroke prevention   - overall rates are reasonable. Main drive for her tachycardia is her flu and severe hypoxia, even if she was in SR would expect HRs in 100- 120s or so.  - d/c amio gtt. Can increase oral lopressor to 50mg  tid. Room to titrate av nodal agents further as long as bp tolerates, she is on  high doses at home chronically.    2. 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 Acute HFpEF - 09/2023 echo: LVE 55-60%, indet diastolic function, normal RV function, severe BAE, mild MR -CXR: suggests pulm edema, small bilateral effusions, possible atypcal infection -CT chest: suggests pulm edema, patchy ground glass opacities - BNP 859 - exacerbation likely related to infection, afib with RVR   -negative 1.2 L yesterday, total I/Os for admit are incomplete. She is on IV laix 40mg  bid, renal function is stable.  - certaintly not the major component in her severe hypoxia and O2 requirement as primary issue is pulmonary.  - continue IV diuresis today. Can increase to 60mg  bid today.      4.Pancytopenia - admit WBC 2.1, Hgb 10.7, Plt 89. Counts trending up from admission - continue eliquis as long as platelets above 50K       5. Influenza A - per primary team - significant hypoxka high O2 requirement, on HHFNC per pulmonary - virtual consult by pulm, based on lung findigs and degree of hypoxia have sent ILD panel  For questions or updates, please contact [facility] Please consult [email or web address] for contact info under        Signed, Dina Rich, MD  10/23/2023, 8:32 AM

## 2023-10-23 NOTE — Progress Notes (Signed)
 PROGRESS NOTE    Patient: Lauren Baker                            PCP: Elfredia Nevins, MD                    DOB: May 04, 1941            DOA: 10/20/2023 ZOX:[id number]             DOS: 10/23/2023, 10:49 AM   LOS: 3 days   Date of Service: The patient was seen and examined on 10/23/2023  Subjective:   The patient was seen and examined this morning, on high flow oxygen via nasal cannula Flow 25, FiO2 50%, satting 91% Awake alert, following command, heart rate 140s morning, mildly tachypneic at respiratory rate of 21, blood pressure stable 120/64  Critical care was consulted yesterday evening, following very closely   Brief Narrative:   Lauren Baker is a  83 year old female with a history of permanent atrial fibrillation status post PPM, hypertension, hypothyroidism, depression/anxiety presenting with shortness of breath and hypoxia.  The patient had a recent hospital admission from 09/21/2023 to 09/24/2023 for acute respiratory failure.  The patient was initially started on antibiotics, but these were discontinued.  The patient was discharged with a prednisone taper for alveolitis.  She was discharged on 4 L.  She has been since weaned to 1.5 L. The patient had been doing fairly well up until 10/19/2023.  She had been making progress with her PT and ambulating with a walker over 100 feet.  She did develop some coughing and chest congestion.  In the evening of 10/19/2023, the patient began having worsening shortness of breath.  She was noted to be hypoxic with oxygen saturation in the 70s.  She was transferred to the emergency department further evaluation and treatment.    ED: the patient was afebrile.  She was tachycardic into the 110s.  She was hemodynamically stable.  Oxygen saturation initially was 94% on a nonrebreather mask.  She was weaned down to 5 L with saturation 94-95%.  The patient was given a DuoNeb.  WBC 2.1, hemoglobin 10.7, platelets 89.  Sodium 136, potassium 4.2,  bicarbonate 27, serum creatinine 1.00.  AST 40, ALT 36, alkaline phosphatase 90, total bilirubin 2.1.  Chest x-ray showed bilateral patchy opacities.  There is increased interstitial markings.  She was given vancomycin and cefepime.  She was admitted for further evaluation and treatment of her respiratory failure.    Assessment & Plan:   Active Problems:   Primary hypertension   Acute respiratory failure with hypoxia (HCC)   Influenza A with pneumonia   Pancytopenia (HCC)   Chronic kidney disease, stage 3b (HCC)   Assessment and Plan:  Acute on chronic respiratory failure with hypoxia -Influenza A positive -viral pneumonia  -Remain in acute distress with respiratory failure, currently on high flow oxygen by nasal cannula rate of 25, FiO2 50%, satting 91%  ABG    Component Value Date/Time   PHART 7.47 (H) 10/23/2023 0936   PCO2ART 39 10/23/2023 0936   PO2ART 59 (L) 10/23/2023 0936   HCO3 28.4 (H) 10/23/2023 0936   TCO2 20 (L) 10/18/2018 2155   ACIDBASEDEF 4.3 (H) 10/19/2018 0325   O2SAT 93.9 10/23/2023 0936      -Respiratory failure due to  influenza pneumonitis -POA:  ON  on 5 L >>> 5 L, satting 91%>>> 13 L>> FiO2  50%, HHFNC >>  satting 91% -VBG, ABGs reviewed -Will to wean patient to lowest O2 demand possible maintaining O2 sat 88-92%  -CT chest-reviewed Patchy ground-glass density superimposed on interstitial thickening and bronchiectasis. Findings are not changed from comparison exam except for the effusions are slightly larger. Findings are favored to represent pulmonary edema. New bronchial mucoid material within the RIGHT bronchus intermedius. Stable prominent mediastinal lymph nodes. 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 -Lactic acid 1.1, 1.6, -Continue mucolytics, spirometer flutter valve,  -Resuming Lasix, nebs, empiric antibiotics, steroids  -PCCM Dr. Francine Graven was consulted on 10/22/2023-following closely  If patient's status worsens BiPAP or  requiring intubation-family PCCM agreed for patient to be transferred to [facility] ICU  -Discussed with daughter at bedside today agreeable to above plan       Influenza pneumonia - with Bronchospasm/alveolitis -Continue oseltamavir  -Lactic acid 1.1, 1.6 -MRSA discontinue vancomycin -Empiric antibiotic of cefepime   -Continue IV Solu-Medrol -Continue Brovana -Continue DuoNebs   Permanent atrial fibrillation with RVR -Fib with RVR, patient started on amiodarone drip overnight 10/22/2023 -PTA: Toprol-XL 200 mg p.o. daily, diltiazem CD3 00 mg PT daily Discontinued IV Lopressor-  -Amio gtt restarted overnight team, due to failure of rate control.    -she is on diltiazem 60mg  qid, lopressor 37.5mg  tid, amio gtt 30mg /hr - on eliquis for stroke prevention   -Cardiology Dr. Wyline Mood following closely, appreciate titration of medication   -09/22/2023 echo 50-55%, indeterminate diastology, normal RVF, mild MR -Continue Apixaban   Acute HFpEF  -Echo, reviewed as above reviewed as above EF 50-55%, -chest x-ray reviewed -positive for pulmonary edema, congestion -CT chest-groundglass appearance, pulmonary edema -BNP 859 -Trend I's and O's, daily weight, -Being IV Lasix 40 mg twice daily -Obtain renal function   hypertension -Holding losartan -On metoprolol, diltiazem   Pancytopenia -Likely secondary to viral infection -Serum B12 522- normal -Serum folate 0.1- normal   CKD stage IIIb -Baseline creatinine 1,1-1.4 Stable  Hypothyroidism -Continue Synthroid   Depression -Continue Klonopin, will bupropion, Seroquel     ----------------------------------------------------------------------------------------------------------------------------------------------- Nutritional status:  The patient's BMI is: Body mass index is 26.79 kg/m. I agree with the assessment and plan as  outlined -------------------------------------------------------------------------------------------------------------------------------------  DVT prophylaxis:   apixaban (ELIQUIS) tablet 5 mg   Code Status:   Code Status: Full Code  Family Communication: Family members, daughter, son updated at bedside -Advance care planning has been discussed.   Admission status:   Status is: Inpatient Remains inpatient appropriate because: In acute respiratory failure, needing breathing treatments, supplemental oxygen-treated for influenza   Disposition: From  - home             Planning for discharge in >3  days: to Home ? HH   Procedures:   No admission procedures for hospital encounter.   Antimicrobials:  Anti-infectives (From admission, onward)    Start     Dose/Rate Route Frequency Ordered Stop   10/23/23 0000  ceFEPIme (MAXIPIME) 2 g in sodium chloride 0.9 % 100 mL IVPB        2 g 200 mL/hr over 30 Minutes Intravenous Every 8 hours 10/22/23 1621     10/22/23 2200  oseltamivir (TAMIFLU) capsule 75 mg       Placed in "Followed by" Linked Group   75 mg Oral 2 times daily 10/22/23 0855 10/25/23 0959   10/22/23 1715  levofloxacin (LEVAQUIN) tablet 500 mg  Status:  Discontinued        500 mg Oral Daily 10/22/23 1619 10/22/23 1620  10/22/23 1600  ceFEPIme (MAXIPIME) 2 g in sodium chloride 0.9 % 100 mL IVPB  Status:  Discontinued        2 g 200 mL/hr over 30 Minutes Intravenous Every 8 hours 10/22/23 0855 10/22/23 1611   10/21/23 1000  vancomycin (VANCOREADY) IVPB 1250 mg/250 mL  Status:  Discontinued        1,250 mg 166.7 mL/hr over 90 Minutes Intravenous Every 24 hours 10/20/23 0832 10/22/23 0852   10/20/23 2200  doxycycline (VIBRA-TABS) tablet 100 mg        100 mg Oral Every 12 hours 10/20/23 1526     10/20/23 2200  oseltamivir (TAMIFLU) capsule 30 mg  Status:  Discontinued       Placed in "Followed by" Linked Group   30 mg Oral 2 times daily 10/20/23 0832 10/22/23 0855    10/20/23 2000  ceFEPIme (MAXIPIME) 2 g in sodium chloride 0.9 % 100 mL IVPB  Status:  Discontinued        2 g 200 mL/hr over 30 Minutes Intravenous Every 12 hours 10/20/23 0815 10/22/23 0855   10/20/23 1000  oseltamivir (TAMIFLU) capsule 30 mg  Status:  Discontinued        30 mg Oral 2 times daily 10/20/23 0829 10/20/23 0831   10/20/23 1000  oseltamivir (TAMIFLU) capsule 75 mg       Placed in "Followed by" Linked Group   75 mg Oral  Once 10/20/23 0832 10/20/23 0943   10/20/23 0900  vancomycin (VANCOREADY) IVPB 1500 mg/300 mL        1,500 mg 150 mL/hr over 120 Minutes Intravenous  Once 10/20/23 0832 10/21/23 1522   10/20/23 0645  vancomycin (VANCOREADY) IVPB 1500 mg/300 mL  Status:  Discontinued        1,500 mg 150 mL/hr over 120 Minutes Intravenous  Once 10/20/23 0630 10/20/23 0832   10/20/23 0630  vancomycin (VANCOCIN) IVPB 1000 mg/200 mL premix  Status:  Discontinued        1,000 mg 200 mL/hr over 60 Minutes Intravenous  Once 10/20/23 0628 10/20/23 0629   10/20/23 0630  ceFEPIme (MAXIPIME) 2 g in sodium chloride 0.9 % 100 mL IVPB        2 g 200 mL/hr over 30 Minutes Intravenous  Once 10/20/23 0628 10/21/23 0831        Medication:   acidophilus  2 capsule Oral TID   apixaban  5 mg Oral BID   arformoterol  15 mcg Nebulization BID   budesonide (PULMICORT) nebulizer solution  0.5 mg Nebulization BID   buPROPion  100 mg Oral TID   Chlorhexidine Gluconate Cloth  6 each Topical Q0600   cholecalciferol  1,000 Units Oral Daily   clonazePAM  0.5 mg Oral BID   diltiazem  60 mg Oral Q6H   doxycycline  100 mg Oral Q12H   DULoxetine  30 mg Oral Daily   furosemide  60 mg Intravenous BID   guaiFENesin-dextromethorphan  10 mL Oral Q8H   levothyroxine  25 mcg Oral Q0600   methylPREDNISolone (SOLU-MEDROL) injection  60 mg Intravenous Q12H   metoprolol tartrate  50 mg Oral Q8H   oseltamivir  75 mg Oral BID   pantoprazole  40 mg Oral Daily   potassium chloride  40 mEq Oral Once   QUEtiapine   50 mg Oral QHS   revefenacin  175 mcg Nebulization Daily   sodium chloride HYPERTONIC  4 mL Nebulization BID    acetaminophen **OR** acetaminophen, hydrOXYzine, levalbuterol, ondansetron **OR**  ondansetron (ZOFRAN) IV   Objective:   Vitals:   10/23/23 0800 10/23/23 0804 10/23/23 0900 10/23/23 1000  BP: 119/80  (!) 136/100 128/64  Pulse:      Resp: 20  (!) 21   Temp:  (!) 97.3 F (36.3 C)    TempSrc:  Axillary    SpO2: 97%   91%  Weight:      Height:        Intake/Output Summary (Last 24 hours) at 10/23/2023 1049 Last data filed at 10/23/2023 0657 Gross per 24 hour  Intake 839.13 ml  Output 1550 ml  Net -710.87 ml   Filed Weights   10/20/23 0440 10/21/23 2300  Weight: 79.8 kg 82.3 kg     Physical examination:          General:  AAO x 3,  cooperative, on high flow oxygen by nasal cannula,  HEENT:  Normocephalic, PERRL, otherwise with in Normal limits   Neuro:  CNII-XII intact. , normal motor and sensation, reflexes intact   Lungs:   Positive air sounds upper and mid lobe, reduced air sounds in lower lobes negative for any crackles, positive rhonchi, close diffusely  Cardio:    S1/S2, RRR, No murmure, No Rubs or Gallops   Abdomen:  Soft, non-tender, bowel sounds active all four quadrants, no guarding or peritoneal signs.  Muscular  skeletal:  Limited exam -global generalized weaknesses - in bed, able to move all 4 extremities,   2+ pulses,  symmetric, No pitting edema  Skin:  Dry, warm to touch, negative for any Rashes,  Wounds: Please see nursing documentation          ------------------------------------------------------------------------------------------------------------------------------------------    LABs:     Latest Ref Rng & Units 10/23/2023    5:34 AM 10/22/2023    4:49 AM 10/21/2023    5:06 AM  CBC  WBC 4.0 - 10.5 K/uL 5.9  4.8  2.6   Hemoglobin 12.0 - 15.0 g/dL 16.1  09.6  9.6   Hematocrit 36.0 - 46.0 % 32.3  32.0  29.6   Platelets  150 - 400 K/uL 105  91  88       Latest Ref Rng & Units 10/23/2023    5:34 AM 10/22/2023    4:49 AM 10/20/2023    5:17 AM  CMP  Glucose 70 - 99 mg/dL 045  409  811   BUN 8 - 23 mg/dL 26  21  15    Creatinine 0.44 - 1.00 mg/dL 9.14  7.82  9.56   Sodium 135 - 145 mmol/L 135  136  136   Potassium 3.5 - 5.1 mmol/L 3.4  3.3  4.2   Chloride 98 - 111 mmol/L 96  102  100   CO2 22 - 32 mmol/L 28  27  27    Calcium 8.9 - 10.3 mg/dL 8.9  8.8  9.1   Total Protein 6.5 - 8.1 g/dL   5.8   Total Bilirubin 0.0 - 1.2 mg/dL   2.1   Alkaline Phos 38 - 126 U/L   98   AST 15 - 41 U/L   40   ALT 0 - 44 U/L   36        Micro Results Recent Results (from the past 240 hours)  Resp Panel by RT-PCR (Flu A&B, Covid)     Status: Abnormal   Collection Time: 10/19/23  8:45 AM  Result Value Ref Range Status   SARS Coronavirus 2 by RT PCR NEGATIVE NEGATIVE Final  Comment: (NOTE) SARS-CoV-2 target nucleic acids are NOT DETECTED.  The SARS-CoV-2 RNA is generally detectable in upper respiratory specimens during the acute phase of infection. The lowest concentration of SARS-CoV-2 viral copies this assay can detect is 138 copies/mL. A negative result does not preclude SARS-Cov-2 infection and should not be used as the sole basis for treatment or other patient management decisions. A negative result may occur with  improper specimen collection/handling, submission of specimen other than nasopharyngeal swab, presence of viral mutation(s) within the areas targeted by this assay, and inadequate number of viral copies(<138 copies/mL). A negative result must be combined with clinical observations, patient history, and epidemiological information. The expected result is Negative.  Fact Sheet for Patients:  BloggerCourse.com  Fact Sheet for Healthcare Providers:  SeriousBroker.it  This test is no t yet approved or cleared by the [place] FDA and  has been  authorized for detection and/or diagnosis of SARS-CoV-2 by FDA under an Emergency Use Authorization (EUA). This EUA will remain  in effect (meaning this test can be used) for the duration of the COVID-19 declaration under Section 564(b)(1) of the Act, 21 U.S.C.section 360bbb-3(b)(1), unless the authorization is terminated  or revoked sooner.       Influenza A by PCR POSITIVE (A) NEGATIVE Final   Influenza B by PCR NEGATIVE NEGATIVE Final    Comment: (NOTE) The Xpert Xpress SARS-CoV-2/FLU/RSV plus assay is intended as an aid in the diagnosis of influenza from Nasopharyngeal swab specimens and should not be used as a sole basis for treatment. Nasal washings and aspirates are unacceptable for Xpert Xpress SARS-CoV-2/FLU/RSV testing.  Fact Sheet for Patients: BloggerCourse.com  Fact Sheet for Healthcare Providers: SeriousBroker.it  This test is not yet approved or cleared by the [place] FDA and has been authorized for detection and/or diagnosis of SARS-CoV-2 by FDA under an Emergency Use Authorization (EUA). This EUA will remain in effect (meaning this test can be used) for the duration of the COVID-19 declaration under Section 564(b)(1) of the Act, 21 U.S.C. section 360bbb-3(b)(1), unless the authorization is terminated or revoked.  Performed at [facility], [address], [place], [place] [address]   Resp panel by RT-PCR (RSV, Flu A&B, Covid) Anterior Nasal Swab     Status: Abnormal   Collection Time: 10/20/23  5:46 AM   Specimen: Anterior Nasal Swab  Result Value Ref Range Status   SARS Coronavirus 2 by RT PCR NEGATIVE NEGATIVE Final    Comment: (NOTE) SARS-CoV-2 target nucleic acids are NOT DETECTED.  The SARS-CoV-2 RNA is generally detectable in upper respiratory specimens during the acute phase of infection. The lowest concentration of SARS-CoV-2 viral copies this assay can detect is 138 copies/mL. A negative  result does not preclude SARS-Cov-2 infection and should not be used as the sole basis for treatment or other patient management decisions. A negative result may occur with  improper specimen collection/handling, submission of specimen other than nasopharyngeal swab, presence of viral mutation(s) within the areas targeted by this assay, and inadequate number of viral copies(<138 copies/mL). A negative result must be combined with clinical observations, patient history, and epidemiological information. The expected result is Negative.  Fact Sheet for Patients:  BloggerCourse.com  Fact Sheet for Healthcare Providers:  SeriousBroker.it  This test is no t yet approved or cleared by the [place] FDA and  has been authorized for detection and/or diagnosis of SARS-CoV-2 by FDA under an Emergency Use Authorization (EUA). This EUA will remain  in effect (meaning this test  can be used) for the duration of the COVID-19 declaration under Section 564(b)(1) of the Act, 21 U.S.C.section 360bbb-3(b)(1), unless the authorization is terminated  or revoked sooner.       Influenza A by PCR POSITIVE (A) NEGATIVE Final   Influenza B by PCR NEGATIVE NEGATIVE Final    Comment: (NOTE) The Xpert Xpress SARS-CoV-2/FLU/RSV plus assay is intended as an aid in the diagnosis of influenza from Nasopharyngeal swab specimens and should not be used as a sole basis for treatment. Nasal washings and aspirates are unacceptable for Xpert Xpress SARS-CoV-2/FLU/RSV testing.  Fact Sheet for Patients: BloggerCourse.com  Fact Sheet for Healthcare Providers: SeriousBroker.it  This test is not yet approved or cleared by the [place] FDA and has been authorized for detection and/or diagnosis of SARS-CoV-2 by FDA under an Emergency Use Authorization (EUA). This EUA will remain in effect (meaning this test can be  used) for the duration of the COVID-19 declaration under Section 564(b)(1) of the Act, 21 U.S.C. section 360bbb-3(b)(1), unless the authorization is terminated or revoked.     Resp Syncytial Virus by PCR NEGATIVE NEGATIVE Final    Comment: (NOTE) Fact Sheet for Patients: BloggerCourse.com  Fact Sheet for Healthcare Providers: SeriousBroker.it  This test is not yet approved or cleared by the [place] FDA and has been authorized for detection and/or diagnosis of SARS-CoV-2 by FDA under an Emergency Use Authorization (EUA). This EUA will remain in effect (meaning this test can be used) for the duration of the COVID-19 declaration under Section 564(b)(1) of the Act, 21 U.S.C. section 360bbb-3(b)(1), unless the authorization is terminated or revoked.  Performed at [facility], [address], [place], [place] [address]   MRSA Next Gen by PCR, Nasal     Status: None   Collection Time: 10/21/23  1:00 PM   Specimen: Nasal Mucosa; Nasal Swab  Result Value Ref Range Status   MRSA by PCR Next Gen NOT DETECTED NOT DETECTED Final    Comment: (NOTE) The GeneXpert MRSA Assay (FDA approved for NASAL specimens only), is one component of a comprehensive MRSA colonization surveillance program. It is not intended to diagnose MRSA infection nor to guide or monitor treatment for MRSA infections. Test performance is not FDA approved in patients less than 42 years old. Performed at [facility], [address], [place], [place] [address]     Radiology Reports No results found.  SIGNED: Kendell Bane, MD, FHM. FAAFP. [facility] - [facility] Critical care time spent - 75 min.  In seeing, evaluating and examining the patient. Reviewing medical records, labs, drawn plan of care. Discussing with cardiology, pulmonary critical care team, ICU staff Continue amiodarone drip, respiratory support via high flow nasal cannula, IV  meds   [facility],  Pager (please use [email or web address] to page/ text) Please use Epic Secure Chat for non-urgent communication (7AM-7PM)  If 7PM-7AM, please contact night-coverage [email or web address], 10/23/2023, 10:49 AM

## 2023-10-23 NOTE — Consult Note (Signed)
 TELE-PCCM CONSULT This consult was provided via telemedicine with 2-way video and audio communication.  NAME:  Lauren Baker, MRN:  [id number], DOB:  07-Apr-1941, LOS: 3 ADMISSION DATE:  10/20/2023, CONSULTATION DATE:  10/22/23 REFERRING MD:  Nevin Bloodgood, MD CHIEF COMPLAINT:  Resp Failure/Flu A   History of Present Illness:  Lauren Baker is an 83 year old woman with atrial fibrillation and hypertension who is admitted to [facility] with acute hypoxemic respiratory failure due to influenza A pneumonia requiring 13-15L of oxygen.   PCCM consulted for further recommendations.  She is being treated with oseltamavir and empiric antibiotics with cefepime. Vancomycin discontinued as MRSA screen was negative. She is receiving IV solumedrol 60mg  IV twice daily and budesonide, brovana and atrovent nebs. She was started on amiodarone for atrial fibrillation with RVR which is better rate controlled. She has been consistently on eliquis as outpatient and remains on it since being in the hospital.   Spoke with patient and family via eLink camera.   Family reports she had second hand smoke from her husband for many years. Never smoked herself. Has otherwise been healthy and not been sick like this before. She has productive cough, some blood tinged secretions.  The patient had a recent hospital admission from 09/21/2023 to 09/24/2023 for acute respiratory failure.  The patient was initially started on antibiotics, but these were discontinued.  The patient was discharged with a prednisone taper for alveolitis.  She was discharged on 4 L.  She has been since weaned to 1.5 L. The patient had been doing fairly well up until 10/19/2023.  She had been making progress with her PT and ambulating with a walker over 100 feet.  She did develop some coughing and chest congestion.  In the evening of 10/19/2023, the patient began having worsening shortness of breath.  She was noted to be hypoxic with oxygen saturation in the  70s.  She was transferred to the emergency department further evaluation and treatment.  Pertinent  Medical History   Past Medical History:  Diagnosis Date   Chronic atrial fibrillation (HCC)    Hypertension      Significant Hospital Events: Including procedures, antibiotic start and stop dates in addition to other pertinent events   3/11 admitted to [facility] 3/13 PCCM consulted for increasing O2 needs.  Interim History / Subjective:  No events. Stable day Remains afib and variable RVR depending on resp status  Objective   Blood pressure 128/64, pulse (!) 104, temperature (!) 97.3 F (36.3 C), temperature source Axillary, resp. rate (!) 21, height 5\' 9"  (1.753 m), weight 82.3 kg, SpO2 91%.    FiO2 (%):  [50 %-100 %] 50 %   Intake/Output Summary (Last 24 hours) at 10/23/2023 1116 Last data filed at 10/23/2023 2952 Gross per 24 hour  Intake 839.13 ml  Output 1550 ml  Net -710.87 ml   Filed Weights   10/20/23 0440 10/21/23 2300  Weight: 79.8 kg 82.3 kg    Examination: No distress Family at bedside Moves to command Breathing pattern nonlabored, mildly tachypneic  Pct pretty low Imaging stable ARDS pattern  Resolved Hospital Problem list     Assessment & Plan:  Acute on chronic hypoxemic respiratory failure- admit for this last month with GGO sent home on steroid taper now back with recurrent hypoxemia; distribution and severity if anything looks better.  I think treating this as COP vs. NSIP and fluid overload are probably most reasonable.   - Albumin, lasix, steroids, tamiflu - Equivocal  role for abx given neg pct - Encourage PO, protein shakes - Up to chair - Encourage IS - Nebs are fine, no role for hypertonic - Speech to see if any s/s of aspiration that we may be missing although distribution on CT is atypical - Updated family, will call in again this weekend - If ends up intubated should send to [facility] for bronch and ARDS-type vent management; do not think she  is on that trajectory at present  Best Practice (right click and "Reselect all SmartList Selections" daily)   Per primary team  33 min cc time Myrla Halsted MD PCCM

## 2023-10-23 NOTE — Progress Notes (Signed)
 PT Cancellation Note  Patient Details Name: Lauren Baker MRN: [id number] DOB: July 17, 1941   Cancelled Treatment:    Reason Eval/Treat Not Completed: Medical issues which prohibited therapy.  Patient on 20 LPM high flow O2, held per RN request.  Will check back when patient medically stable.   2:47 PM, 10/23/23 Ocie Bob, MPT Physical Therapist with [facility] [phone] office [phone] mobile phone

## 2023-10-23 NOTE — Progress Notes (Signed)
 PCCM Interval Progress Note:  83 year old female with history of atrial fibrillation, hypertension, admitted to [facility] initially on 3/11 with influenza and respiratory failure.  PCCM has been consulted over the past few days for increasing oxygen requirements and recommendations.  She is currently being treated with Tamiflu and empiric antibiotics with cefepime.  Already on IV steroids, budesonide, Brovana, Atrovent.  PCCM evaluated the patient earlier today and recommended albumin, Lasix along with her Tamiflu and steroids.  Recommended that if she were to be intubated should be transferred to [facility] for bronchoscopy and arts type vent management.  Afternoon of 3/14 family requesting that the patient be transferred to [facility] and again PCCM was consulted.  On arrival, patient is awake (but sleepy), alert, oriented. She follows commands. On BiPAP and breathing comfortably. No indication for intubation at this time.   Plan  AoC hypoxic respiratory failure 2/2 influenza pneumonia and fluid volume overload  - continuous BiPAP for now until AM  - ABG in AM  - con't tamiflu  - con't steroids  - ?dc antibiotics in AM given procal negative - con't albumin, lasix for fluid removal (clinically appears euvolemic)  - f/u autoimmune profile  - yupelri, pulmicort, brovana   Atrial fibrillation with RVR  - con't cardizem gtt  - con't metoprolol 50mg  q8h  - con't eliquis  - tele monitoring  - cards following appreciate recs   Acute on chronic HFpEF  HTN Likely worsened with acute influenza infection - albumin and lasix  - strict I/O  - daily weights  - cards following, appreciate recs  - con't metop/dilt as above   Pancytopenia  - resolving, likely in setting of acute illness, bone marrow suppression  - monitor  CC time: 32  Lauren Peru, PA-C [facility] 10/24/23 12:32 AM  Please see [email or web address] for pager details.  From 7A-7P if no response, please call  [phone] After hours, please call ELink [phone]

## 2023-10-23 NOTE — Plan of Care (Signed)
  Problem: Health Behavior/Discharge Planning: Goal: Ability to manage health-related needs will improve Outcome: Progressing   Problem: Clinical Measurements: Goal: Ability to maintain clinical measurements within normal limits will improve Outcome: Progressing Goal: Will remain free from infection Outcome: Progressing Goal: Diagnostic test results will improve Outcome: Progressing Goal: Respiratory complications will improve Outcome: Progressing   Problem: Activity: Goal: Risk for activity intolerance will decrease Outcome: Progressing   Problem: Coping: Goal: Level of anxiety will decrease Outcome: Progressing   Problem: Elimination: Goal: Will not experience complications related to bowel motility Outcome: Progressing Goal: Will not experience complications related to urinary retention Outcome: Progressing   Problem: Pain Managment: Goal: General experience of comfort will improve and/or be controlled Outcome: Progressing   Problem: Safety: Goal: Ability to remain free from injury will improve Outcome: Progressing   Problem: Skin Integrity: Goal: Risk for impaired skin integrity will decrease Outcome: Progressing

## 2023-10-23 NOTE — Progress Notes (Signed)
  Acute on chronic respiratory failure with hypoxia due to influenza A-viral pneumonia  Blood pressure 139/62, pulse (!) 113, temperature 98 F (36.7 C), temperature source Oral, resp. rate (!) 31, height 5\' 9"  (1.753 m), weight 82.3 kg,  PO2 97%.  On high flow heated oxygen nasal cannula FiO2 60%, O2 flow rate of 30  ABG    Component Value Date/Time   PHART 7.47 (H) 10/23/2023 0936   PCO2ART 39 10/23/2023 0936   PO2ART 59 (L) 10/23/2023 0936   HCO3 28.4 (H) 10/23/2023 0936   TCO2 20 (L) 10/18/2018 2155   ACIDBASEDEF 4.3 (H) 10/19/2018 0325   O2SAT 93.9 10/23/2023 0936   Discussed with PCCM, continue high flow heated oxygen via nasal cannula currently O2 fluid 30, FiO2 of 60% satting 97%  Patient remained awake alert PCCM recommended: Additional dose of steroids, Lasix, albumin,, continuing IV steroids nebs and Tamiflu.  Patient is to decline, proceed with intubation and transfer to [facility]    Currently;  Family requesting to be transferred to [facility] -   Discussed with PCCM Dr. Myrla Halsted -agreed the patient needs to be transferred,  with low threshold to be transferred to ICU-if you have progressive decline.   Discussed with flow manager, [facility], admission team at [facility]. Agreed that the patient is appropriate for progressive unit.    Patient will transfer to [facility] to progressive unit under care of TRH team-with PCCM consult      SIGNED: Kendell Bane, MD, FHM. FAAFP Critical care time 35 additional minutes spent in seeing evaluate patient Discussing with critical care team Dr. Myrla Halsted to transfer the patient to [facility]  [facility],  Pager (please use [email or web address] to page/text)  Please use Epic Secure Chat for non-urgent communication (7AM-7PM) If 7PM-7AM, please contact night-coverage [email or web address],  10/23/2023, 1:29 PM

## 2023-10-24 ENCOUNTER — Inpatient Hospital Stay (HOSPITAL_COMMUNITY)

## 2023-10-24 DIAGNOSIS — J9601 Acute respiratory failure with hypoxia: Secondary | ICD-10-CM | POA: Diagnosis not present

## 2023-10-24 DIAGNOSIS — I4891 Unspecified atrial fibrillation: Secondary | ICD-10-CM | POA: Diagnosis not present

## 2023-10-24 DIAGNOSIS — J09X1 Influenza due to identified novel influenza A virus with pneumonia: Secondary | ICD-10-CM | POA: Diagnosis not present

## 2023-10-24 DIAGNOSIS — D61818 Other pancytopenia: Secondary | ICD-10-CM | POA: Diagnosis not present

## 2023-10-24 DIAGNOSIS — I5030 Unspecified diastolic (congestive) heart failure: Secondary | ICD-10-CM

## 2023-10-24 DIAGNOSIS — N1832 Chronic kidney disease, stage 3b: Secondary | ICD-10-CM | POA: Diagnosis not present

## 2023-10-24 LAB — POCT I-STAT 7, (LYTES, BLD GAS, ICA,H+H)
Acid-Base Excess: 8 mmol/L — ABNORMAL HIGH (ref 0.0–2.0)
Bicarbonate: 31.9 mmol/L — ABNORMAL HIGH (ref 20.0–28.0)
Calcium, Ion: 1.17 mmol/L (ref 1.15–1.40)
HCT: 28 % — ABNORMAL LOW (ref 36.0–46.0)
Hemoglobin: 9.5 g/dL — ABNORMAL LOW (ref 12.0–15.0)
O2 Saturation: 98 %
Patient temperature: 36.7
Potassium: 3.4 mmol/L — ABNORMAL LOW (ref 3.5–5.1)
Sodium: 138 mmol/L (ref 135–145)
TCO2: 33 mmol/L — ABNORMAL HIGH (ref 22–32)
pCO2 arterial: 40.8 mmHg (ref 32–48)
pH, Arterial: 7.5 — ABNORMAL HIGH (ref 7.35–7.45)
pO2, Arterial: 90 mmHg (ref 83–108)

## 2023-10-24 LAB — RHEUMATOID FACTOR: Rheumatoid fact SerPl-aCnc: 13.7 [IU]/mL (ref <14.0)

## 2023-10-24 LAB — GLUCOSE, CAPILLARY
Glucose-Capillary: 143 mg/dL — ABNORMAL HIGH (ref 70–99)
Glucose-Capillary: 175 mg/dL — ABNORMAL HIGH (ref 70–99)
Glucose-Capillary: 201 mg/dL — ABNORMAL HIGH (ref 70–99)
Glucose-Capillary: 217 mg/dL — ABNORMAL HIGH (ref 70–99)
Glucose-Capillary: 224 mg/dL — ABNORMAL HIGH (ref 70–99)
Glucose-Capillary: 261 mg/dL — ABNORMAL HIGH (ref 70–99)

## 2023-10-24 LAB — HEMOGLOBIN A1C
Hgb A1c MFr Bld: 6.4 % — ABNORMAL HIGH (ref 4.8–5.6)
Mean Plasma Glucose: 136.98 mg/dL

## 2023-10-24 LAB — C4 COMPLEMENT: Complement C4, Body Fluid: 32 mg/dL (ref 12–38)

## 2023-10-24 LAB — BRAIN NATRIURETIC PEPTIDE: B Natriuretic Peptide: 673.4 pg/mL — ABNORMAL HIGH (ref 0.0–100.0)

## 2023-10-24 LAB — MAGNESIUM: Magnesium: 2.2 mg/dL (ref 1.7–2.4)

## 2023-10-24 LAB — ANTI-SCLERODERMA ANTIBODY: Scleroderma (Scl-70) (ENA) Antibody, IgG: <0.2 AI (ref 0.0–0.9)

## 2023-10-24 LAB — C3 COMPLEMENT: C3 Complement: 140 mg/dL (ref 82–167)

## 2023-10-24 MED ORDER — POLYETHYLENE GLYCOL 3350 17 G PO PACK: 17.0000 g | PACK | Freq: Every day | ORAL | Status: DC | PRN

## 2023-10-24 MED ORDER — PREDNISONE 20 MG PO TABS: 50.0000 mg | ORAL_TABLET | Freq: Every day | ORAL | Status: DC

## 2023-10-24 MED ORDER — PREDNISONE 20 MG PO TABS: 20.0000 mg | ORAL_TABLET | Freq: Every day | ORAL | Status: DC

## 2023-10-24 MED ORDER — POTASSIUM CHLORIDE 20 MEQ PO PACK
40.0000 meq | PACK | Freq: Two times a day (BID) | ORAL | Status: DC
Start: 2023-10-25 — End: 2023-10-25

## 2023-10-24 MED ORDER — PREDNISONE 20 MG PO TABS: 40.0000 mg | ORAL_TABLET | Freq: Every day | ORAL | Status: DC

## 2023-10-24 MED ORDER — METHYLPREDNISOLONE SODIUM SUCC 125 MG IJ SOLR
60.0000 mg | Freq: Two times a day (BID) | INTRAMUSCULAR | Status: AC
  Administered 2023-10-24 – 2023-10-25 (×2): 60 mg via INTRAVENOUS
  Filled 2023-10-24 (×2): qty 2

## 2023-10-24 MED ORDER — SODIUM CHLORIDE 0.9 % IV SOLN
100.0000 mg | Freq: Two times a day (BID) | INTRAVENOUS | Status: DC
  Administered 2023-10-24 – 2023-10-25 (×3): 100 mg via INTRAVENOUS
  Filled 2023-10-24 (×3): qty 100

## 2023-10-24 MED ORDER — DOCUSATE SODIUM 100 MG PO CAPS: 100.0000 mg | ORAL_CAPSULE | Freq: Two times a day (BID) | ORAL | Status: DC | PRN

## 2023-10-24 MED ORDER — PREDNISONE 20 MG PO TABS
60.0000 mg | ORAL_TABLET | Freq: Every day | ORAL | Status: AC
  Administered 2023-10-26 – 2023-10-27 (×2): 60 mg via ORAL
  Filled 2023-10-24 (×3): qty 3

## 2023-10-24 MED ORDER — PREDNISONE 10 MG PO TABS: 10.0000 mg | ORAL_TABLET | Freq: Every day | ORAL | Status: DC

## 2023-10-24 MED ORDER — POTASSIUM CHLORIDE 10 MEQ/100ML IV SOLN: 10.0000 meq | INTRAVENOUS | Status: DC

## 2023-10-24 MED ORDER — POTASSIUM CHLORIDE CRYS ER 20 MEQ PO TBCR
40.0000 meq | EXTENDED_RELEASE_TABLET | ORAL | Status: DC
Start: 2023-10-24 — End: 2023-10-24

## 2023-10-24 MED ORDER — INSULIN ASPART 100 UNIT/ML IJ SOLN
0.0000 [IU] | INTRAMUSCULAR | Status: DC
  Administered 2023-10-24: 3 [IU] via SUBCUTANEOUS
  Administered 2023-10-24: 8 [IU] via SUBCUTANEOUS
  Administered 2023-10-24 (×2): 5 [IU] via SUBCUTANEOUS
  Administered 2023-10-24: 2 [IU] via SUBCUTANEOUS
  Administered 2023-10-24 – 2023-10-25 (×2): 5 [IU] via SUBCUTANEOUS
  Administered 2023-10-25 (×2): 3 [IU] via SUBCUTANEOUS

## 2023-10-24 MED ORDER — POTASSIUM CHLORIDE 20 MEQ PO PACK
60.0000 meq | PACK | Freq: Once | ORAL | Status: AC
  Administered 2023-10-24: 60 meq via ORAL
  Filled 2023-10-24: qty 3

## 2023-10-24 MED ORDER — SODIUM CHLORIDE 0.9 % IV SOLN: INTRAVENOUS | Status: AC | PRN

## 2023-10-24 NOTE — Progress Notes (Signed)
 SLP Cancellation Note  Patient Details Name: Lauren Baker MRN: [id number] DOB: May 31, 1941   Cancelled treatment:        Attempted to see pt for swallowing assessment.  Pt continues to require BiPAP at this time and is not appropriate for PO trials.  SLP will follow for medical readiness   [name], MA, CCC-SLP Acute Rehabilitation Services Office: [phone] 10/24/2023, 8:22 AM

## 2023-10-24 NOTE — Progress Notes (Incomplete)
 Attending note: I have seen and examined the patient. History, labs and imaging reviewed.  82 Y/O of Afib on AC, SSS, HTN admitted with acute hypoxemic resp failure secondary to Flu A On Cardizem drip and oral metoprolol Remains on Bipap  Blood pressure 109/76, pulse (!) 116, temperature 98.6 F (37 C), temperature source Axillary, resp. rate 19, height 5\' 9"  (1.753 m), weight 82.6 kg, SpO2 93%. Gen:      No acute distress HEENT:  EOMI, sclera anicteric Neck:     No masses; no thyromegaly Lungs:    Clear to auscultation bilaterally; normal respiratory effort*** CV:         Regular rate and rhythm; no murmurs Abd:      + bowel sounds; soft, non-tender; no palpable masses, no distension Ext:    No edema; adequate peripheral perfusion Skin:      Warm and dry; no rash Neuro: alert and oriented x 3 Psych: normal mood and affect   Labs/Imaging personally reviewed, significant for ABG 7.5, Po2 90 WBC 5.9 Hb 10.6. Plts 105 CXR with persistent opacities  Assessment/plan: Acute hyoxic reso failure Remains on Bipap Try heated high flow  Continue doxy, cefepime for 7 days On tami flu Inhaled neds Solumedrol 60 mg bid  Afib, HfPEF Continue rate control Diltiazem drip, lopressors Lasix for diuresis   The patient is critically ill with multiple organ systems failure and requires high complexity decision making for assessment and support, frequent evaluation and titration of therapies, application of advanced monitoring technologies and extensive interpretation of multiple databases.  Critical care time - 35 mins. This represents my time independent of the NPs time taking care of the pt.  Chilton Greathouse MD [facility] 10/24/2023, 9:30 AM

## 2023-10-24 NOTE — Progress Notes (Signed)
 NAME:  Lauren Baker, MRN:  [id number], DOB:  27-Oct-1940, LOS: 4 ADMISSION DATE:  10/20/2023, CONSULTATION DATE:  10/22/23 REFERRING MD:  Nevin Bloodgood, MD CHIEF COMPLAINT:  Resp Failure/Flu A   History of Present Illness:  Lauren Baker is an 83 year old woman with atrial fibrillation and hypertension who is admitted to [facility] with acute hypoxemic respiratory failure due to influenza A pneumonia requiring 13-15L of oxygen.   She is being treated with oseltamavir and empiric antibiotics with cefepime. Vancomycin discontinued as MRSA screen was negative. She is receiving IV solumedrol 60mg  IV twice daily and budesonide, brovana and atrovent nebs. She was started on amiodarone for atrial fibrillation with RVR which is better rate controlled. She has been consistently on eliquis as outpatient and remains on it since being in the hospital.   Family reports she had second hand smoke from her husband for many years. Never smoked herself. Has otherwise been healthy and not been sick like this before. She has productive cough, some blood tinged secretions.   The patient had a recent hospital admission from 09/21/2023 to 09/24/2023 for acute respiratory failure. The patient was initially started on antibiotics, but these were discontinued. The patient was discharged with a prednisone taper for alveolitis. She was discharged on 4 L. She has been since weaned to 1.5 L. The patient had been doing fairly well up until 10/19/2023. She had been making progress with her PT and ambulating with a walker over 100 feet. She did develop some coughing and chest congestion. In the evening of 10/19/2023, the patient began having worsening shortness of breath. She was noted to be hypoxic with oxygen saturation in the 70s. She was transferred to the emergency department further evaluation and treatment.   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 Continued on BiPAP  Interim History / Subjective:   Feeling okay at the bedside after being on BiPAP overnight. Trialed off it but quickly desaturated <80%. Back on it. Daughters at beside. HR elevated despite AV nodal agents with variation. Otherwise, vitals this morning stable. Minimal urine output overnight after increasing dose of lasix. Trailed her off Bipap and desaturated on [place].    Objective   Blood pressure 114/64, pulse (!) 116, temperature 98 F (36.7 C), temperature source Axillary, resp. rate 18, height 5\' 9"  (1.753 m), weight 82.6 kg, SpO2 99%.    Vent Mode: BIPAP FiO2 (%):  [50 %-75 %] 60 % Set Rate:  [16 bmp-18 bmp] 16 bmp Pressure Support:  [5 cmH20-6 cmH20] 5 cmH20   Intake/Output Summary (Last 24 hours) at 10/24/2023 6962 Last data filed at 10/24/2023 0600 Gross per 24 hour  Intake 1491.22 ml  Output 825 ml  Net 666.22 ml   Filed Weights   10/20/23 0440 10/21/23 2300 10/24/23 0500  Weight: 79.8 kg 82.3 kg 82.6 kg   Examination: General: Elderly woman wearing BiPAP in NAD Lungs: Good bilateral air exchange without over wheezing Cardiovascular: tachycardia  Abdomen: soft, non distended. Milld suprapubic discomfoer Extremities: Warm. No LE edema.  Neuro: Alert and oriented.  GU: PurWik in place  Pertinent labs WBC: 5.9 -  Hgb 10.6 - MCV 95.8 PLT 105 < 91  K: 3.4 Cr 0.84  BNP 3/13 Procal -0.18  Expectorated sputum: ABG:  7.5 PCO 40 O2 90  Pertinent imaging Bladder scan: >983mL in bladder 3/15: interstitial lung opacities with RLL  CXR 3/14 Increased interstitial lung opacities CT 3/11 - Pulmonary edema/ R bronchus intermedius with mucoid materoa;  Resolved Hospital Problem list     Assessment & Plan:    Lauren Baker is a 83yo F with hx of atrial fibrillation, HTN, and acute respiratory failure due to influenza admitted to ICU for same currently on BiPAP; suspect patient's acute illness exacerbated fluid status and is exacerbating Afib with RVR this hospitalization  Acute hypoxemic respiratory failure Influenza pneumonia Continous BiPAP overnight.  - Doxycycline and Cefepime  (3/ 11-- - Osetalmivir 3/11 -3/15 - yupelri, pulmicort, brovana  - Methylprednisolone 60 mg ( 3/11 - - Duonebs - Guafenesin  Permanent Atrial fibrillation with RVR - Metop 50 mg q8 PO - may need to transition to IV - Diltiazem 15 mg/hr - Goal HR <120 - Chronic AC with Eliquis - holding since NPO  Acute on Chronic HFpEF HTN CXR with pulmonary edema and bilateral chest effusions. BNP on admission 859 2/25 TTE with LVE 55-60%, indet diastolic function, normal RV function, severe BAE, mild MR  - s/p lasix 60 mg BID and albumin - Weights and I/Os daily - Cardiology on board - Bladder scan today with >986mL - No increase in IV diureses today  - Adding Mg today - K repletion through IV x6  Bladder stretch injury -Foley placed  Anemia Thrombocytopenia improving  Tachybrady syndrome - Has pacemanker  Depression -Bupropion TID -Duloxetine 30 mg daily -Clonazepam 0.5 mg BID -Quetapine  Hypothyroidism - Levorhyroxine 25 mcg daily  Best Practice (right click and "Reselect all SmartList Selections" daily)   Diet/type: NPO DVT prophylaxis: LMWH GI prophylaxis: PPI Lines: N/A Foley:  N/A Code Status:  full code Last date of multidisciplinary goals of care discussion [--]  Labs   CBC: Recent Labs  Lab 10/20/23 0517 10/21/23 0506 10/22/23 0449 10/23/23 0534 10/24/23 0351  WBC 2.1* 2.6* 4.8 5.9  --   NEUTROABS 1.2*  --   --   --   --   HGB 10.7* 9.6* 10.6* 10.6* 9.5*  HCT 32.7* 29.6* 32.0* 32.3* 28.0*  MCV 97.9 97.4 96.1 95.8  --   PLT 89* 88* 91* 105*  --     Basic  Metabolic Panel: Recent Labs  Lab 10/20/23 0517 10/22/23 0449 10/23/23 0534 10/24/23 0351  NA 136 136 135 138  K 4.2 3.3* 3.4* 3.4*  CL 100 102 96*  --   CO2 27 27 28   --   GLUCOSE 133* 205* 230*  --   BUN 15 21 26*  --   CREATININE 1.02* 0.83 0.84  --   CALCIUM 9.1 8.8* 8.9  --    GFR: Estimated Creatinine Clearance: 60.4 mL/min (by C-G formula based on SCr of 0.84 mg/dL). Recent Labs  Lab 10/20/23 0517 10/20/23 0812 10/20/23 1124 10/21/23 0506 10/22/23 0449 10/22/23 1404 10/23/23 0534  PROCALCITON 0.25  --   --   --   --  0.18  --   WBC 2.1*  --   --  2.6* 4.8  --  5.9  LATICACIDVEN  --  1.1 1.6  --   --  4.0*  --     Liver Function Tests: Recent Labs  Lab 10/20/23 0517  AST 40  ALT 36  ALKPHOS 98  BILITOT 2.1*  PROT 5.8*  ALBUMIN 2.8*   No results for input(s): "LIPASE", "AMYLASE" in  the last 168 hours. No results for input(s): "AMMONIA" in the last 168 hours.  ABG    Component Value Date/Time   PHART 7.500 (H) 10/24/2023 0351   PCO2ART 40.8 10/24/2023 0351   PO2ART 90 10/24/2023 0351   HCO3 31.9 (H) 10/24/2023 0351   TCO2 33 (H) 10/24/2023 0351   ACIDBASEDEF 4.3 (H) 10/19/2018 0325   O2SAT 98 10/24/2023 0351     Coagulation Profile: No results for input(s): "INR", "PROTIME" in the last 168 hours.  Cardiac Enzymes: Recent Labs  Lab 10/22/23 1925  CKTOTAL 20*    HbA1C: Hgb A1c MFr Bld  Date/Time Value Ref Range Status  10/24/2023 02:55 AM 6.4 (H) 4.8 - 5.6 % Final    Comment:    (NOTE) Pre diabetes:          5.7%-6.4%  Diabetes:              >6.4%  Glycemic control for   <7.0% adults with diabetes     CBG: Recent Labs  Lab 10/21/23 2050 10/23/23 2358 10/24/23 0337  GLUCAP 203* 261* 224*    Review of Systems:     Past Medical History:  She,  has a past medical history of Chronic atrial fibrillation (HCC) and Hypertension.   Surgical History:   Past Surgical History:  Procedure Laterality Date   detatched retina      PACEMAKER IMPLANT N/A 10/25/2018   Procedure: PACEMAKER IMPLANT;  Surgeon: Hillis Range, MD;  Location: [facility] INVASIVE CV LAB;  Service: Cardiovascular;  Laterality: N/A;     Social History:   reports that she has never smoked. She has never used smokeless tobacco. She reports that she does not drink alcohol and does not use drugs.   Family History:  Her family history includes CVA in her mother; Cancer in her sister; Heart attack in her brother and sister; Hypertension in her sister.   Allergies No Known Allergies   Home Medications  Prior to Admission medications   Medication Sig Start Date End Date Taking? Authorizing Provider  acetaminophen (TYLENOL) 325 MG tablet Take 2 tablets (650 mg total) by mouth every 6 (six) hours as needed for mild pain (pain score 1-3) (or Fever >/= 101). 09/24/23  Yes Vassie Loll, MD  apixaban (ELIQUIS) 5 MG TABS tablet TAKE 1 TABLET BY MOUTH TWICE DAILY. 07/03/21  Yes Antoine Poche, MD  buPROPion (WELLBUTRIN) 100 MG tablet Take 100 mg by mouth 3 (three) times daily. 02/26/18  Yes [provider]  cholecalciferol (VITAMIN D) 1000 units tablet Take 1,000 Units by mouth daily.   Yes [provider]  clonazePAM (KLONOPIN) 0.5 MG tablet Take 0.5 mg by mouth 2 (two) times daily. 01/19/18  Yes [provider]  dextromethorphan-guaiFENesin (MUCINEX DM) 30-600 MG 12hr tablet Take 1 tablet by mouth 2 (two) times daily as needed for cough. 09/24/23  Yes Vassie Loll, MD  diltiazem (CARDIZEM CD) 300 MG 24 hr capsule Take 1 capsule (300 mg total) by mouth daily. 09/29/22  Yes BranchDorothe Pea, MD  DULoxetine (CYMBALTA) 30 MG capsule Take 30 mg by mouth daily.  12/13/17  Yes [provider]  levothyroxine (SYNTHROID) 25 MCG tablet Take 25 mcg by mouth daily. 11/27/21  Yes [provider]  losartan (COZAAR) 50 MG tablet Take 1 tablet (50 mg total) by mouth daily. 09/29/22  Yes BranchDorothe Pea, MD  metoprolol (TOPROL-XL)  200 MG 24 hr tablet Take 1 tablet (200 mg total) by mouth daily. 09/29/22  Yes Antoine Poche, MD  omeprazole (PRILOSEC) 40 MG capsule Take 40 mg by mouth daily.   Yes [provider]  QUEtiapine (SEROQUEL) 50 MG tablet Take 50 mg by mouth at bedtime.  02/26/18  Yes [provider]     Critical care time:     Morene Crocker, MD [facility] Internal Medicine Program - PGY-2 10/24/2023, 7:02 AM

## 2023-10-24 NOTE — Progress Notes (Signed)
 eLink Physician-Brief Progress Note Patient Name: Lauren Baker DOB: 08/16/1940 MRN: [id number]   Date of Service  10/24/2023  HPI/Events of Note  83 year old with a history of A-fib, essential hypertension and respiratory failure secondary to influenza infection being followed by pulmonary medicine now transferred to the ICU.  She is placed on continuous BiPAP therapy.  On examination, the patient is tachypneic, tachycardic, but otherwise normal vitals.  Saturating 100% on 60% FiO2 on the BiPAP.  Results are consistent with metabolic alkalosis, mild hypoxemia, minimal electrolyte disturbances, anemia and thrombocytopenia.  Radiograph with kyphosis and diffuse airspace disease.  eICU Interventions  Maintain diltiazem for heart rate.  On maximal dose with limited rate control.  Goal heart rate less than 120 updated order.  If she has refractory A-fib, could consider rhythm control strategy with amiodarone given chronic anticoagulation.  Patient is n.p.o. in the setting of continuous BiPAP.  Switch doxycycline from tablet to IV.  All remaining enteral medications to be held for the time being, would likely benefit from duotube placement but at risk for respiratory decompensation at this time.  Continue broad-spectrum antibiotics, oseltamavir, scheduled SVNs  Currently on high-dose steroids  Net +1.6 L for her admission, consider diuresis  DVT prophylaxis with apixaban GI prophylaxis with home PPI.     Intervention Category Evaluation Type: New Patient Evaluation  Lauren Baker 10/24/2023, 1:09 AM

## 2023-10-24 NOTE — Plan of Care (Signed)
   Problem: Clinical Measurements: Goal: Respiratory complications will improve Outcome: Progressing

## 2023-10-24 NOTE — Progress Notes (Signed)
 PT Cancellation Note  Patient Details Name: Lauren Baker MRN: [id number] DOB: Mar 04, 1941   Cancelled Treatment:    Reason Eval/Treat Not Completed: Patient not medically ready. Pt back on bipap.   Angelina Ok [facility] 10/24/2023, 9:08 AM Skip Mayer PT Acute [employer] [phone]

## 2023-10-24 NOTE — Progress Notes (Signed)
 Progress Note  Patient Name: Lauren Baker Date of Encounter: 10/24/2023  Primary Cardiologist: Dina Rich, MD   Subjective   Patient seen examined at her bedside family member at bedside (daughter and daughter-in-law).  She was awake when I arrived.  Inpatient Medications    Scheduled Meds:  acidophilus  2 capsule Oral TID   apixaban  5 mg Oral BID   arformoterol  15 mcg Nebulization BID   budesonide (PULMICORT) nebulizer solution  0.5 mg Nebulization BID   buPROPion  100 mg Oral TID   Chlorhexidine Gluconate Cloth  6 each Topical Q0600   cholecalciferol  1,000 Units Oral Daily   clonazePAM  0.5 mg Oral BID   DULoxetine  30 mg Oral Daily   feeding supplement  237 mL Oral BID BM   furosemide  60 mg Intravenous BID   guaiFENesin-dextromethorphan  10 mL Oral Q8H   insulin aspart  0-15 Units Subcutaneous Q4H   levothyroxine  25 mcg Oral Q0600   methylPREDNISolone (SOLU-MEDROL) injection  60 mg Intravenous Q12H   metoprolol tartrate  50 mg Oral Q8H   oseltamivir  75 mg Oral BID   pantoprazole  40 mg Oral Daily   potassium chloride  40 mEq Oral Q4H   QUEtiapine  50 mg Oral QHS   revefenacin  175 mcg Nebulization Daily   senna-docusate  1 tablet Oral BID   Continuous Infusions:  sodium chloride 5 mL/hr at 10/24/23 0600   ceFEPime (MAXIPIME) IV 2 g (10/24/23 0752)   diltiazem (CARDIZEM) infusion 15 mg/hr (10/24/23 0600)   doxycycline (VIBRAMYCIN) IV     PRN Meds: sodium chloride, acetaminophen **OR** acetaminophen, docusate sodium, hydrOXYzine, levalbuterol, ondansetron **OR** ondansetron (ZOFRAN) IV, polyethylene glycol   Vital Signs    Vitals:   10/24/23 0530 10/24/23 0600 10/24/23 0630 10/24/23 0719  BP: 116/70 119/79 114/64   Pulse: (!) 120 (!) 125 (!) 116   Resp: 16 (!) 38 18   Temp:    98.6 F (37 C)  TempSrc:    Axillary  SpO2: 100% 97% 99%   Weight:      Height:        Intake/Output Summary (Last 24 hours) at 10/24/2023 0848 Last data filed  at 10/24/2023 0600 Gross per 24 hour  Intake 1251.22 ml  Output 825 ml  Net 426.22 ml   Filed Weights   10/20/23 0440 10/21/23 2300 10/24/23 0500  Weight: 79.8 kg 82.3 kg 82.6 kg    Telemetry    Atrial fibrillation rapid ventricular rate - Personally Reviewed  ECG    None today  - Personally Reviewed  Physical Exam   General: Comfortable, on bipap Head: Atraumatic, normal size  Eyes: PEERLA, EOMI  Neck: Supple, normal JVD Cardiac: Normal S1, S2; RRR; no murmurs, rubs, or gallops Lungs: Clear to auscultation bilaterally Abd: Soft, nontender, no hepatomegaly  Ext: warm, no edema Musculoskeletal: No deformities, BUE and BLE strength normal and equal Skin: Warm and dry, no rashes   Neuro: Alert and oriented to person, place, time, and situation, CNII-XII grossly intact, no focal deficits  Psych: Normal mood and affect   Labs    Chemistry Recent Labs  Lab 10/20/23 0517 10/22/23 0449 10/23/23 0534 10/24/23 0351  NA 136 136 135 138  K 4.2 3.3* 3.4* 3.4*  CL 100 102 96*  --   CO2 27 27 28   --   GLUCOSE 133* 205* 230*  --   BUN 15 21 26*  --  CREATININE 1.02* 0.83 0.84  --   CALCIUM 9.1 8.8* 8.9  --   PROT 5.8*  --   --   --   ALBUMIN 2.8*  --   --   --   AST 40  --   --   --   ALT 36  --   --   --   ALKPHOS 98  --   --   --   BILITOT 2.1*  --   --   --   GFRNONAA 55* >60 >60  --   ANIONGAP 9 7 11   --      Hematology Recent Labs  Lab 10/21/23 0506 10/22/23 0449 10/23/23 0534 10/24/23 0351  WBC 2.6* 4.8 5.9  --   RBC 3.04* 3.33* 3.37*  --   HGB 9.6* 10.6* 10.6* 9.5*  HCT 29.6* 32.0* 32.3* 28.0*  MCV 97.4 96.1 95.8  --   MCH 31.6 31.8 31.5  --   MCHC 32.4 33.1 32.8  --   RDW 15.8* 15.9* 15.9*  --   PLT 88* 91* 105*  --     Cardiac EnzymesNo results for input(s): "TROPONINI" in the last 168 hours. No results for input(s): "TROPIPOC" in the last 168 hours.   BNP Recent Labs  Lab 10/20/23 0517 10/21/23 1556  BNP 859.0* 713.0*     DDimer No  results for input(s): "DDIMER" in the last 168 hours.   Radiology    DG Chest Port 1 View Result Date: 10/24/2023 CLINICAL DATA:  Respiratory failure. EXAM: PORTABLE CHEST 1 VIEW COMPARISON:  10/23/2023 FINDINGS: Left chest wall pacer with lead in the right ventricle, unchanged. Stable cardiomediastinal contours. Small effusions appear similar to previous exam. Unchanged appearance of bilateral interstitial opacities, scarring and architectural distortion. IMPRESSION: 1. No significant change in the appearance of the chest. 2. Small effusions and bilateral interstitial opacities. Electronically Signed   By: Signa Kell M.D.   On: 10/24/2023 08:42   DG CHEST PORT 1 VIEW Result Date: 10/23/2023 CLINICAL DATA:  Shortness of breath EXAM: PORTABLE CHEST 1 VIEW COMPARISON:  X-ray and CT chest without contrast 10/20/2023 FINDINGS: Kyphotic x-ray. This limited evaluation of the apices. Enlarged cardiopericardial silhouette. Tortuous ectatic aorta. Left upper chest pacemaker. No pneumothorax. Small effusions. Increasing interstitial changes seen lungs with some patchy parenchymal changes as well. Overlapping cardiac leads. IMPRESSION: Kyphotic x-ray. Enlarged cardiopericardial silhouette with pacemaker. Small effusions. Increasing interstitial and parenchymal lung opacities. Recommend follow-up Electronically Signed   By: Karen Kays M.D.   On: 10/23/2023 11:22    Cardiac Studies  Echo reviewed   Patient Profile     83 y.o. 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  She is currently on Cardizem drip and her rate is now well-controlled.  Unfortunately she is on BiPAP so was unable to take her Lopressor.    If the patient is unable to be titrated off BiPAP please could use IV metoprolol in the meantime. As noted above her infection is driving her heart rate therefore we will go with  a linear approach here in expected her heart rate would be in the 120s and below understanding that if she was in sinus rhythm and in the setting of infection she will be sinus tachycardia.  As long as her blood pressure can tolerate it we can increase the rate on her Cardizem drip.  Clinically she does not appear to be fluid overloaded, review of her chest x-ray this morning have diffuse interstitial opacities not quite consistent with pulmonary edema.  But for now we will keep her on the Lasix and monitor her urine output-she has had a positive balance please do a bladder scan as well.  Most likely her hypoxia is driven by her acute pulmonary or likely chronic pulmonary condition giving the characteristics of her x-ray.  One of the challenges with being on her diuretics as well she is persistently hypokalemic/presence of electrolyte abnormalities so we will strive to keep her potassium above 4 and her magnesium above 2.  Will continue to follow with you    For questions or updates, please contact [facility] Please consult [email or web address] for contact info under Cardiology/STEMI.      SignedThomasene Ripple, DO  10/24/2023, 8:48 AM

## 2023-10-24 NOTE — Progress Notes (Signed)
   10/24/23 0843  Vent Select  Invasive or Noninvasive Noninvasive  Adult Vent Y  Adult Ventilator Settings  Vent Type Servo i  Vent Mode BIPAP;PCV  Set Rate 18 bmp  FiO2 (%) 40 %  Pressure Control 5 cmH20  PEEP 5 cmH20  Adult Ventilator Measurements  Peak Airway Pressure 13 L/min  Mean Airway Pressure 5 cmH20  Resp Rate Spontaneous 11 br/min  Resp Rate Total 27 br/min  Exhaled Vt 653 mL  Measured Ve 13.7 L  I:E Ratio Measured 1:1.6  Auto PEEP 0 cmH20  Total PEEP 5 cmH20  SpO2 99 %  Adult Ventilator Alarms  Alarms On Y  Ve High Alarm 21 L/min  Ve Low Alarm 4 L/min  Resp Rate High Alarm 38 br/min  Resp Rate Low Alarm 8  PEEP Low Alarm 3 cmH2O  Press High Alarm 25 cmH2O  VAP Prevention  HOB> 30 Degrees Y  Equipment wiped down Yes  Breath Sounds  Bilateral Breath Sounds Diminished  Vent Respiratory Assessment  Respiratory Pattern Regular;Unlabored     RT trialed pt off BIPAP to 4l [place] with family at bedside. Pt's sats dropped to 82 and was not recovering so RT placed pt back on BIPAP. Pt's sats immediately increased and vitals stable. RN & MD aware. RT will monitor.

## 2023-10-24 NOTE — Evaluation (Signed)
 Clinical/Bedside Swallow Evaluation Patient Details  Name: Lauren Baker MRN: [id number] Date of Birth: [birth year]  Today's Date: 10/24/2023 Time: SLP Start Time (ACUTE ONLY): 1207 SLP Stop Time (ACUTE ONLY): 1225 SLP Time Calculation (min) (ACUTE ONLY): 18 min  Past Medical History:  Past Medical History:  Diagnosis Date   Chronic atrial fibrillation (HCC)    Hypertension    Past Surgical History:  Past Surgical History:  Procedure Laterality Date   detatched retina     PACEMAKER IMPLANT N/A 10/25/2018   Procedure: PACEMAKER IMPLANT;  Surgeon: Hillis Range, MD;  Location: [facility] INVASIVE CV LAB;  Service: Cardiovascular;  Laterality: N/A;   HPI:  Lauren Baker is an 83 year old woman who was admitted to [facility] with acute hypoxemic respiratory failure due to influenza A pneumonia requiring 13-15L of oxygen. Transfered to [facility] ICU requiring BiPAP, now transitioned to 45L HHFNC. CXR 3/15: "Stable cardiomediastinal contours. Small effusions appear similar to  previous exam. Unchanged appearance of bilateral interstitial  opacities, scarring and architectural distortion." Pt with atrial fibrillation and hypertension    Assessment / Plan / Recommendation  Clinical Impression  Pt presents with functional swallowing as assessed clincally. She tolerated all consistencies trialed with no clinical s/s of aspiration.  Pt on 45L  HHFNC and with baseline tachypnea and tachycardia, which did not worsen with PO trials.  SPO2 remained in high 90s-100.  Pt is edentulous and does not have dentures.  She consumes a regular diet at baseline, but had trouble biting into regular texture solid cracker today.  Pt exhbitied adequate oral clearance of soft solid. Pt took oral medications with sips of thin liquid without difficulty. Reviewed respiratory precautions with POs given ongoing tachypnea. SLP will follow for tolerance and possible advancement.   Recommend mechanical soft diet with thin liquids.   SLP Visit  Diagnosis: Dysphagia, oral phase (R13.11)    Aspiration Risk  Mild aspiration risk    Diet Recommendation Dysphagia 3 (Mech soft);Thin liquid    Liquid Administration via: Cup;Straw Medication Administration: Whole meds with liquid Supervision: Staff to assist with self feeding (as needed) Compensations: Slow rate;Small sips/bites (takes breaks if short of breath) Postural Changes: Seated upright at 90 degrees    Other  Recommendations Oral Care Recommendations: Oral care BID    Recommendations for follow up therapy are one component of a multi-disciplinary discharge planning process, led by the attending physician.  Recommendations may be updated based on patient status, additional functional criteria and insurance authorization.  Follow up Recommendations  (Hopeful for resolution prior to d/c; at present ST at next level of care)      Assistance Recommended at Discharge  N/A  Functional Status Assessment Patient has had a recent decline in their functional status and demonstrates the ability to make significant improvements in function in a reasonable and predictable amount of time.  Frequency and Duration min 2x/week  2 weeks       Prognosis Prognosis for improved oropharyngeal function: Good      Swallow Study   General HPI: Lauren Baker is an 83 year old woman who was admitted to [facility] with acute hypoxemic respiratory failure due to influenza A pneumonia requiring 13-15L of oxygen. Transfered to [facility] ICU requiring BiPAP, now transitioned to 45L HHFNC. CXR 3/15: "Stable cardiomediastinal contours. Small effusions appear similar to  previous exam. Unchanged appearance of bilateral interstitial  opacities, scarring and architectural distortion." Pt with atrial fibrillation and hypertension Type of Study: Bedside Swallow Evaluation Previous Swallow Assessment: CSE  10/20/2018 WFL Diet Prior to this Study: NPO Temperature Spikes Noted: No Respiratory Status: Nasal cannula (HHFNC  45L) History of Recent Intubation: No Behavior/Cognition: Cooperative;Pleasant mood;Alert Oral Cavity Assessment: Within Functional Limits Oral Care Completed by SLP: No Oral Cavity - Dentition: Edentulous Self-Feeding Abilities: Needs assist Patient Positioning: Upright in bed Baseline Vocal Quality: Normal Volitional Cough: Weak Volitional Swallow: Able to elicit    Oral/Motor/Sensory Function Overall Oral Motor/Sensory Function: Mild impairment Facial ROM: Within Functional Limits Facial Symmetry: Within Functional Limits Facial Strength: Within Functional Limits Lingual ROM: Within Functional Limits Lingual Symmetry: Within Functional Limits Lingual Strength: Reduced Velum: Within Functional Limits Mandible: Within Functional Limits   Ice Chips Ice chips: Not tested   Thin Liquid Thin Liquid: Within functional limits Presentation: Straw    Nectar Thick Nectar Thick Liquid: Not tested   Honey Thick Honey Thick Liquid: Not tested   Puree Puree: Within functional limits   Solid     Solid: Impaired Oral Phase Functional Implications: Impaired mastication      Lauren Pleasure, MA, CCC-SLP Acute Rehabilitation Services Office: [phone] 10/24/2023,12:46 PM

## 2023-10-25 DIAGNOSIS — D649 Anemia, unspecified: Secondary | ICD-10-CM

## 2023-10-25 DIAGNOSIS — J1100 Influenza due to unidentified influenza virus with unspecified type of pneumonia: Secondary | ICD-10-CM

## 2023-10-25 DIAGNOSIS — D61818 Other pancytopenia: Secondary | ICD-10-CM | POA: Diagnosis not present

## 2023-10-25 DIAGNOSIS — J09X1 Influenza due to identified novel influenza A virus with pneumonia: Secondary | ICD-10-CM | POA: Diagnosis not present

## 2023-10-25 DIAGNOSIS — N1832 Chronic kidney disease, stage 3b: Secondary | ICD-10-CM | POA: Diagnosis not present

## 2023-10-25 DIAGNOSIS — I5030 Unspecified diastolic (congestive) heart failure: Secondary | ICD-10-CM | POA: Diagnosis not present

## 2023-10-25 DIAGNOSIS — J9601 Acute respiratory failure with hypoxia: Secondary | ICD-10-CM | POA: Diagnosis not present

## 2023-10-25 DIAGNOSIS — I4891 Unspecified atrial fibrillation: Secondary | ICD-10-CM | POA: Diagnosis not present

## 2023-10-25 LAB — BASIC METABOLIC PANEL
Anion gap: 11 (ref 5–15)
BUN: 35 mg/dL — ABNORMAL HIGH (ref 8–23)
CO2: 30 mmol/L (ref 22–32)
Calcium: 9.1 mg/dL (ref 8.9–10.3)
Chloride: 96 mmol/L — ABNORMAL LOW (ref 98–111)
Creatinine, Ser: 1.25 mg/dL — ABNORMAL HIGH (ref 0.44–1.00)
GFR, Estimated: 43 mL/min — ABNORMAL LOW (ref >60)
Glucose, Bld: 150 mg/dL — ABNORMAL HIGH (ref 70–99)
Potassium: 3.4 mmol/L — ABNORMAL LOW (ref 3.5–5.1)
Sodium: 137 mmol/L (ref 135–145)

## 2023-10-25 LAB — CBC
HCT: 26.8 % — ABNORMAL LOW (ref 36.0–46.0)
Hemoglobin: 9.1 g/dL — ABNORMAL LOW (ref 12.0–15.0)
MCH: 31.8 pg (ref 26.0–34.0)
MCHC: 34 g/dL (ref 30.0–36.0)
MCV: 93.7 fL (ref 80.0–100.0)
Platelets: 86 10*3/uL — ABNORMAL LOW (ref 150–400)
RBC: 2.86 MIL/uL — ABNORMAL LOW (ref 3.87–5.11)
RDW: 15.5 % (ref 11.5–15.5)
WBC: 4.2 10*3/uL (ref 4.0–10.5)
nRBC: 0 % (ref 0.0–0.2)

## 2023-10-25 LAB — GLUCOSE, CAPILLARY
Glucose-Capillary: 152 mg/dL — ABNORMAL HIGH (ref 70–99)
Glucose-Capillary: 168 mg/dL — ABNORMAL HIGH (ref 70–99)
Glucose-Capillary: 198 mg/dL — ABNORMAL HIGH (ref 70–99)
Glucose-Capillary: 212 mg/dL — ABNORMAL HIGH (ref 70–99)
Glucose-Capillary: 225 mg/dL — ABNORMAL HIGH (ref 70–99)
Glucose-Capillary: 231 mg/dL — ABNORMAL HIGH (ref 70–99)
Glucose-Capillary: 250 mg/dL — ABNORMAL HIGH (ref 70–99)

## 2023-10-25 LAB — MAGNESIUM: Magnesium: 2 mg/dL (ref 1.7–2.4)

## 2023-10-25 LAB — PHOSPHORUS: Phosphorus: 2.4 mg/dL — ABNORMAL LOW (ref 2.5–4.6)

## 2023-10-25 MED ORDER — DM-GUAIFENESIN ER 30-600 MG PO TB12
1.0000 | ORAL_TABLET | Freq: Two times a day (BID) | ORAL | Status: DC
  Administered 2023-10-25 – 2023-10-27 (×5): 1 via ORAL
  Filled 2023-10-25 (×6): qty 1

## 2023-10-25 MED ORDER — METOPROLOL TARTRATE 25 MG PO TABS
25.0000 mg | ORAL_TABLET | Freq: Three times a day (TID) | ORAL | Status: DC
  Administered 2023-10-25 – 2023-10-26 (×3): 25 mg via ORAL
  Filled 2023-10-25 (×3): qty 1

## 2023-10-25 MED ORDER — INSULIN ASPART 100 UNIT/ML IJ SOLN
0.0000 [IU] | INTRAMUSCULAR | Status: DC
  Administered 2023-10-25 (×2): 7 [IU] via SUBCUTANEOUS
  Administered 2023-10-25: 4 [IU] via SUBCUTANEOUS
  Administered 2023-10-25 – 2023-10-26 (×2): 7 [IU] via SUBCUTANEOUS
  Administered 2023-10-26: 4 [IU] via SUBCUTANEOUS
  Administered 2023-10-26: 3 [IU] via SUBCUTANEOUS
  Administered 2023-10-26: 4 [IU] via SUBCUTANEOUS
  Administered 2023-10-26: 7 [IU] via SUBCUTANEOUS
  Administered 2023-10-27: 4 [IU] via SUBCUTANEOUS
  Administered 2023-10-27: 11 [IU] via SUBCUTANEOUS

## 2023-10-25 MED ORDER — POTASSIUM PHOSPHATES 15 MMOLE/5ML IV SOLN
15.0000 mmol | Freq: Once | INTRAVENOUS | Status: AC
  Administered 2023-10-25: 15 mmol via INTRAVENOUS
  Filled 2023-10-25: qty 5

## 2023-10-25 MED ORDER — POTASSIUM CHLORIDE 20 MEQ PO PACK
40.0000 meq | PACK | Freq: Two times a day (BID) | ORAL | Status: DC
  Administered 2023-10-25: 40 meq via ORAL
  Filled 2023-10-25: qty 2

## 2023-10-25 MED ORDER — ORAL CARE MOUTH RINSE: 15.0000 mL | OROMUCOSAL | Status: DC | PRN

## 2023-10-25 MED ORDER — DOXYCYCLINE HYCLATE 100 MG PO TABS
100.0000 mg | ORAL_TABLET | Freq: Two times a day (BID) | ORAL | Status: AC
  Administered 2023-10-25 – 2023-10-26 (×3): 100 mg via ORAL
  Filled 2023-10-25 (×3): qty 1

## 2023-10-25 MED ORDER — SODIUM CHLORIDE 0.9 % IV SOLN
2.0000 g | Freq: Two times a day (BID) | INTRAVENOUS | Status: AC
  Administered 2023-10-25 – 2023-10-26 (×3): 2 g via INTRAVENOUS
  Filled 2023-10-25 (×2): qty 12.5

## 2023-10-25 NOTE — Progress Notes (Signed)
 eLink Physician-Brief Progress Note Patient Name: Lauren Baker DOB: [birth year] MRN: [id number]   Date of Service  10/25/2023  HPI/Events of Note  AM K+ 3.4, on scheduled K+, too soon for [facility] to give the 10AM dose  eICU Interventions  Moved up scheduled dose to now     Intervention Category Intermediate Interventions: Electrolyte abnormality - evaluation and management  Darl Pikes 10/25/2023, 5:59 AM

## 2023-10-25 NOTE — Progress Notes (Addendum)
 NAME:  Lauren Baker, MRN:  [id number], DOB:  1941/01/30, LOS: 5 ADMISSION DATE:  10/20/2023, CONSULTATION DATE:  10/22/23 REFERRING MD:  Nevin Bloodgood, MD CHIEF COMPLAINT:  Resp Failure/Flu A   History of Present Illness:  Lauren Baker is an 83 year old woman with atrial fibrillation and hypertension who is admitted to [facility] with acute hypoxemic respiratory failure due to influenza A pneumonia requiring 13-15L of oxygen.   She is being treated with oseltamavir and empiric antibiotics with cefepime. Vancomycin discontinued as MRSA screen was negative. She is receiving IV solumedrol 60mg  IV twice daily and budesonide, brovana and atrovent nebs. She was started on amiodarone for atrial fibrillation with RVR which is better rate controlled. She has been consistently on eliquis as outpatient and remains on it since being in the hospital.   Family reports she had second hand smoke from her husband for many years. Never smoked herself. Has otherwise been healthy and not been sick like this before. She has productive cough, some blood tinged secretions.   The patient had a recent hospital admission from 09/21/2023 to 09/24/2023 for acute respiratory failure. The patient was initially started on antibiotics, but these were discontinued. The patient was discharged with a prednisone taper for alveolitis. She was discharged on 4 L. She has been since weaned to 1.5 L. The patient had been doing fairly well up until 10/19/2023. She had been making progress with her PT and ambulating with a walker over 100 feet. She did develop some coughing and chest congestion. In the evening of 10/19/2023, the patient began having worsening shortness of breath. She was noted to be hypoxic with oxygen saturation in the 70s. She was transferred to the emergency department further evaluation and treatment.   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 Continued on BiPAP 3/15 Transition to high flow nasal cannula, Foley catheter placed for urinary retention  Interim History / Subjective:   Transition to high flow nasal cannula, Foley catheter placed for urinary retention Started on dysphagia 3 diet  Objective   Blood pressure 104/60, pulse (!) 114, temperature 98.4 F (36.9 C), temperature source Oral, resp. rate 19, height 5\' 9"  (1.753 m), weight 82.9 kg, SpO2 98%.    Vent Mode: BIPAP;PCV FiO2 (%):  [40 %-60 %] 60 % Set Rate:  [18 bmp] 18 bmp PEEP:  [5 cmH20] 5 cmH20   Intake/Output Summary (Last 24 hours) at 10/25/2023 0751 Last data filed at 10/25/2023 0700 Gross per 24 hour  Intake 1205.15 ml  Output 3650 ml  Net -2444.85 ml   Filed Weights   10/21/23 2300 10/24/23 0500 10/25/23 0500  Weight: 82.3 kg 82.6 kg 82.9 kg   Examination: Gen:      No acute distress HEENT:  EOMI, sclera anicteric Neck:     No masses; no thyromegaly Lungs:    Bilateral rhonchi CV:         Irregular, tachycardic Abd:      + bowel sounds; soft, non-tender; no palpable masses, no distension Ext:    No edema; adequate peripheral perfusion Skin:      Warm and dry; no rash Neuro: Awake, nods to questions  Pertinent labs Potassium 3.4, BUN/creatinine 35/1.25 Hemoglobin 9.1, platelets 86  Pertinent imaging Bladder scan: >938mL  in bladder 3/15: interstitial lung opacities with RLL  Chest x-ray 3/15-stable small effusion, interstitial opacities CT 3/11 - Pulmonary edema/ R bronchus intermedius with mucoid materoa;  Resolved Hospital Problem list     Assessment & Plan:   Lauren Baker is a 83yo F with hx of atrial fibrillation, HTN, and acute respiratory failure due to influenza admitted to ICU for  same currently on BiPAP; suspect patient's acute illness exacerbated fluid status and is exacerbating Afib with RVR this hospitalization  Acute hypoxemic respiratory failure Influenza pneumonia On heated high flow, wean down as tolerated Continue doxycycline, cefepime Finished course of Tamiflu Continue Brovana, Pulmicort, Yupelri Xopenex as needed Prednisone taper  Permanent Atrial fibrillation with RVR Cardizem drip Metoprolol p.o. Telemetry monitoring Eliquis for anticoagulation  Acute on Chronic HFpEF HTN CXR with pulmonary edema and bilateral chest effusions. BNP on admission 859 2/25 TTE with LVE 55-60%, indet diastolic function, normal RV function, severe BAE, mild MR  Hold Lasix as creatinine is higher She is self diuresing after Foley was placed for urinary retention Monitor I's/O Monitor and replete electrolytes  Bladder stretch injury, urinary retention Continue Foley  Anemia Thrombocytopenia Monitor. May need to stop eliquis if it drops further  Tachybrady syndrome - Has pacemanker  Depression -Bupropion TID -Duloxetine 30 mg daily -Clonazepam 0.5 mg BID -Quetapine  Hypothyroidism - Levorhyroxine 25 mcg daily  Hyperglycemia SSI resistant scale  Best Practice (right click and "Reselect all SmartList Selections" daily)   Diet/type: dysphagia diet (see orders) DVT prophylaxis: DOAC GI prophylaxis: PPI Lines: N/A Foley:  Yes, and it is still needed Code Status:  full code Last date of multidisciplinary goals of care discussion [--]  Critical care time:    The patient is critically ill with multiple organ system failure and requires high complexity decision making for assessment and support, frequent evaluation and titration of therapies, advanced monitoring, review of radiographic studies and interpretation of complex data.   Critical Care Time devoted to patient care services, exclusive of separately billable procedures, described in this note  is 35 minutes.   Chilton Greathouse MD [facility] See Amion for pager  If no response to pager , please call [phone] until 7pm After 7:00 pm call Elink  [phone] 10/25/2023, 7:51 AM

## 2023-10-25 NOTE — Progress Notes (Signed)
 Progress Note  Patient Name: Lauren Baker Date of Encounter: 10/25/2023  Primary Cardiologist: Dina Rich, MD   Subjective   Patient seen examined at her bedside .  He was awake in bed.  Inpatient Medications    Scheduled Meds:  acidophilus  2 capsule Oral TID   apixaban  5 mg Oral BID   arformoterol  15 mcg Nebulization BID   budesonide (PULMICORT) nebulizer solution  0.5 mg Nebulization BID   buPROPion  100 mg Oral TID   Chlorhexidine Gluconate Cloth  6 each Topical Q0600   cholecalciferol  1,000 Units Oral Daily   clonazePAM  0.5 mg Oral BID   DULoxetine  30 mg Oral Daily   feeding supplement  237 mL Oral BID BM   guaiFENesin-dextromethorphan  10 mL Oral Q8H   insulin aspart  0-15 Units Subcutaneous Q4H   levothyroxine  25 mcg Oral Q0600   metoprolol tartrate  50 mg Oral Q8H   pantoprazole  40 mg Oral Daily   [START ON 10/26/2023] predniSONE  60 mg Oral Q breakfast   Followed by   Melene Muller ON 10/23/2023] predniSONE  50 mg Oral Q breakfast   Followed by   Melene Muller ON 10/30/2023] predniSONE  40 mg Oral Q breakfast   Followed by   Melene Muller ON 11/01/2023] predniSONE  20 mg Oral Q breakfast   Followed by   Melene Muller ON 11/03/2023] predniSONE  10 mg Oral Q breakfast   QUEtiapine  50 mg Oral QHS   revefenacin  175 mcg Nebulization Daily   senna-docusate  1 tablet Oral BID   Continuous Infusions:  ceFEPime (MAXIPIME) IV 2 g (10/25/23 0922)   diltiazem (CARDIZEM) infusion 15 mg/hr (10/25/23 0600)   doxycycline (VIBRAMYCIN) IV Stopped (10/24/23 2307)   potassium PHOSPHATE IVPB (in mmol)     PRN Meds: acetaminophen **OR** acetaminophen, docusate sodium, hydrOXYzine, levalbuterol, ondansetron **OR** ondansetron (ZOFRAN) IV, polyethylene glycol   Vital Signs    Vitals:   10/25/23 0600 10/25/23 0719 10/25/23 0733 10/25/23 0858  BP: 104/60     Pulse: (!) 124  (!) 114 (!) 122  Resp: 19   (!) 22  Temp:  98.4 F (36.9 C)    TempSrc:  Oral    SpO2: 98%   93%  Weight:       Height:        Intake/Output Summary (Last 24 hours) at 10/25/2023 0930 Last data filed at 10/25/2023 0700 Gross per 24 hour  Intake 1067.9 ml  Output 3650 ml  Net -2582.1 ml   Filed Weights   10/21/23 2300 10/24/23 0500 10/25/23 0500  Weight: 82.3 kg 82.6 kg 82.9 kg    Telemetry    Atrial fibrillation rapid ventricular rate - Personally Reviewed  ECG    None today  - Personally Reviewed  Physical Exam   General: Comfortable, on bipap Head: Atraumatic, normal size  Eyes: PEERLA, EOMI  Neck: Supple, normal JVD Cardiac: Normal S1, S2; RRR; no murmurs, rubs, or gallops Lungs: Clear to auscultation bilaterally Abd: Soft, nontender, no hepatomegaly  Ext: warm, no edema Musculoskeletal: No deformities, BUE and BLE strength normal and equal Skin: Warm and dry, no rashes   Neuro: Alert and oriented to person, place, time, and situation, CNII-XII grossly intact, no focal deficits  Psych: Normal mood and affect   Labs    Chemistry Recent Labs  Lab 10/20/23 0517 10/22/23 0449 10/23/23 0534 10/24/23 0351 10/25/23 0247  NA 136 136 135 138 137  K 4.2  3.3* 3.4* 3.4* 3.4*  CL 100 102 96*  --  96*  CO2 27 27 28   --  30  GLUCOSE 133* 205* 230*  --  150*  BUN 15 21 26*  --  35*  CREATININE 1.02* 0.83 0.84  --  1.25*  CALCIUM 9.1 8.8* 8.9  --  9.1  PROT 5.8*  --   --   --   --   ALBUMIN 2.8*  --   --   --   --   AST 40  --   --   --   --   ALT 36  --   --   --   --   ALKPHOS 98  --   --   --   --   BILITOT 2.1*  --   --   --   --   GFRNONAA 55* >60 >60  --  43*  ANIONGAP 9 7 11   --  11     Hematology Recent Labs  Lab 10/22/23 0449 10/23/23 0534 10/24/23 0351 10/25/23 0247  WBC 4.8 5.9  --  4.2  RBC 3.33* 3.37*  --  2.86*  HGB 10.6* 10.6* 9.5* 9.1*  HCT 32.0* 32.3* 28.0* 26.8*  MCV 96.1 95.8  --  93.7  MCH 31.8 31.5  --  31.8  MCHC 33.1 32.8  --  34.0  RDW 15.9* 15.9*  --  15.5  PLT 91* 105*  --  86*    Cardiac EnzymesNo results for input(s):  "TROPONINI" in the last 168 hours. No results for input(s): "TROPIPOC" in the last 168 hours.   BNP Recent Labs  Lab 10/20/23 0517 10/21/23 1556 10/24/23 0250  BNP 859.0* 713.0* 673.4*     DDimer No results for input(s): "DDIMER" in the last 168 hours.   Radiology    DG Chest Port 1 View Result Date: 10/24/2023 CLINICAL DATA:  Respiratory failure. EXAM: PORTABLE CHEST 1 VIEW COMPARISON:  10/23/2023 FINDINGS: Left chest wall pacer with lead in the right ventricle, unchanged. Stable cardiomediastinal contours. Small effusions appear similar to previous exam. Unchanged appearance of bilateral interstitial opacities, scarring and architectural distortion. IMPRESSION: 1. No significant change in the appearance of the chest. 2. Small effusions and bilateral interstitial opacities. Electronically Signed   By: Signa Kell M.D.   On: 10/24/2023 08:42    Cardiac Studies  Echo reviewed   Patient Profile     83 y.o. 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  She is currently on Cardizem drip and her rate is now well-controlled.  Off BiPAP, please restart her Lopressor heart rates in the 110s to 120s.  Systolic blood pressure is fluctuating and mostly being in the low 100s, recommend starting her Lopressor 25 mg Q8 for now.  Agree with holding her Lasix today, creatinine 1.25 today this is a jump from yesterday.  She has had a negative balance she has had a  Foley catheter placed due to urinary retention.  Most likely her hypoxia is driven by her acute pulmonary or likely  chronic pulmonary condition giving the characteristics of her x-ray.  Please keep her potassium above 4 and her magnesium above 2.  Will continue to follow with you   For questions or updates, please contact [facility] Please consult [email or web address] for contact info under Cardiology/STEMI.      Signed, Taige Housman, DO  10/25/2023, 9:30 AM

## 2023-10-25 NOTE — Evaluation (Signed)
 Physical Therapy Evaluation Patient Details Name: Lauren Baker MRN: [id number] DOB: 1941-01-04 Today's Date: 10/25/2023  History of Present Illness  Pt is an 83 y.o. female who presented 10/20/23 with SOB and hypoxia. Admitted with afib with RVR and acute hypoxemic respiratory failure due to influenza A pneumonia. Of note, pt had a recent hospital admission from 09/21/2023 to 09/24/2023 for acute respiratory failure. PMH: afib, HTN   Clinical Impression  Pt presents with condition above and deficits mentioned below, see PT Problem List. Pt recently was admitted 2/10-2/13 for acute respiratory failure then discharged to a SNF for rehab. Prior to that, she was mod I for functional mobility utilizing a QC. She was living alone in a 1-level house with a ramped entrance. She has 24/7 care available at d/c. Currently, the pt is eager to participate and improve, demonstrating deficits in gross strength, power, activity tolerance/endurance, and balance. She is requiring CGA for bed mobility, modA to transfer sit to stand, and minA to take a few steps with a RW. Pt stood in the front of the recliner for several minutes, performing heel raises, before sitting. Gait distance limited this date due to HHFNC line. She is motivated to participate and improve and has good family support at home. She has had a drastic functional decline as well. Thus, she may benefit from intensive inpatient rehab, > 3 hours/day. Will continue to follow acutely.      If plan is discharge home, recommend the following: A lot of help with walking and/or transfers;A lot of help with bathing/dressing/bathroom;Assistance with cooking/housework;Assist for transportation   Can travel by private vehicle        Equipment Recommendations BSC/3in1  Recommendations for Other Services  Rehab consult;OT consult    Functional Status Assessment Patient has had a recent decline in their functional status and demonstrates the ability to make  significant improvements in function in a reasonable and predictable amount of time.     Precautions / Restrictions Precautions Precautions: Fall;Other (comment) Recall of Precautions/Restrictions: Intact Precaution/Restrictions Comments: watch SpO2 (on HHFNC) & HR Restrictions Weight Bearing Restrictions Per Provider Order: No      Mobility  Bed Mobility Overal bed mobility: Needs Assistance Bed Mobility: Supine to Sit     Supine to sit: Contact guard, HOB elevated, Used rails     General bed mobility comments: Extra time and cues provided to bring legs off L EOB and pull on rails to ascend trunk to sit up EOB, CGA for safety    Transfers Overall transfer level: Needs assistance Equipment used: Rolling walker (2 wheels) Transfers: Sit to/from Stand, Bed to chair/wheelchair/BSC Sit to Stand: Mod assist   Step pivot transfers: Min assist       General transfer comment: Cues provided for pt to push up from the bed to stand and then reach back for the chair to sit. Extra time and effort needed to extend hips and trunk to power up to stand, needing modA. MinA for balance and RW management when step pivoting to L from bed to recliner.    Ambulation/Gait Ambulation/Gait assistance: Min assist Gait Distance (Feet): 3 Feet Assistive device: Rolling walker (2 wheels) Gait Pattern/deviations: Step-through pattern, Decreased step length - right, Decreased step length - left, Decreased stride length, Trunk flexed Gait velocity: reduced Gait velocity interpretation: <1.31 ft/sec, indicative of household ambulator   General Gait Details: Pt takes slow, small steps with a RW to transfer from the bed to the recliner. MinA needed for balance and  guiding the RW to turn and avoid obstacles. Distance limited by HHFNC tubing.  Stairs            Wheelchair Mobility     Tilt Bed    Modified Rankin (Stroke Patients Only)       Balance Overall balance assessment: Needs  assistance Sitting-balance support: No upper extremity supported, Feet supported Sitting balance-Leahy Scale: Good Sitting balance - Comments: Able to reach off COG to try to donn socks with legs crossed while sitting EOB, CGA for safety   Standing balance support: Bilateral upper extremity supported, During functional activity, Reliant on assistive device for balance Standing balance-Leahy Scale: Poor Standing balance comment: reliant on RW and up to minA for standing balance                             Pertinent Vitals/Pain Pain Assessment Pain Assessment: Faces Faces Pain Scale: Hurts a little bit Pain Location: generalized with mobility Pain Descriptors / Indicators: Discomfort, Grimacing Pain Intervention(s): Limited activity within patient's tolerance, Monitored during session, Repositioned    Home Living Family/patient expects to be discharged to:: Private residence Living Arrangements: Alone Available Help at Discharge: Family;Available 24 hours/day Type of Home: House Home Access: Ramped entrance       Home Layout: One level Home Equipment: Cane - quad;Shower seat;Hand held Programmer, systems (2 wheels) Additional Comments: just recently went to SNF after admission 2/10-2/13    Prior Function Prior Level of Function : Independent/Modified Independent             Mobility Comments: independent with QC intermittently for household ambulation and short distance community ambulation; recently just went to SNF for rehab though ADLs Comments: independent- reports sponge bath level     Extremity/Trunk Assessment   Upper Extremity Assessment Upper Extremity Assessment: Defer to OT evaluation    Lower Extremity Assessment Lower Extremity Assessment: Generalized weakness    Cervical / Trunk Assessment Cervical / Trunk Assessment: Kyphotic  Communication   Communication Communication: No apparent difficulties    Cognition Arousal:  Alert Behavior During Therapy: WFL for tasks assessed/performed   PT - Cognitive impairments: No apparent impairments                       PT - Cognition Comments: Follows simple cues with extra time, but overall seems appropriate for tasks performed this date. Following commands: Intact       Cueing Cueing Techniques: Verbal cues, Tactile cues     General Comments General comments (skin integrity, edema, etc.): HR up to 120s, SpO2 pleth unreliable, on HHFNC 60%; provided pt with IS    Exercises  Standing heel raises with RW support, x5 reps bil   Assessment/Plan    PT Assessment Patient needs continued PT services  PT Problem List Decreased strength;Decreased activity tolerance;Decreased balance;Decreased mobility;Cardiopulmonary status limiting activity       PT Treatment Interventions DME instruction;Gait training;Stair training;Functional mobility training;Therapeutic activities;Therapeutic exercise;Balance training;Neuromuscular re-education;Patient/family education    PT Goals (Current goals can be found in the Care Plan section)  Acute Rehab PT Goals Patient Stated Goal: to improve PT Goal Formulation: With patient/family Time For Goal Achievement: 11/08/23 Potential to Achieve Goals: Good    Frequency Min 2X/week     Co-evaluation               AM-PAC PT "6 Clicks" Mobility  Outcome Measure Help needed turning from your back  to your side while in a flat bed without using bedrails?: A Little Help needed moving from lying on your back to sitting on the side of a flat bed without using bedrails?: A Little Help needed moving to and from a bed to a chair (including a wheelchair)?: A Little Help needed standing up from a chair using your arms (e.g., wheelchair or bedside chair)?: A Lot Help needed to walk in hospital room?: Total (<20 ft) Help needed climbing 3-5 steps with a railing? : Total 6 Click Score: 13    End of Session Equipment Utilized  During Treatment: Gait belt;Oxygen Activity Tolerance: Patient tolerated treatment well Patient left: in chair;with call bell/phone within reach;with chair alarm set;with family/visitor present;with nursing/sitter in room Nurse Communication: Mobility status;Other (comment) (vitals) PT Visit Diagnosis: Unsteadiness on feet (R26.81);Other abnormalities of gait and mobility (R26.89);Muscle weakness (generalized) (M62.81);Difficulty in walking, not elsewhere classified (R26.2)    Time: 1610-9604 PT Time Calculation (min) (ACUTE ONLY): 38 min   Charges:   PT Evaluation $PT Eval Moderate Complexity: 1 Mod PT Treatments $Therapeutic Activity: 23-37 mins PT General Charges $$ ACUTE PT VISIT: 1 Visit         Virgil Benedict, PT, DPT Acute Rehabilitation Services  Office: [phone]   Bettina Gavia 10/25/2023, 2:12 PM

## 2023-10-26 DIAGNOSIS — I482 Chronic atrial fibrillation, unspecified: Secondary | ICD-10-CM

## 2023-10-26 DIAGNOSIS — R339 Retention of urine, unspecified: Secondary | ICD-10-CM

## 2023-10-26 DIAGNOSIS — J9601 Acute respiratory failure with hypoxia: Secondary | ICD-10-CM | POA: Diagnosis not present

## 2023-10-26 DIAGNOSIS — J09X1 Influenza due to identified novel influenza A virus with pneumonia: Secondary | ICD-10-CM | POA: Diagnosis not present

## 2023-10-26 LAB — CBC
HCT: 29.8 % — ABNORMAL LOW (ref 36.0–46.0)
Hemoglobin: 10.2 g/dL — ABNORMAL LOW (ref 12.0–15.0)
MCH: 32 pg (ref 26.0–34.0)
MCHC: 34.2 g/dL (ref 30.0–36.0)
MCV: 93.4 fL (ref 80.0–100.0)
Platelets: 102 10*3/uL — ABNORMAL LOW (ref 150–400)
RBC: 3.19 MIL/uL — ABNORMAL LOW (ref 3.87–5.11)
RDW: 15.4 % (ref 11.5–15.5)
WBC: 7.3 10*3/uL (ref 4.0–10.5)
nRBC: 0 % (ref 0.0–0.2)

## 2023-10-26 LAB — BASIC METABOLIC PANEL
Anion gap: 7 (ref 5–15)
BUN: 36 mg/dL — ABNORMAL HIGH (ref 8–23)
CO2: 28 mmol/L (ref 22–32)
Calcium: 9.1 mg/dL (ref 8.9–10.3)
Chloride: 100 mmol/L (ref 98–111)
Creatinine, Ser: 0.95 mg/dL (ref 0.44–1.00)
GFR, Estimated: >60 mL/min (ref >60)
Glucose, Bld: 155 mg/dL — ABNORMAL HIGH (ref 70–99)
Potassium: 4.1 mmol/L (ref 3.5–5.1)
Sodium: 135 mmol/L (ref 135–145)

## 2023-10-26 LAB — GLUCOSE, CAPILLARY
Glucose-Capillary: 157 mg/dL — ABNORMAL HIGH (ref 70–99)
Glucose-Capillary: 147 mg/dL — ABNORMAL HIGH (ref 70–99)
Glucose-Capillary: 187 mg/dL — ABNORMAL HIGH (ref 70–99)
Glucose-Capillary: 245 mg/dL — ABNORMAL HIGH (ref 70–99)
Glucose-Capillary: 286 mg/dL — ABNORMAL HIGH (ref 70–99)
Glucose-Capillary: 254 mg/dL — ABNORMAL HIGH (ref 70–99)

## 2023-10-26 LAB — PHOSPHORUS: Phosphorus: 3.4 mg/dL (ref 2.5–4.6)

## 2023-10-26 LAB — ALDOLASE: Aldolase: 12.8 U/L — ABNORMAL HIGH (ref 3.3–10.3)

## 2023-10-26 LAB — MAGNESIUM: Magnesium: 2.2 mg/dL (ref 1.7–2.4)

## 2023-10-26 MED ORDER — ENSURE ENLIVE PO LIQD
237.0000 mL | Freq: Three times a day (TID) | ORAL | Status: DC
  Administered 2023-10-26 – 2023-10-27 (×3): 237 mL via ORAL

## 2023-10-26 MED ORDER — LEVALBUTEROL HCL 0.63 MG/3ML IN NEBU
0.6300 mg | INHALATION_SOLUTION | Freq: Four times a day (QID) | RESPIRATORY_TRACT | Status: AC
  Administered 2023-10-26 – 2023-10-27 (×3): 0.63 mg via RESPIRATORY_TRACT
  Filled 2023-10-26 (×3): qty 3

## 2023-10-26 MED ORDER — METOPROLOL TARTRATE 25 MG PO TABS
25.0000 mg | ORAL_TABLET | Freq: Once | ORAL | Status: AC
  Administered 2023-10-27: 25 mg via ORAL
  Filled 2023-10-26: qty 1

## 2023-10-26 MED ORDER — GUAIFENESIN 100 MG/5ML PO LIQD
5.0000 mL | ORAL | Status: DC | PRN
  Administered 2023-10-26: 5 mL via ORAL
  Filled 2023-10-26: qty 5

## 2023-10-26 MED ORDER — METOPROLOL TARTRATE 25 MG PO TABS
75.0000 mg | ORAL_TABLET | Freq: Three times a day (TID) | ORAL | Status: DC
  Administered 2023-10-27: 75 mg via ORAL
  Filled 2023-10-26: qty 3

## 2023-10-26 MED ORDER — METOPROLOL TARTRATE 25 MG PO TABS
25.0000 mg | ORAL_TABLET | Freq: Once | ORAL | Status: AC
  Administered 2023-10-26: 25 mg via ORAL
  Filled 2023-10-26: qty 1

## 2023-10-26 MED ORDER — BETHANECHOL CHLORIDE 10 MG PO TABS
10.0000 mg | ORAL_TABLET | Freq: Three times a day (TID) | ORAL | Status: DC
Start: 2023-10-26 — End: 2023-10-29
  Administered 2023-10-26 – 2023-10-27 (×4): 10 mg via ORAL
  Filled 2023-10-26 (×4): qty 1

## 2023-10-26 MED ORDER — METOPROLOL TARTRATE 25 MG PO TABS
50.0000 mg | ORAL_TABLET | Freq: Three times a day (TID) | ORAL | Status: DC
  Administered 2023-10-26 (×2): 50 mg via ORAL
  Filled 2023-10-26 (×2): qty 2

## 2023-10-26 NOTE — Plan of Care (Signed)
  Problem: Activity: Goal: Risk for activity intolerance will decrease Outcome: Progressing   Problem: Coping: Goal: Level of anxiety will decrease Outcome: Progressing   Problem: Clinical Measurements: Goal: Will remain free from infection Outcome: Progressing

## 2023-10-26 NOTE — Progress Notes (Signed)
 Initial Nutrition Assessment  DOCUMENTATION CODES:  Not applicable  INTERVENTION:  Continue current diet as ordered per SLP Ordering assistance Ensure Enlive po TID, each supplement provides 350 kcal and 20 grams of protein. Mighty Shake TID with meals, each supplement provides 330 kcals and 9 grams of protein Magic cup TID with meals, each supplement provides 290 kcal and 9 grams of protein Kcal count x 48 hours If intake remains inadequate to support needs, consider cortrak placement on Wednesday  NUTRITION DIAGNOSIS:  Inadequate oral intake related to poor appetite (WOB) as evidenced by meal completion < 50%, per patient/family report.  GOAL:  Patient will meet greater than or equal to 90% of their needs  MONITOR:  PO intake, Supplement acceptance, I & O's, Labs, Weight trends  REASON FOR ASSESSMENT:  Consult Assessment of nutrition requirement/status, Calorie Count  ASSESSMENT:  Pt with hx of atrial fibrillation, HTN, GERD, and recent admission for respiratory failure presented to ED from [facility] with SOB. Positive for influenza A.  3/11 - presented [facility] ED from [facility] 3/14 - transferred to [facility] ICU  3/15 - BSE, DYS 3, thin liquid  Pt resting in bedside chair at the time of assessment, son at bedside assists with hx. Discussed recent intake and nutrition hx.  Son reports that up until recent acute illness and admission at [facility] pt had been healthy with no significant medical problems. Reports that pt was making some progress while at the [facility] and appetite had been fair.   Pt currently requiring a significant amount of O2. Appetite has been poor mostly in the context of difficulty coordinating breathing with chewing and swallowing. Pt reports that that drinking is easier than eating, encouraged her to consume her ensures and will also add additional supplements to tray. Encouraged her to try and prioritize ensure over taking in small  amounts of solid food as it is more calorically dense. Agreeable to magic cup as well.  SLP did talk with pt and family about a cortrak as there is concern with pt's ability to meet nutrition needs with tenuous breathing and poor PO appetite. Further discussed tube with family and reiterated that nutrition would be important to maintain to aid in recovery and preservation of lean body mass. Will conduct kcal count x 48 hours to provide family with objective data. If needs are not met, family will consider cortrak on Wednesday.   Admit weight: 79.8 kg ? Accuracy, copied from last admission  Current weight: 82.9 kg   Intake/Output Summary (Last 24 hours) at 10/26/2023 1406 Last data filed at 10/26/2023 1000 Gross per 24 hour  Intake 880.79 ml  Output 650 ml  Net 230.79 ml  Net IO Since Admission: 749.04 mL [10/26/23 1406]  Drains/Lines: UOP  Average Meal Intake: 3/11-3/16: 41% intake x 7 recorded meals  Nutritionally Relevant Medications: Scheduled Meds:  acidophilus  2 capsule Oral TID   cholecalciferol  1,000 Units Oral Daily   doxycycline  100 mg Oral Q12H   Ensure Enlive  237 mL Oral BID BM   insulin aspart  0-20 Units Subcutaneous Q4H   pantoprazole  40 mg Oral Daily   predniSONE  60 mg Oral Q breakfast   senna-docusate  1 tablet Oral BID   Continuous Infusions:  ceFEPime (MAXIPIME) IV Stopped (10/26/23 0822)   PRN Meds: docusate sodium, ondansetron, polyethylene glycol  Labs Reviewed: BUN 36 CBG ranges from 147-250 mg/dL over the last 24 hours HgbA1c 6.4%  NUTRITION - FOCUSED PHYSICAL EXAM: Flowsheet Row Most Recent Value  Orbital Region Mild depletion  Upper Arm Region No depletion  Thoracic and Lumbar Region No depletion  Buccal Region Unable to assess  Temple Region Mild depletion  Clavicle Bone Region Mild depletion  Clavicle and Acromion Bone Region Moderate depletion  Scapular Bone Region Moderate depletion  Dorsal Hand Severe depletion  Patellar  Region Severe depletion  Anterior Thigh Region Severe depletion  Posterior Calf Region Severe depletion  Edema (RD Assessment) Mild  [BLE]  Hair Reviewed  Eyes Reviewed  Mouth Reviewed  Skin Reviewed  Nails Reviewed    Diet Order:   Diet Order             DIET DYS 3 Room service appropriate? Yes; Fluid consistency: Thin  Diet effective now                   EDUCATION NEEDS:  Education needs have been addressed  Skin:  Skin Assessment: Reviewed RN Assessment  Last BM:  3/15  Height:  Ht Readings from Last 1 Encounters:  10/23/23 5\' 9"  (1.753 m)    Weight:  Wt Readings from Last 1 Encounters:  10/25/23 82.9 kg    Ideal Body Weight:  65.9 kg  BMI:  Body mass index is 26.99 kg/m.  Estimated Nutritional Needs:  Kcal:  1500-1700 kcal/d Protein:  75-90g/d Fluid:  1.5-1.7L/d    Greig Castilla, RD, LDN Registered Dietitian II Please reach out via secure chat Weekend on-call pager # available in [facility]

## 2023-10-26 NOTE — [place] FL2 (Signed)
 [place] MEDICAID FL2 LEVEL OF CARE FORM     IDENTIFICATION  Patient Name: Lauren Baker Birthdate: [birth year] Sex: female Admission Date (Current Location): 10/20/2023  [facility] and [place] Number:  [employer] and Address:  [facility] [place], [address] [place], [place] [address]      Provider Number: [id number]  Attending Physician Name and Address:  Charlott Holler, MD  Relative Name and Phone Number:  Laurann Montana; Daughter; [phone]    Current Level of Care: Hospital Recommended Level of Care: Skilled Nursing Facility Prior Approval Number:    Date Approved/Denied:   PASRR Number: [id number]  Discharge Plan: SNF    Current Diagnoses: Patient Active Problem List   Diagnosis Date Noted   Acute respiratory failure with hypoxia (HCC) 10/20/2023   Influenza A with pneumonia 10/20/2023   Pancytopenia (HCC) 10/20/2023   Chronic kidney disease, stage 3b (HCC) 10/20/2023   Unspecified protein-calorie malnutrition (HCC) 09/30/2023   Acquired hypothyroidism 09/29/2023   Depression, major, recurrent, severe with psychosis (HCC) 09/29/2023   Aortic atherosclerosis (HCC) 09/29/2023   Gastroesophageal reflux disease 09/24/2023   Respiratory failure with hypoxia (HCC) 09/21/2023   Pacemaker 03/04/2022   Chronic atrial fibrillation (HCC) 03/12/2018   Primary hypertension 03/12/2018    Orientation RESPIRATION BLADDER Height & Weight     Self, Time, Situation, Place  O2 (HHFNC 30L and weaning) Incontinent, Indwelling catheter Weight: 182 lb 12.2 oz (82.9 kg) Height:  5\' 9"  (175.3 cm)  BEHAVIORAL SYMPTOMS/MOOD NEUROLOGICAL BOWEL NUTRITION STATUS      Incontinent Diet (Please see dc summary)  AMBULATORY STATUS COMMUNICATION OF NEEDS Skin   Extensive Assist Verbally Normal                       Personal Care Assistance Level of Assistance  Bathing, Dressing, Feeding Bathing Assistance: Maximum assistance Feeding  assistance: Maximum assistance Dressing Assistance: Maximum assistance     Functional Limitations Info  Sight Sight Info: Impaired (Right Eye)        SPECIAL CARE FACTORS FREQUENCY  PT (By licensed PT), OT (By licensed OT), Speech therapy     PT Frequency: 5x OT Frequency: 5x     Speech Therapy Frequency: 3x      Contractures Contractures Info: Not present    Additional Factors Info  Code Status, Allergies, Isolation Precautions, Psychotropic, Insulin Sliding Scale Code Status Info: Full Code Allergies Info: NKA Psychotropic Info: Please see dc summary Insulin Sliding Scale Info: Please see dc summary Isolation Precautions Info: Droplet Precautions (Influenza A with pneumonia 10/20/2023)     Current Medications (10/26/2023):  This is the current hospital active medication list Current Facility-Administered Medications  Medication Dose Route Frequency Provider Last Rate Last Admin   acetaminophen (TYLENOL) tablet 650 mg  650 mg Oral Q6H PRN Shahmehdi, Seyed A, MD       Or   acetaminophen (TYLENOL) suppository 650 mg  650 mg Rectal Q6H PRN Shahmehdi, Seyed A, MD       acidophilus (RISAQUAD) capsule 2 capsule  2 capsule Oral TID Nevin Bloodgood A, MD   2 capsule at 10/26/23 1048   apixaban (ELIQUIS) tablet 5 mg  5 mg Oral BID Shahmehdi, Seyed A, MD   5 mg at 10/26/23 1047   arformoterol (BROVANA) nebulizer solution 15 mcg  15 mcg Nebulization BID Nevin Bloodgood A, MD   15 mcg at 10/25/23 1946   bethanechol (URECHOLINE) tablet 10 mg  10 mg Oral  TID Morene Crocker, MD   10 mg at 10/26/23 1049   budesonide (PULMICORT) nebulizer solution 0.5 mg  0.5 mg Nebulization BID Nevin Bloodgood A, MD   0.5 mg at 10/26/23 0742   buPROPion [facility]) tablet 100 mg  100 mg Oral TID Nevin Bloodgood A, MD   100 mg at 10/26/23 1045   ceFEPIme (MAXIPIME) 2 g in sodium chloride 0.9 % 100 mL IVPB  2 g Intravenous Q12H Calton Dach I, [facility]   Stopped at 10/26/23 4098    Chlorhexidine Gluconate Cloth 2 % PADS 6 each  6 each Topical Q0600 Kendell Bane, MD   6 each at 10/26/23 0753   cholecalciferol (VITAMIN D3) 25 MCG (1000 UNIT) tablet 1,000 Units  1,000 Units Oral Daily Nevin Bloodgood A, MD   1,000 Units at 10/26/23 1046   clonazePAM (KLONOPIN) tablet 0.5 mg  0.5 mg Oral BID Nevin Bloodgood A, MD   0.5 mg at 10/26/23 1048   dextromethorphan-guaiFENesin (MUCINEX DM) 30-600 MG per 12 hr tablet 1 tablet  1 tablet Oral BID Calton Dach I, RPH   1 tablet at 10/26/23 1046   diltiazem (CARDIZEM) 125 mg in dextrose 5% 125 mL (1 mg/mL) infusion  5-15 mg/hr Intravenous Titrated Paliwal, Eliezer Lofts, MD 15 mL/hr at 10/26/23 1000 15 mg/hr at 10/26/23 1000   docusate sodium (COLACE) capsule 100 mg  100 mg Oral BID PRN Cristopher Peru, PA-C       doxycycline (VIBRA-TABS) tablet 100 mg  100 mg Oral Q12H Calton Dach I, RPH   100 mg at 10/26/23 1049   DULoxetine (CYMBALTA) DR capsule 30 mg  30 mg Oral Daily Shahmehdi, Seyed A, MD   30 mg at 10/26/23 1046   feeding supplement (ENSURE ENLIVE / ENSURE PLUS) liquid 237 mL  237 mL Oral TID with meals Charlott Holler, MD       guaiFENesin (ROBITUSSIN) 100 MG/5ML liquid 5 mL  5 mL Oral Q4H PRN Morene Crocker, MD       hydrOXYzine (ATARAX) tablet 25 mg  25 mg Oral TID PRN Nevin Bloodgood A, MD   25 mg at 10/23/23 1951   insulin aspart (novoLOG) injection 0-20 Units  0-20 Units Subcutaneous Q4H Mannam, Praveen, MD   4 Units at 10/26/23 1229   levalbuterol (XOPENEX) nebulizer solution 0.63 mg  0.63 mg Nebulization Q6H Morene Crocker, MD   0.63 mg at 10/26/23 1401   levothyroxine (SYNTHROID) tablet 25 mcg  25 mcg Oral Q0600 Nevin Bloodgood A, MD   25 mcg at 10/26/23 0554   metoprolol tartrate (LOPRESSOR) tablet 50 mg  50 mg Oral Q8H Charlott Holler, MD       ondansetron [facility]) tablet 4 mg  4 mg Oral Q6H PRN Shahmehdi, Seyed A, MD       Or   ondansetron (ZOFRAN) injection 4 mg  4 mg Intravenous Q6H PRN  Shahmehdi, Seyed A, MD       Oral care mouth rinse  15 mL Mouth Rinse PRN Mannam, Praveen, MD       pantoprazole (PROTONIX) EC tablet 40 mg  40 mg Oral Daily Shahmehdi, Seyed A, MD   40 mg at 10/26/23 1049   polyethylene glycol (MIRALAX / GLYCOLAX) packet 17 g  17 g Oral Daily PRN Cristopher Peru, PA-C       predniSONE (DELTASONE) tablet 60 mg  60 mg Oral Q breakfast Morene Crocker, MD   60 mg at 10/26/23 0753   Followed by   [  START ON 10/18/2023] predniSONE (DELTASONE) tablet 50 mg  50 mg Oral Q breakfast Morene Crocker, MD       Followed by   Melene Muller ON 10/30/2023] predniSONE (DELTASONE) tablet 40 mg  40 mg Oral Q breakfast Morene Crocker, MD       Followed by   Melene Muller ON 11/01/2023] predniSONE (DELTASONE) tablet 20 mg  20 mg Oral Q breakfast Morene Crocker, MD       Followed by   Melene Muller ON 11/03/2023] predniSONE (DELTASONE) tablet 10 mg  10 mg Oral Q breakfast Morene Crocker, MD       QUEtiapine (SEROQUEL) tablet 50 mg  50 mg Oral QHS Shahmehdi, Seyed A, MD   50 mg at 10/25/23 2001   revefenacin (YUPELRI) nebulizer solution 175 mcg  175 mcg Nebulization Daily Nevin Bloodgood A, MD   175 mcg at 10/26/23 1610   senna-docusate (Senokot-S) tablet 1 tablet  1 tablet Oral BID Kendell Bane, MD   1 tablet at 10/26/23 1046     Discharge Medications: Please see discharge summary for a list of discharge medications.  Relevant Imaging Results:  Relevant Lab Results:   Additional Information SSN: [id number]  Jameila Keeny E Daesha Insco, LCSW

## 2023-10-26 NOTE — Progress Notes (Signed)
 eLink Physician-Brief Progress Note Patient Name: ERMINA OBERMAN DOB: 1940/08/19 MRN: [id number]   Date of Service  10/26/2023  HPI/Events of Note  chronic with severe bi atrial enlargement  known afib, on cardizem @ 15 and got her scheduled metoprolol @ 10PM HR still 120-140.   eICU Interventions  One time metop tartrate 25mg  Increase scheduled metop to 75mg  Q8H     Intervention Category Intermediate Interventions: Arrhythmia - evaluation and management  Carliss Quast 10/26/2023, 11:59 PM

## 2023-10-26 NOTE — Progress Notes (Signed)
 Progress Note  Patient Name: Lauren Baker Date of Encounter: 10/26/2023  Primary Cardiologist: Dina Rich, MD   Subjective   Son at bed side patient with some encephalopathy  Inpatient Medications    Scheduled Meds:  acidophilus  2 capsule Oral TID   apixaban  5 mg Oral BID   arformoterol  15 mcg Nebulization BID   bethanechol  10 mg Oral TID   budesonide (PULMICORT) nebulizer solution  0.5 mg Nebulization BID   buPROPion  100 mg Oral TID   Chlorhexidine Gluconate Cloth  6 each Topical Q0600   cholecalciferol  1,000 Units Oral Daily   clonazePAM  0.5 mg Oral BID   dextromethorphan-guaiFENesin  1 tablet Oral BID   doxycycline  100 mg Oral Q12H   DULoxetine  30 mg Oral Daily   feeding supplement  237 mL Oral BID BM   insulin aspart  0-20 Units Subcutaneous Q4H   levalbuterol  0.63 mg Nebulization Q6H   levothyroxine  25 mcg Oral Q0600   metoprolol tartrate  50 mg Oral Q8H   pantoprazole  40 mg Oral Daily   predniSONE  60 mg Oral Q breakfast   Followed by   Melene Muller ON 10/22/2023] predniSONE  50 mg Oral Q breakfast   Followed by   Melene Muller ON 10/30/2023] predniSONE  40 mg Oral Q breakfast   Followed by   Melene Muller ON 11/01/2023] predniSONE  20 mg Oral Q breakfast   Followed by   Melene Muller ON 11/03/2023] predniSONE  10 mg Oral Q breakfast   QUEtiapine  50 mg Oral QHS   revefenacin  175 mcg Nebulization Daily   senna-docusate  1 tablet Oral BID   Continuous Infusions:  ceFEPime (MAXIPIME) IV 200 mL/hr at 10/26/23 0800   diltiazem (CARDIZEM) infusion 15 mg/hr (10/26/23 0902)   PRN Meds: acetaminophen **OR** acetaminophen, docusate sodium, guaiFENesin, hydrOXYzine, ondansetron **OR** ondansetron (ZOFRAN) IV, mouth rinse, polyethylene glycol   Vital Signs    Vitals:   10/26/23 0700 10/26/23 0719 10/26/23 0742 10/26/23 0800  BP: 130/89   (!) 108/94  Pulse: (!) 114  (!) 129 (!) 134  Resp: (!) 24  (!) 22 (!) 22  Temp:  97.6 F (36.4 C)    TempSrc:  Oral    SpO2: 99%   99% 93%  Weight:      Height:        Intake/Output Summary (Last 24 hours) at 10/26/2023 1013 Last data filed at 10/26/2023 0800 Gross per 24 hour  Intake 1489.44 ml  Output 650 ml  Net 839.44 ml   Filed Weights   10/21/23 2300 10/24/23 0500 10/25/23 0500  Weight: 82.3 kg 82.6 kg 82.9 kg    Telemetry    Atrial fibrillation rapid ventricular rate - Personally Reviewed  ECG    None today  - Personally Reviewed  Physical Exam   Elderly female Basilar atelectasis  No murmur PPM under left clavicle  Abdomen benign Trace edema  Labs    Chemistry Recent Labs  Lab 10/20/23 0517 10/22/23 0449 10/23/23 0534 10/24/23 0351 10/25/23 0247 10/26/23 0705  NA 136   < > 135 138 137 135  K 4.2   < > 3.4* 3.4* 3.4* 4.1  CL 100   < > 96*  --  96* 100  CO2 27   < > 28  --  30 28  GLUCOSE 133*   < > 230*  --  150* 155*  BUN 15   < > 26*  --  35* 36*  CREATININE 1.02*   < > 0.84  --  1.25* 0.95  CALCIUM 9.1   < > 8.9  --  9.1 9.1  PROT 5.8*  --   --   --   --   --   ALBUMIN 2.8*  --   --   --   --   --   AST 40  --   --   --   --   --   ALT 36  --   --   --   --   --   ALKPHOS 98  --   --   --   --   --   BILITOT 2.1*  --   --   --   --   --   GFRNONAA 55*   < > >60  --  43* >60  ANIONGAP 9   < > 11  --  11 7   < > = values in this interval not displayed.     Hematology Recent Labs  Lab 10/23/23 0534 10/24/23 0351 10/25/23 0247 10/26/23 0705  WBC 5.9  --  4.2 7.3  RBC 3.37*  --  2.86* 3.19*  HGB 10.6* 9.5* 9.1* 10.2*  HCT 32.3* 28.0* 26.8* 29.8*  MCV 95.8  --  93.7 93.4  MCH 31.5  --  31.8 32.0  MCHC 32.8  --  34.0 34.2  RDW 15.9*  --  15.5 15.4  PLT 105*  --  86* 102*    Cardiac EnzymesNo results for input(s): "TROPONINI" in the last 168 hours. No results for input(s): "TROPIPOC" in the last 168 hours.   BNP Recent Labs  Lab 10/20/23 0517 10/21/23 1556 10/24/23 0250  BNP 859.0* 713.0* 673.4*     DDimer No results for input(s): "DDIMER" in the last  168 hours.   Radiology    No results found.   Cardiac Studies  Echo reviewed 09/22/23 EF 55-60% normal RV expected severe bi atrial enlargement form chronic afib   Patient Profile     83 y.o. 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), HTN and hypothyroidism who is being seen 10/22/2023 for the evaluation of atrial fibrillation with RVR   Assessment & Plan    Afib:  chronic with severe bi atrial enlargement consolidate AV nodal drugs lopressor 50 mg q8 hours continue eliquis Respiratory Failure:  with influenza A CXR 3/15 no change can put back on low dose lasix 20 mg daily to keep I/O even as Cr has normalized On maxipime and steroids still requiring oxygen  Thyroid:  continue synthroid replacement TSH has been normal    For questions or updates, please contact [facility] Please consult [email or web address] for contact info under Cardiology/STEMI.      Signed, Charlton Haws, MD  10/26/2023, 10:13 AM

## 2023-10-26 NOTE — Evaluation (Signed)
 Occupational Therapy Evaluation Patient Details Name: Lauren Baker MRN: [id number] DOB: Apr 09, 1941 Today's Date: 10/26/2023   History of Present Illness   Pt is an 83 y.o. female who presented 10/20/23 with SOB and hypoxia. Admitted with afib with RVR and acute hypoxemic respiratory failure due to influenza A pneumonia. Of note, pt had a recent hospital admission from 09/21/2023 to 09/24/2023 for acute respiratory failure. PMH: afib, HTN     Clinical Impressions Prior to SNF stay, pt reports ind at baseline with ADLs and functional mobility, lived alone but has family in close proximity to assist. Pt currently needing up to max A for ADLs, min A for bed mobility and mod A for pivot transfer (face to face transfer). Pt desatting to high 70s with poor pleth, incr to low-mid 90s once seated in chair with good wave pleth. RN notified, son also present throughout session. Pt presenting with impairments listed below, will follow acutely. Noted AIR declined, Patient will benefit from continued inpatient follow up therapy, <3 hours/day to maximize safety/ind with ADL/functional mobility.      If plan is discharge home, recommend the following:   A lot of help with walking and/or transfers;A lot of help with bathing/dressing/bathroom;Assistance with cooking/housework;Direct supervision/assist for medications management;Direct supervision/assist for financial management;Help with stairs or ramp for entrance;Assist for transportation     Functional Status Assessment   Patient has had a recent decline in their functional status and demonstrates the ability to make significant improvements in function in a reasonable and predictable amount of time.     Equipment Recommendations   Other (comment) (defer)     Recommendations for Other Services   PT consult     Precautions/Restrictions   Precautions Precautions: Fall;Other (comment) Recall of Precautions/Restrictions:  Intact Precaution/Restrictions Comments: watch SpO2 (on HHFNC) & HR Restrictions Weight Bearing Restrictions Per Provider Order: No     Mobility Bed Mobility Overal bed mobility: Needs Assistance Bed Mobility: Supine to Sit     Supine to sit: Min assist          Transfers Overall transfer level: Needs assistance Equipment used: 1 person hand held assist Transfers: Sit to/from Stand, Bed to chair/wheelchair/BSC Sit to Stand: Mod assist Stand pivot transfers: Mod assist         General transfer comment: modA face to face transfer      Balance Overall balance assessment: Needs assistance Sitting-balance support: No upper extremity supported, Feet supported Sitting balance-Leahy Scale: Good Sitting balance - Comments: Able to reach off COG to try to donn socks with legs crossed while sitting EOB, CGA for safety   Standing balance support: Bilateral upper extremity supported, During functional activity, Reliant on assistive device for balance Standing balance-Leahy Scale: Poor Standing balance comment: reliant on RW and up to minA for standing balance                           ADL either performed or assessed with clinical judgement   ADL Overall ADL's : Needs assistance/impaired Eating/Feeding: Set up;Sitting   Grooming: Minimal assistance;Sitting Grooming Details (indicate cue type and reason): using suction Upper Body Bathing: Moderate assistance;Sitting   Lower Body Bathing: Maximal assistance;Sitting/lateral leans   Upper Body Dressing : Moderate assistance;Sitting   Lower Body Dressing: Maximal assistance;Sitting/lateral leans   Toilet Transfer: Moderate assistance;Stand-pivot   Toileting- Clothing Manipulation and Hygiene: Maximal assistance       Functional mobility during ADLs: Moderate assistance  Vision   Vision Assessment?: No apparent visual deficits     Perception Perception: Not tested       Praxis Praxis: Not  tested       Pertinent Vitals/Pain       Extremity/Trunk Assessment Upper Extremity Assessment Upper Extremity Assessment: Generalized weakness   Lower Extremity Assessment Lower Extremity Assessment: Defer to PT evaluation   Cervical / Trunk Assessment Cervical / Trunk Assessment: Kyphotic   Communication Communication Communication: No apparent difficulties   Cognition Arousal: Alert Behavior During Therapy: WFL for tasks assessed/performed Cognition: No apparent impairments                               Following commands: Intact       Cueing  General Comments   Cueing Techniques: Verbal cues;Tactile cues  50% FiO2, SpO2 downto high 70s at most wtih poor wave pleth, reading 93% at end of session with pt up in chair   Exercises     Shoulder Instructions      Home Living Family/patient expects to be discharged to:: Private residence Living Arrangements: Alone Available Help at Discharge: Family;Available PRN/intermittently Type of Home: House Home Access: Ramped entrance     Home Layout: One level     Bathroom Shower/Tub: Other (comment) (remodeling to a walk in shower currently)   Bathroom Toilet: Standard     Home Equipment: Cane - quad;Shower seat;Hand held Programmer, systems (2 wheels)          Prior Functioning/Environment Prior Level of Function : Independent/Modified Independent             Mobility Comments: no AD PTA ADLs Comments: independent- reports sponge bath level; family for transportation, ind with med mgmt    OT Problem List: Decreased strength;Decreased range of motion;Decreased activity tolerance;Impaired balance (sitting and/or standing)   OT Treatment/Interventions: Self-care/ADL training;Therapeutic exercise;Energy conservation;DME and/or AE instruction;Therapeutic activities;Patient/family education;Balance training      OT Goals(Current goals can be found in the care plan section)   Acute  Rehab OT Goals Patient Stated Goal: none stated OT Goal Formulation: With patient Time For Goal Achievement: 11/09/23 Potential to Achieve Goals: Good ADL Goals Pt Will Perform Upper Body Dressing: with modified independence;sitting Pt Will Perform Lower Body Dressing: with modified independence;sitting/lateral leans;sit to/from stand Pt Will Transfer to Toilet: with modified independence;ambulating;regular height toilet Additional ADL Goal #1: pt will tolerate OOB activity x10 min in prep for ADLs   OT Frequency:  Min 1X/week    Co-evaluation              AM-PAC OT "6 Clicks" Daily Activity     Outcome Measure Help from another person eating meals?: A Little Help from another person taking care of personal grooming?: A Little Help from another person toileting, which includes using toliet, bedpan, or urinal?: A Lot Help from another person bathing (including washing, rinsing, drying)?: A Lot Help from another person to put on and taking off regular upper body clothing?: A Lot Help from another person to put on and taking off regular lower body clothing?: A Lot 6 Click Score: 14   End of Session Equipment Utilized During Treatment: Gait belt;Oxygen Nurse Communication: Mobility status  Activity Tolerance: Patient tolerated treatment well Patient left: in chair;with call bell/phone within reach;with nursing/sitter in room;with family/visitor present  OT Visit Diagnosis: Unsteadiness on feet (R26.81);Other abnormalities of gait and mobility (R26.89);Muscle weakness (generalized) (M62.81)  Time: 1610-9604 OT Time Calculation (min): 34 min Charges:  OT General Charges $OT Visit: 1 Visit OT Evaluation $OT Eval Moderate Complexity: 1 Mod OT Treatments $Therapeutic Activity: 8-22 mins  Carver Fila, OTD, OTR/L SecureChat Preferred Acute Rehab [phone]   Carver Fila Koonce 10/26/2023, 12:14 PM

## 2023-10-26 NOTE — Progress Notes (Signed)
 Speech Language Pathology Treatment: Dysphagia  Patient Details Name: Lauren Baker MRN: [id number] DOB: 1940-10-22 Today's Date: 10/26/2023 Time: 1035-1100 SLP Time Calculation (min) (ACUTE ONLY): 25 min  Assessment / Plan / Recommendation Clinical Impression  Pt was seen for diet check. Pt's son was present during session. Per son, pt has not been getting adequate nutritional intake over the past few days; pt has been having issues with solid foods. Pt's supplemental oxygen level was at 15 L/min.  Pt was observed with thin-liquids, pills, and a biscuit. There was single event of delayed coughing with thin-liquid, but no other signs of aspiration/dysphagia. During pill administration with nurse, pt tolerated whole-pill and thin-liquid with a single event of immediate coughing with a bigger pill. Verbal cueing was highly beneficial during pill administration to prepare pt to take pill. Pt exhibited prolonged mastication with biscuit and only ~70% oral clearance, requiring a liquid wash. SLP provided education to pt's son about the impact of limited nutritional intake on recovery and the potential need for temporary alternative means of nutrition. Pt's son has reservations about tube feeding; plan is for pt to have lunch, and family will determine further plans for nutritional intake based on lunch intake.   Recommend current diet with emphasis on softer foods (mac n' cheese) to improve pt's nutritional intake. Due to lower oxygen requirements, pt's swallowing might be easier. SLP will f/u to monitor pt's diet.    HPI HPI: Lauren Baker is an 83 year old woman who was admitted to [facility] with acute hypoxemic respiratory failure due to influenza A pneumonia requiring 13-15L of oxygen. Transfered to [facility] ICU requiring BiPAP, now transitioned to 45L HHFNC. CXR 3/15: "Stable cardiomediastinal contours. Small effusions appear similar to  previous exam. Unchanged appearance of bilateral interstitial  opacities,  scarring and architectural distortion." Pt with atrial fibrillation and hypertension      SLP Plan  Continue with current plan of care      Recommendations for follow up therapy are one component of a multi-disciplinary discharge planning process, led by the attending physician.  Recommendations may be updated based on patient status, additional functional criteria and insurance authorization.    Recommendations  Medication Administration: Whole meds with liquid Compensations: Slow rate;Small sips/bites                  Oral care BID     Dysphagia, oral phase (R13.11)     Continue with current plan of care     Lauren Baker  10/26/2023, 11:49 AM

## 2023-10-26 NOTE — Progress Notes (Signed)
 NAME:  Lauren Baker, MRN:  [id number], DOB:  1941-07-04, LOS: 6 ADMISSION DATE:  10/20/2023, CONSULTATION DATE:  10/22/23 REFERRING MD:  Nevin Bloodgood, MD CHIEF COMPLAINT:  Resp Failure/Flu A   History of Present Illness:  Lauren Baker is an 83 year old woman with atrial fibrillation and hypertension who is admitted to [facility] with acute hypoxemic respiratory failure due to influenza A pneumonia requiring 13-15L of oxygen.   She is being treated with oseltamavir and empiric antibiotics with cefepime. Vancomycin discontinued as MRSA screen was negative. She is receiving IV solumedrol 60mg  IV twice daily and budesonide, brovana and atrovent nebs. She was started on amiodarone for atrial fibrillation with RVR which is better rate controlled. She has been consistently on eliquis as outpatient and remains on it since being in the hospital.   Family reports she had second hand smoke from her husband for many years. Never smoked herself. Has otherwise been healthy and not been sick like this before. She has productive cough, some blood tinged secretions.   The patient had a recent hospital admission from 09/21/2023 to 09/24/2023 for acute respiratory failure. The patient was initially started on antibiotics, but these were discontinued. The patient was discharged with a prednisone taper for alveolitis. She was discharged on 4 L. She has been since weaned to 1.5 L. The patient had been doing fairly well up until 10/19/2023. She had been making progress with her PT and ambulating with a walker over 100 feet. She did develop some coughing and chest congestion. In the evening of 10/19/2023, the patient began having worsening shortness of breath. She was noted to be hypoxic with oxygen saturation in the 70s. She was transferred to the emergency department further evaluation and treatment.   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 Continued on BiPAP 3/15 Transition to high flow nasal cannula, Foley catheter placed for urinary retention 3/16 held diuresis 2/2 AKI. On diet  Interim History / Subjective:  Afebrile overnight with VSS. Seen at bedside. Son present. Doing better on HHFNC; however, poor po intake and difficulty with some PO medications. No increase in work of breathing. Mild confusion overnight with intermittent response during rounds. Son is concerned about foley catheter   Objective   Blood pressure 113/70, pulse (!) 114, temperature 98 F (36.7 C), temperature source Axillary, resp. rate 17, height 5\' 9"  (1.753 m), weight 82.9 kg, SpO2 100%.    FiO2 (%):  [60 %-64 %] 64 %   Intake/Output Summary (Last 24 hours) at 10/26/2023 0625 Last data filed at 10/26/2023 0400 Gross per 24 hour  Intake 1932.94 ml  Output 1655 ml  Net 277.94 ml   Filed Weights   10/21/23 2300 10/24/23 0500 10/25/23 0500  Weight: 82.3 kg 82.6 kg 82.9 kg   Examination: Gen:     No acute distress HEENT:  EOMI, sclera anicteric Neck:     No massess. Supple. Unable to assess JVP Lungs:    Ronchi bilaterally CV:       Tachycardia, irregular rhythm Abd:      Soft non tender, non distended Ext: Warm; no LE edema Neuro: Awake, nodding to questions intermittently  Pertinent labs Potassium 4.1 BUN/creatinine 36/0.95 Mg 2.2 Phosp 3.4 Hemoglobin 9.1, platelets 86  Pertinent imaging  Bladder scan: >952mL in bladder 3/15: interstitial lung opacities with RLL  Chest x-ray 3/15-stable small effusion, interstitial opacities CT 3/11 - Pulmonary edema/ R bronchus intermedius with mucoid materoa;  Resolved Hospital Problem list     Assessment & Plan:   Lauren Baker is a 83yo F with hx  of atrial fibrillation, HTN, and acute respiratory failure due to influenza admitted to ICU for same currently on heated high flow; suspect patient's acute illness exacerbated fluid status, now euvolemic, and driving Afib with RVR this hospitalization requiring rate control therapy  Acute hypoxemic respiratory failure Influenza pneumonia - On heated high flow; planning to wean to salter HF - Continue doxycycline, cefepime (3/11 - 3/17) - Finished course of Tamiflu - Continue Brovana, Pulmicort, Yupelri - Mucinex BID - Levalbuterol as needed - Prednisone taper  Permanent Atrial fibrillation with RVR - Goal <120 per Cardiology - Cardizem drip - Metoprolol will increase to 50mg  q8 p.o. - Will reassess this PM if to continue dilt drip - Telemetry monitoring - PTA Eliquis for anticoagulation  Acute on Chronic HFpEF HTN - CXR with pulmonary edema and bilateral chest effusions. BNP on admission 859 - 2/25 TTE with LVE 55-60%, indet diastolic function, normal RV function, severe BAE, mild MR  - Holding diuresis today; may resume PO lasix 40 tomorrow - Monitor I's/O - Monitor and replete electrolytes  AKI -Resolved  Urinary retention Bladder stretch injury - Continue Foley - Trial of bethanechol for 3 days - Plan to trial to void in 1-2 days  Anemia Thrombocytopenia Monitor. May need to stop eliquis if it drops further  Tachybrady syndrome - Has pacemanker  Depression -Bupropion TID -Duloxetine 30 mg daily -Clonazepam 0.5 mg BID -Quetapine  Hypothyroidism - Levorhyroxine 25 mcg daily  Nutrition Hyperglycemia SSI resistant scale - RD consult   Best Practice (right click and "Reselect all SmartList Selections" daily)   Diet/type: dysphagia diet (see orders) DVT prophylaxis: DOAC GI prophylaxis: PPI Lines: N/A Foley:  Yes, and it is still needed Code Status:  full code Last date of multidisciplinary goals of care discussion [--]  Critical care time:    Morene Crocker, MD [facility] Internal Medicine Program - PGY-2 10/26/2023, 6:25 AM

## 2023-10-26 NOTE — TOC Initial Note (Addendum)
 Transition of Care [facility]) - Initial/Assessment Note    Patient Details  Name: Lauren Baker MRN: [id number] Date of Birth: May 29, 1941  [facility]) CM/SW Contact:    Marliss Coots, LCSW Phone Number: 10/26/2023, 9:52 AM  Clinical Narrative:                   9:52 AM Per chart review, CIR is no longer following patient due to diagnosis payor will not approved for hospital rehabilitation/CIR. Patient has SNF history [facility]).  12:09 PM CSW introduced herself and role to patient at bedside. Therapy and son were also present at bedside. CSW followed up on SNF history with [facility]. Son confirmed patient is from [facility] and expressed interest in her returning. Patient did not oppose son's interest. CSW contacted Kerri with [facility].  Expected Discharge Plan: Skilled Nursing Facility Barriers to Discharge: Continued Medical Work up   Patient Goals and CMS Choice Patient states their goals for this hospitalization and ongoing recovery are:: go to SNF CMS Medicare.gov Compare Post Acute Care list provided to:: Patient Choice offered to / list presented to : Patient [place] ownership interest in [facility].provided to:: Patient    Expected Discharge Plan and Services In-house Referral: Clinical Social Work Discharge Planning Services: CM Consult Post Acute Care Choice: Skilled Nursing Facility Living arrangements for the past 2 months: Single Family Home                                      Prior Living Arrangements/Services Living arrangements for the past 2 months: Single Family Home Lives with:: Self Patient language and need for interpreter reviewed:: Yes Do you feel safe going back to the place where you live?: Yes      Need for Family Participation in Patient Care: Yes (Comment) Care giver support system in place?: Yes (comment) Current home services: DME Criminal Activity/Legal  Involvement Pertinent to Current Situation/Hospitalization: No - Comment as needed  Activities of Daily Living   ADL Screening (condition at time of admission) Independently performs ADLs?: No Does the patient have a NEW difficulty with bathing/dressing/toileting/self-feeding that is expected to last >3 days?: No Does the patient have a NEW difficulty with getting in/out of bed, walking, or climbing stairs that is expected to last >3 days?: No Does the patient have a NEW difficulty with communication that is expected to last >3 days?: No Is the patient deaf or have difficulty hearing?: No Does the patient have difficulty seeing, even when wearing glasses/contacts?: No Does the patient have difficulty concentrating, remembering, or making decisions?: Yes  Permission Sought/Granted Permission sought to share information with : Family Supports Permission granted to share information with : No (Contact information on chart)  Share Information with NAME: Gilman Buttner     Permission granted to share info w Relationship: Daughter  Permission granted to share info w Contact Information: [phone]  Emotional Assessment Appearance:: Appears stated age Attitude/Demeanor/Rapport: Engaged Affect (typically observed): Accepting Orientation: : Oriented to Self, Oriented to Place, Oriented to  Time, Oriented to Situation Alcohol / Substance Use: Not Applicable Psych Involvement: No (comment)  Admission diagnosis:  Acute respiratory failure with hypoxia (HCC) [J96.01] Patient Active Problem List   Diagnosis Date Noted   Acute respiratory failure with hypoxia (HCC) 10/20/2023   Influenza A with pneumonia 10/20/2023   Pancytopenia (HCC) 10/20/2023  Chronic kidney disease, stage 3b (HCC) 10/20/2023   Unspecified protein-calorie malnutrition (HCC) 09/30/2023   Acquired hypothyroidism 09/29/2023   Depression, major, recurrent, severe with psychosis (HCC) 09/29/2023   Aortic atherosclerosis  (HCC) 09/29/2023   Gastroesophageal reflux disease 09/24/2023   Respiratory failure with hypoxia (HCC) 09/21/2023   Pacemaker 03/04/2022   Chronic atrial fibrillation (HCC) 03/12/2018   Primary hypertension 03/12/2018   PCP:  Elfredia Nevins, MD Pharmacy:   [facility] - [place], [place] - [address] [place] [address] Phone: [phone] Fax: [phone]     Social Drivers of Health (SDOH) Social History: SDOH Screenings   Food Insecurity: No Food Insecurity (10/20/2023)  Housing: Low Risk  (10/20/2023)  Transportation Needs: No Transportation Needs (10/20/2023)  Utilities: Not At Risk (10/20/2023)  Social Connections: Socially Isolated (10/20/2023)  Tobacco Use: Low Risk  (10/20/2023)   SDOH Interventions:     Readmission Risk Interventions    10/21/2023   10:15 AM 09/23/2023    1:12 PM  Readmission Risk Prevention Plan  Medication Screening  Complete  Transportation Screening Complete Complete  Home Care Screening Complete   Medication Review (RN CM) Complete

## 2023-10-26 NOTE — Progress Notes (Signed)
 Inpatient Rehab Admissions Coordinator:   Per therapy recommendations, patient was screened for CIR candidacy by Megan Salon, MS, CCC-SLP. At this time, Pt. Does not have a diagnosis that I think payor will approve for in hospital rehabilitation/CIR. will not pursue a rehab consult for this Pt.   Recommend other rehab venues to be pursued.  Please contact me with any questions.  Megan Salon, MS, CCC-SLP Rehab Admissions Coordinator  [phone] (celll) [phone] (office)

## 2023-10-27 ENCOUNTER — Inpatient Hospital Stay (HOSPITAL_COMMUNITY)

## 2023-10-27 DIAGNOSIS — J09X2 Influenza due to identified novel influenza A virus with other respiratory manifestations: Secondary | ICD-10-CM

## 2023-10-27 DIAGNOSIS — J80 Acute respiratory distress syndrome: Secondary | ICD-10-CM | POA: Diagnosis not present

## 2023-10-27 DIAGNOSIS — I4891 Unspecified atrial fibrillation: Secondary | ICD-10-CM | POA: Diagnosis not present

## 2023-10-27 DIAGNOSIS — I482 Chronic atrial fibrillation, unspecified: Secondary | ICD-10-CM | POA: Diagnosis not present

## 2023-10-27 DIAGNOSIS — Z950 Presence of cardiac pacemaker: Secondary | ICD-10-CM | POA: Diagnosis not present

## 2023-10-27 LAB — CBC
HCT: 31.7 % — ABNORMAL LOW (ref 36.0–46.0)
Hemoglobin: 10.6 g/dL — ABNORMAL LOW (ref 12.0–15.0)
MCH: 31.3 pg (ref 26.0–34.0)
MCHC: 33.4 g/dL (ref 30.0–36.0)
MCV: 93.5 fL (ref 80.0–100.0)
Platelets: 126 10*3/uL — ABNORMAL LOW (ref 150–400)
RBC: 3.39 MIL/uL — ABNORMAL LOW (ref 3.87–5.11)
RDW: 15.4 % (ref 11.5–15.5)
WBC: 8.3 10*3/uL (ref 4.0–10.5)
nRBC: 0 % (ref 0.0–0.2)

## 2023-10-27 LAB — ANCA PROFILE
Anti-MPO Antibodies: <0.2 U (ref 0.0–0.9)
Anti-PR3 Antibodies: <0.2 U (ref 0.0–0.9)
Atypical P-ANCA titer: <1:20 {titer}
C-ANCA: <1:20 {titer}
P-ANCA: <1:20 {titer}

## 2023-10-27 LAB — GLUCOSE, CAPILLARY
Glucose-Capillary: 175 mg/dL — ABNORMAL HIGH (ref 70–99)
Glucose-Capillary: 70 mg/dL (ref 70–99)
Glucose-Capillary: 96 mg/dL (ref 70–99)
Glucose-Capillary: 144 mg/dL — ABNORMAL HIGH (ref 70–99)
Glucose-Capillary: 158 mg/dL — ABNORMAL HIGH (ref 70–99)
Glucose-Capillary: 205 mg/dL — ABNORMAL HIGH (ref 70–99)

## 2023-10-27 LAB — PHOSPHORUS: Phosphorus: 2.9 mg/dL (ref 2.5–4.6)

## 2023-10-27 LAB — BASIC METABOLIC PANEL
Anion gap: 8 (ref 5–15)
BUN: 42 mg/dL — ABNORMAL HIGH (ref 8–23)
CO2: 28 mmol/L (ref 22–32)
Calcium: 9.3 mg/dL (ref 8.9–10.3)
Chloride: 96 mmol/L — ABNORMAL LOW (ref 98–111)
Creatinine, Ser: 0.79 mg/dL (ref 0.44–1.00)
GFR, Estimated: >60 mL/min (ref >60)
Glucose, Bld: 284 mg/dL — ABNORMAL HIGH (ref 70–99)
Potassium: 4.1 mmol/L (ref 3.5–5.1)
Sodium: 132 mmol/L — ABNORMAL LOW (ref 135–145)

## 2023-10-27 LAB — MAGNESIUM: Magnesium: 2 mg/dL (ref 1.7–2.4)

## 2023-10-27 MED ORDER — METHYLPREDNISOLONE SODIUM SUCC 40 MG IJ SOLR: 20.0000 mg | Freq: Every day | INTRAMUSCULAR | Status: DC

## 2023-10-27 MED ORDER — INSULIN ASPART 100 UNIT/ML IJ SOLN: 0.0000 [IU] | Freq: Every day | INTRAMUSCULAR | Status: DC

## 2023-10-27 MED ORDER — METHYLPREDNISOLONE SODIUM SUCC 40 MG IJ SOLR
40.0000 mg | Freq: Every day | INTRAMUSCULAR | Status: DC
  Administered 2023-10-27 – 2023-10-28 (×2): 40 mg via INTRAVENOUS
  Filled 2023-10-27 (×2): qty 1

## 2023-10-27 MED ORDER — INSULIN ASPART 100 UNIT/ML IJ SOLN: 0.0000 [IU] | Freq: Three times a day (TID) | INTRAMUSCULAR | Status: DC

## 2023-10-27 MED ORDER — ENOXAPARIN SODIUM 80 MG/0.8ML IJ SOSY
80.0000 mg | PREFILLED_SYRINGE | Freq: Two times a day (BID) | INTRAMUSCULAR | Status: DC
  Administered 2023-10-27 – 2023-10-28 (×2): 80 mg via SUBCUTANEOUS
  Filled 2023-10-27 (×3): qty 0.8

## 2023-10-27 MED ORDER — CARMEX CLASSIC LIP BALM EX OINT
TOPICAL_OINTMENT | CUTANEOUS | Status: DC | PRN
  Filled 2023-10-27: qty 10

## 2023-10-27 MED ORDER — FUROSEMIDE 40 MG PO TABS
20.0000 mg | ORAL_TABLET | Freq: Every day | ORAL | Status: DC
  Filled 2023-10-27: qty 1

## 2023-10-27 MED ORDER — METOPROLOL TARTRATE 5 MG/5ML IV SOLN
10.0000 mg | Freq: Four times a day (QID) | INTRAVENOUS | Status: DC
  Administered 2023-10-27 – 2023-10-28 (×2): 10 mg via INTRAVENOUS
  Filled 2023-10-27 (×2): qty 10

## 2023-10-27 MED ORDER — LORAZEPAM 2 MG/ML IJ SOLN: 1.0000 mg | Freq: Three times a day (TID) | INTRAMUSCULAR | Status: DC | PRN

## 2023-10-27 MED ORDER — FUROSEMIDE 10 MG/ML IJ SOLN
40.0000 mg | Freq: Once | INTRAMUSCULAR | Status: AC
  Administered 2023-10-27: 40 mg via INTRAVENOUS
  Filled 2023-10-27: qty 4

## 2023-10-27 MED ORDER — INSULIN ASPART 100 UNIT/ML IJ SOLN
0.0000 [IU] | INTRAMUSCULAR | Status: DC
  Administered 2023-10-27: 3 [IU] via SUBCUTANEOUS
  Administered 2023-10-27: 1 [IU] via SUBCUTANEOUS
  Administered 2023-10-27: 2 [IU] via SUBCUTANEOUS
  Administered 2023-10-28: 1 [IU] via SUBCUTANEOUS
  Administered 2023-10-28: 2 [IU] via SUBCUTANEOUS

## 2023-10-27 MED ORDER — FUROSEMIDE 10 MG/ML IJ SOLN
20.0000 mg | Freq: Once | INTRAMUSCULAR | Status: DC
  Filled 2023-10-27: qty 2

## 2023-10-27 MED ORDER — OLANZAPINE 10 MG IM SOLR
5.0000 mg | Freq: Once | INTRAMUSCULAR | Status: DC
  Filled 2023-10-27 (×2): qty 10

## 2023-10-27 MED ORDER — DIAZEPAM 5 MG/ML IJ SOLN: 2.5000 mg | Freq: Three times a day (TID) | INTRAMUSCULAR | Status: DC | PRN

## 2023-10-27 MED ORDER — PANTOPRAZOLE SODIUM 40 MG IV SOLR
40.0000 mg | Freq: Every day | INTRAVENOUS | Status: DC
  Administered 2023-10-27: 40 mg via INTRAVENOUS
  Filled 2023-10-27: qty 10

## 2023-10-27 NOTE — Progress Notes (Signed)
 Patient Name: Lauren Baker Date of Encounter: 10/27/2023 [facility] Cardiologist: Dina Rich, MD   Interval Summary  .    From a respiratory standpoint patient looks unstable, worked with PT and desatted into the high 70s.  On nonrebreather now and high flow nasal cannula 45 L.  Vital Signs .    Vitals:   10/27/23 0630 10/27/23 0716 10/27/23 0730 10/27/23 0800  BP:   115/78 (!) 143/90  Pulse: (!) 126  (!) 115 (!) 116  Resp: (!) 28  (!) 22 (!) 30  Temp:  97.9 F (36.6 C)    TempSrc:  Oral    SpO2: 98%  94% 95%  Weight:      Height:        Intake/Output Summary (Last 24 hours) at 10/27/2023 0834 Last data filed at 10/27/2023 0800 Gross per 24 hour  Intake 533 ml  Output 1300 ml  Net -767 ml      10/25/2023    5:00 AM 10/24/2023    5:00 AM 10/21/2023   11:00 PM  Last 3 Weights  Weight (lbs) 182 lb 12.2 oz 182 lb 1.6 oz 181 lb 7 oz  Weight (kg) 82.9 kg 82.6 kg 82.3 kg      Telemetry/ECG    Atrial fibrillation heart rates 120-140- Personally Reviewed  CV Studies    Echocardiogram 09/22/2023  1. Left ventricular ejection fraction, by estimation, is 55 to 60%. The  left ventricle has normal function. Left ventricular endocardial border  not optimally defined to evaluate regional wall motion. Indeterminate  diastolic filling due to E-A fusion.   2. Right ventricular systolic function is normal. The right ventricular  size is mildly enlarged.   3. Left atrial size was severely dilated.   4. Right atrial size was severely dilated.   5. The mitral valve is normal in structure. Mild mitral valve  regurgitation. No evidence of mitral stenosis.   6. The aortic valve is tricuspid. Aortic valve regurgitation is trivial.  No aortic stenosis is present.   Comparison(s): No significant change from prior study.    Physical Exam .   GEN: Ill-appearing, tachypneic, accessory muscle breathing, on heated high flow Neck: No JVD Cardiac: Irregularly  irregular, tachycardic Respiratory: Very rhonchorous, wheezy breath sounds GI: Soft, nontender, non-distended  MS: Puffy extremities but no pitting edema  Patient Profile    Lauren Baker is a 83 y.o. female has hx of  permanent atrial fibrillation complicated by tachy-brady syndrome (s/p Medtronic PPM in 10/2018), HTN and hypothyroidism. Currently here for Afib RVR in the setting of flu with respiratory failure.   Assessment & Plan .     Permanent A-fib RVR Tachybradycardia syndrome status post Medtronic PPM In the setting of severe influenza A, hypoxic respiratory failure.  Was on significant rate control regiment initially with IV Lopressor, IV Cardizem, IV amiodarone.  Some of this has been weaned off however continues to have rates generally in the 120s (which reasonable), can spike to 140s.  In the setting of her acute illnesses not likely going to achieve perfect rate control and correcting underlying cause will be primary goal. Eliquis 5 mg twice daily Currently on IV diltiazem 15, Lopressor was increased from 50mg  to 75 mg every 8. Titrate as needed. BP tolerating now but be careful, wouldn't bottom this out to achieve perfect rate control.  Can consider IV amiodarone if blood pressure becomes an issue although again this is only for rate control. TSH normal.  Echo with preserved biventricular function, mildly volume up. Agree with giving IV Lasix 40 mg, give has needed.   Acute hypoxic respiratory failure secondary to influenza A Looks unstable right now, primary team deferring intubation at this time.  Very rhonchorous sounding lungs.  On high flow heated nasal cannula and nonrebreather.  She would be amenable to intubation, CODE STATUS has been discussed with patient.  Further management per primary team    For questions or updates, please contact [facility] Please consult [email or web address] for contact info under        Signed, Abagail Kitchens, PA-C

## 2023-10-27 NOTE — Progress Notes (Signed)
  This MD met with Donzetta Sprung Ryer's family members: Harold Barban, Larence Penning, and their spouses, some in person and some via phone. Day RN Elveria Rising was present during this conversation.  We discussed EMMALEA TREANOR 's hospital course in detail and the rapid respiratory decline since this AM. Currently she is on heated high flow currently and showing signs of fatigue and air hunger despite a 100% saturation on 60L/min. She is also had aspiration episodes while receiving food/pills in her room, and now is NPO (waiting SLP evaluation).  Family shared with me their concern about potential code and her changes of having a meaningful functional recovery if she does survive it. They were all in agreement that Ms. Aldama should not be intubated.   Of importance, family does not want patient to have a Cortrak/feeding tube, as this goes against her wishes. They are understanding that if she does not improve enough by tomorrow and she cannot take in calories by mouth tomorrow, the likely decision would be to transition Ms. Faraci to comfort care. But for now, they would like some time to talk with family and spend some time with her.   Plan: - Code status change from FULL code to DNR/DNI - Family will be present in room in next 24 hrs; considering comfort care if no significant recovery - No Cortrak / OG/NG tube for now - If patient's status further declines, family has appointed eldest brother Glory Rosebush as the person to be contacted. Phone number: [phone]. If he does not answer, his wife, Gretchen's number is [phone].  This discussion was discussed with ICU attending, Durel Salts, MD  Morene Crocker, MD 10/27/23 3:19 PM

## 2023-10-27 NOTE — Progress Notes (Signed)
 SLP Cancellation Note  Patient Details Name: Lauren Baker MRN: [id number] DOB: [birth year]   Cancelled treatment:       Reason Eval/Treat Not Completed: Orders received to reassess pt's swallowing given increasing supplemental O2 requirements. Will proceed with an instrumental swallow study (likely FEES since pt cannot travel to [facility]) subsequent date as pt is able to participate.    Gwynneth Aliment, M.A., CF-SLP Speech Language Pathology, Acute Rehabilitation Services  Secure Chat preferred [phone]   10/27/2023, 12:39 PM

## 2023-10-27 NOTE — Progress Notes (Signed)
 eLink Physician-Brief Progress Note Patient Name: Lauren Baker DOB: 03-16-1941 MRN: [id number]   Date of Service  10/27/2023  HPI/Events of Note  Persistent atrial fibrillation on Cardizem at 15.  He is n.p.o. and is scheduled for metoprolol IV push.  Unfortunately, borderline blood pressures  Also quite agitated and tachypneic with increased work of breathing  eICU Interventions  For now, repositioning seems to have helped with the agitation and work of breathing.  Would like to avoid continuous sedation-can utilize olanzapine once if the patient becomes more agitated.  Note the DNR  Blood pressure parameters added to the metoprolol.  Will try to wean a little bit on the diltiazem to accommodate IV pushes of metoprolol     Intervention Category Intermediate Interventions: Arrhythmia - evaluation and management  [name] 10/27/2023, 10:24 PM

## 2023-10-27 NOTE — Progress Notes (Signed)
 Physical Therapy Treatment Patient Details Name: Lauren Baker MRN: [id number] DOB: 1940-11-27 Today's Date: 10/27/2023   History of Present Illness 83 y.o. female admitted 10/20/23 with SOB, hypoxia, Afib with RVR and acute hypoxemic respiratory failure due to flu A, PNA. PMhx: 09/21/2023 -09/24/2023 admission for acute respiratory failure. Afib, HTN, PPM, depression    PT Comments  Pt with eyes open with noted LUE IV dislodge and secretions on gown on arrival. Pt able to state name and willingness to attempt with RN aware of IV. Pt with HR 113-130 at rest and able to participate with lifting extremities to change gown and fully clear legs from bed. Attempt at sitting EOB with pt only able to achieve partial sitting with significant assist limited by desaturation and return to bed. With [facility] elevated pt with continued oral secretions and provided suction with need for NRB and HHFNC, RT present. Session limited by cardiopulmonary tolerance. Will continue to follow as medically stable.  HHFNC 30L , 85% FiO2 initially with RT present and changed to 100% with attempt at activity and NRB with return to bed RR 25-35      If plan is discharge home, recommend the following: A lot of help with bathing/dressing/bathroom;Assistance with cooking/housework;Assist for transportation;Two people to help with walking and/or transfers;Direct supervision/assist for medications management   Can travel by private vehicle     No  Equipment Recommendations  BSC/3in1    Recommendations for Other Services       Precautions / Restrictions Precautions Precautions: Fall;Other (comment) Recall of Precautions/Restrictions: Impaired Precaution/Restrictions Comments: watch SpO2 (on HHFNC) & HR     Mobility  Bed Mobility Overal bed mobility: Needs Assistance Bed Mobility: Supine to Sit, Sit to Supine     Supine to sit: Mod assist     General bed mobility comments: gestural, verbal and tactile cues to bring  legs toward EOB. Pt able to bring legs over with min assist, mod assist to elevate trunk and pt with flexed trunk and posture unable to tolerate increased pivot to EOB or trunk extension with desaturation to 80% on 100% FiO2 at 30L with RT present. Returned to supine. Total +2 to slide toward HOB, HOB elevated and cues for breathing with RT present to apply NRB in addition with return to 95% after grossly    Transfers                   General transfer comment: unable due to pulmonary function    Ambulation/Gait                   Stairs             Wheelchair Mobility     Tilt Bed    Modified Rankin (Stroke Patients Only)       Balance Overall balance assessment: Needs assistance   Sitting balance-Leahy Scale: Zero                                      Communication Communication Communication: No apparent difficulties  Cognition Arousal: Alert Behavior During Therapy: Flat affect   PT - Cognitive impairments: No family/caregiver present to determine baseline, Orientation, Awareness, Memory                       PT - Cognition Comments: pt oriented to name, birth year and current month. pt otherwise having  trouble with attention, orientation and awareness. Following single step commands with delay Following commands: Impaired Following commands impaired: Follows one step commands with increased time    Cueing Cueing Techniques: Verbal cues, Tactile cues, Gestural cues  Exercises      General Comments        Pertinent Vitals/Pain Pain Assessment Pain Assessment: CPOT Facial Expression: Relaxed, neutral Body Movements: Absence of movements Muscle Tension: Relaxed Compliance with ventilator (intubated pts.): N/A Vocalization (extubated pts.): Talking in normal tone or no sound CPOT Total: 0 Pain Intervention(s): Monitored during session, Repositioned    Home Living                          Prior  Function            PT Goals (current goals can now be found in the care plan section) Progress towards PT goals: Not progressing toward goals - comment    Frequency    Min 2X/week      PT Plan      Co-evaluation              AM-PAC PT "6 Clicks" Mobility   Outcome Measure  Help needed turning from your back to your side while in a flat bed without using bedrails?: A Lot Help needed moving from lying on your back to sitting on the side of a flat bed without using bedrails?: A Lot Help needed moving to and from a bed to a chair (including a wheelchair)?: Total Help needed standing up from a chair using your arms (e.g., wheelchair or bedside chair)?: Total Help needed to walk in hospital room?: Total Help needed climbing 3-5 steps with a railing? : Total 6 Click Score: 8    End of Session Equipment Utilized During Treatment: Oxygen Activity Tolerance: Treatment limited secondary to medical complications (Comment);Patient limited by lethargy Patient left: in bed;with call bell/phone within reach;with bed alarm set Nurse Communication: Mobility status;Other (comment) (tolerance) PT Visit Diagnosis: Unsteadiness on feet (R26.81);Other abnormalities of gait and mobility (R26.89);Muscle weakness (generalized) (M62.81);Difficulty in walking, not elsewhere classified (R26.2)     Time: 1610-9604 PT Time Calculation (min) (ACUTE ONLY): 22 min  Charges:    $Therapeutic Activity: 8-22 mins PT General Charges $$ ACUTE PT VISIT: 1 Visit                     Lauren Baker, PT Acute Rehabilitation Services Office: [phone]    Lauren Baker 10/27/2023, 10:14 AM

## 2023-10-27 NOTE — Progress Notes (Signed)
 Calorie Count Note  48-hour calorie count ordered.  Reviewed tickets and pt meeting ~50% of her needs over the last 24 hours. The majority of this is coming from supplements. Pt with significant WOB and desaturation this AM. Pt now NPO and SLP planning for FEES in the AM.   Discussed in rounds, order placed for cortrak.  Diet: DYS 3, thin Supplements: Ensure Enlive TID, Might Shake TID, and Magic Cup TID  Estimated Nutritional Needs:  Kcal:  1500-1700 kcal/d Protein:  75-90g/d Fluid:  1.5-1.7L/d  3/18 Lunch: 160 kcal, 10g protein 3/18 Dinner: 694kcal, 20g protein (most nutrition coming from supplements) 3/19 Breakfast: 55 kcal, 1 g protein  Total intake: 909 kcal (61% of minimum estimated needs)  31 protein (41% of minimum estimated needs)  NUTRITION DIAGNOSIS:  Inadequate oral intake related to poor appetite (WOB) as evidenced by meal completion < 50%, per patient/family report.   GOAL:  Patient will meet greater than or equal to 90% of their needs  INTERVENTION:  Continue current diet as ordered per SLP Ordering assistance Ensure Enlive po TID, each supplement provides 350 kcal and 20 grams of protein. Mighty Shake TID with meals, each supplement provides 330 kcals and 9 grams of protein Magic cup TID with meals, each supplement provides 290 kcal and 9 grams of protein Continue Kcal count x 24 more hours If intake remains inadequate to support needs, consider cortrak placement on Wednesday If enteral nutrition needed, recommend the following Osmolite 1.2 @ 54mL/h (1249mL/d) Start at 20 and advance by 10mL q8h to goal  Prosource TF20 1x/d At goal, this will provide 1520kcal, 87g protein, free water   Greig Castilla, RD, LDN Registered Dietitian II Please reach out via secure chat Weekend on-call pager # available in [facility]

## 2023-10-27 NOTE — Progress Notes (Signed)
 RT at bedside for sched neb treatment,  when RT arrived pt with PT. Pt had increased WOB, RR> 30, SPO2 79-82 while on 30L 85% HHFNC.  RT administered neb treatments via aerogen, and increased HHFNC to 100% and 50L, SpO2 84%. PT was assisting pt in bed, pt remained very dyspneic despite increasing setting of HHFNC. RT placed NRB over HHFNC and pt sats increased to 88-92%. RN at bedside, CCM aware, RT will monitor as needed.     10/27/23 6644  Therapy Vitals  Pulse Rate (!) 117  Resp (!) 26  MEWS Score/Color  MEWS Score 4  MEWS Score Color Red  Respiratory Assessment  Assessment Type Assess only  Respiratory Pattern Regular;Unlabored  Chest Assessment Chest expansion symmetrical  Cough Productive  Bilateral Breath Sounds Diminished  Oxygen Therapy/Pulse Ox  O2 Device HHFNC  O2 Therapy Oxygen humidified  Heater temperature (S)  87.8 F (31 C) (due to pt fever and comfort)  O2 Flow Rate (L/min) (S)  50 L/min (pt desat)  FiO2 (%) (S)  100 %  SpO2 (!) 89 %

## 2023-10-27 NOTE — TOC Progression Note (Addendum)
 Transition of Care [facility]) - Progression Note    Patient Details  Name: Lauren Baker MRN: [id number] Date of Birth: [birth year]  [facility]) CM/SW Contact  Marliss Coots, LCSW Phone Number: 10/27/2023, 9:13 AM  Clinical Narrative:     9:13 AM Per [facility] admissions, referral is under review.  11:43 AM Per progressions, patient is expected to receive cortrak tomorrow. [facility] informed CSW that bed offer is pending medical improvement of patient.  4:45 PM Per chart review, patient and family are not agreeable to cortrak. Patient may transition to comfort care tomorrow.  Expected Discharge Plan: Skilled Nursing Facility Barriers to Discharge: Continued Medical Work up  Expected Discharge Plan and Services In-house Referral: Clinical Social Work Discharge Planning Services: CM Consult Post Acute Care Choice: Skilled Nursing Facility Living arrangements for the past 2 months: Single Family Home                                       Social Determinants of Health (SDOH) Interventions SDOH Screenings   Food Insecurity: No Food Insecurity (10/20/2023)  Housing: Low Risk  (10/20/2023)  Transportation Needs: No Transportation Needs (10/20/2023)  Utilities: Not At Risk (10/20/2023)  Social Connections: Socially Isolated (10/20/2023)  Tobacco Use: Low Risk  (10/20/2023)    Readmission Risk Interventions    10/21/2023   10:15 AM 09/23/2023    1:12 PM  Readmission Risk Prevention Plan  Medication Screening  Complete  Transportation Screening Complete Complete  Home Care Screening Complete   Medication Review (RN CM) Complete

## 2023-10-27 NOTE — Progress Notes (Signed)
 NAME:  Lauren Baker, MRN:  [id number], DOB:  [birth year], LOS: 7 ADMISSION DATE:  10/20/2023, CONSULTATION DATE:  10/22/23 REFERRING MD:  Nevin Bloodgood, MD CHIEF COMPLAINT:  Resp Failure/Flu A   History of Present Illness:  Lauren Baker is an 83 year old woman with atrial fibrillation and hypertension who is admitted to [facility] with acute hypoxemic respiratory failure due to influenza A pneumonia requiring 13-15L of oxygen.   She is being treated with oseltamavir and empiric antibiotics with cefepime. Vancomycin discontinued as MRSA screen was negative. She is receiving IV solumedrol 60mg  IV twice daily and budesonide, brovana and atrovent nebs. She was started on amiodarone for atrial fibrillation with RVR which is better rate controlled. She has been consistently on eliquis as outpatient and remains on it since being in the hospital.   Family reports she had second hand smoke from her husband for many years. Never smoked herself. Has otherwise been healthy and not been sick like this before. She has productive cough, some blood tinged secretions.   The patient had a recent hospital admission from 09/21/2023 to 09/24/2023 for acute respiratory failure. The patient was initially started on antibiotics, but these were discontinued. The patient was discharged with a prednisone taper for alveolitis. She was discharged on 4 L. She has been since weaned to 1.5 L. The patient had been doing fairly well up until 10/19/2023. She had been making progress with her PT and ambulating with a walker over 100 feet. She did develop some coughing and chest congestion. In the evening of 10/19/2023, the patient began having worsening shortness of breath. She was noted to be hypoxic with oxygen saturation in the 70s. She was transferred to the emergency department further evaluation and treatment.   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 Continued on BiPAP 3/15 Transition to high flow nasal cannula, Foley catheter placed for urinary retention 3/16 held diuresis 2/2 AKI. On diet  Interim History / Subjective:  Overnight: increased metop 75mg  q8. Rates 113-123  and MAP >65 this AM.  HHFNC - FiO2 increased to 60->87% this AM, 61ml/HR rate. However, increased work of breathing while moving in bed this morning. Temporarily used NRB mask for support.    Objective   Blood pressure (!) 128/98, pulse (!) 128, temperature 98.4 F (36.9 C), temperature source Axillary, resp. rate (!) 28, height 5\' 9"  (1.753 m), weight 82.9 kg, SpO2 94%.    FiO2 (%):  [40 %-87 %] 87 %   Intake/Output Summary (Last 24 hours) at 10/27/2023 0621 Last data filed at 10/27/2023 0500 Gross per 24 hour  Intake 572.57 ml  Output 1300 ml  Net -727.43 ml   Filed Weights   10/21/23 2300 10/24/23 0500 10/25/23 0500  Weight: 82.3 kg 82.6 kg 82.9 kg   Examination: Gen:     Mild distress HEENT:  EOMI, sclera anicteric Neck:     No massess. Supple.  Lungs:    Rhonchi throughout; crackles in bilateral bases CV:         Tachycardia, irregular Abd:      Soft non tender, non distended Ext: Warm; no LE edema Neuro:  Awake and responding yes and no answers  Pertinent labs  Glucose: 284 Na 132<135 K 4.1 BUN/Cr 42/0.79 Mg  2 Phos 2.9  Hgb 10.6 - Plat 126 <102   New imaging: 3/18 CXR: increased diffused  alveolar opacities   Pertinent imaging 3/15: interstitial lung opacities with RLL  Chest x-ray 3/15-stable small effusion, interstitial opacities CT 3/11 - Pulmonary edema/ R bronchus intermedius with mucoid materoa;  Resolved Hospital Problem list     Assessment & Plan:   Lauren Baker is a 83yo F  with hx of atrial fibrillation, HTN, and acute respiratory failure due to influenza admitted to ICU for same currently on heated high flow; suspect patient's acute illness exacerbated fluid status. Today, patient shows signs of pulmonary edema and volume overload after lasix holiday. She continues to be in RVR with rates ~120  Acute hypoxemic respiratory failure Influenza pneumonia - On heated high flow - S/p 40 mg IV lasix, will reassess output and respiratory status - Completed course doxycycline, cefepime (3/11 - 3/17) - Finished course of Tamiflu - Continue Brovana, Pulmicort, Yupelri - Mucinex BID - Levalbuterol as needed - Prednisone taper  Permanent Atrial fibrillation with RVR - Goal <120 per Cardiology - Cardizem drip - Metoprolol will increase to75 mg q8 p.o. - May need Amiodarone addition - Appreciate cardiology recommendations - Telemetry monitoring - PTA Eliquis for anticoagulation  Acute on Chronic HFpEF HTN - CXR with pulmonary edema and bilateral chest effusions. BNP on admission 859 - 2/25 TTE with LVE 55-60%, indet diastolic function, normal RV function, severe BAE, mild MR  - IV lasix 40 mg today - Monitor I's/O - Monitor and replete electrolytes  AKI -Resolved  Urinary retention Bladder stretch injury - Continue Foley - Trial of bethanechol for 3 days - Plan to trial to void in 1-2 days  Anemia Thrombocytopenia Monitor. May need to stop eliquis if it drops further  Tachybrady syndrome - Has pacemanker  Depression -Bupropion TID -Duloxetine 30 mg daily -Clonazepam 0.5 mg BID -Quetapine  Hypothyroidism - Levorhyroxine 25 mcg daily  Nutrition Hyperglycemia SSI resistant scale - Add Lantus 10 - RD consult   Best Practice (right click and "Reselect all SmartList Selections" daily)   Diet/type: dysphagia diet (see orders) DVT prophylaxis: DOAC GI prophylaxis: PPI Lines: N/A Foley:  Yes, and it is still needed Code Status:  full  code Last date of multidisciplinary goals of care discussion [--]  Critical care time:    Morene Crocker, MD [facility] - PGY-2 10/27/2023, 6:21 AM

## 2023-10-28 DIAGNOSIS — J09X2 Influenza due to identified novel influenza A virus with other respiratory manifestations: Secondary | ICD-10-CM | POA: Diagnosis not present

## 2023-10-28 DIAGNOSIS — J80 Acute respiratory distress syndrome: Secondary | ICD-10-CM | POA: Diagnosis not present

## 2023-10-28 DIAGNOSIS — I4891 Unspecified atrial fibrillation: Secondary | ICD-10-CM | POA: Diagnosis not present

## 2023-10-28 LAB — BASIC METABOLIC PANEL
Anion gap: 10 (ref 5–15)
BUN: 43 mg/dL — ABNORMAL HIGH (ref 8–23)
CO2: 31 mmol/L (ref 22–32)
Calcium: 9.3 mg/dL (ref 8.9–10.3)
Chloride: 96 mmol/L — ABNORMAL LOW (ref 98–111)
Creatinine, Ser: 0.88 mg/dL (ref 0.44–1.00)
GFR, Estimated: >60 mL/min (ref >60)
Glucose, Bld: 188 mg/dL — ABNORMAL HIGH (ref 70–99)
Potassium: 4.6 mmol/L (ref 3.5–5.1)
Sodium: 137 mmol/L (ref 135–145)

## 2023-10-28 LAB — CBC
HCT: 30.3 % — ABNORMAL LOW (ref 36.0–46.0)
Hemoglobin: 10 g/dL — ABNORMAL LOW (ref 12.0–15.0)
MCH: 31.7 pg (ref 26.0–34.0)
MCHC: 33 g/dL (ref 30.0–36.0)
MCV: 96.2 fL (ref 80.0–100.0)
Platelets: 110 10*3/uL — ABNORMAL LOW (ref 150–400)
RBC: 3.15 MIL/uL — ABNORMAL LOW (ref 3.87–5.11)
RDW: 15.7 % — ABNORMAL HIGH (ref 11.5–15.5)
WBC: 8.2 10*3/uL (ref 4.0–10.5)
nRBC: 0 % (ref 0.0–0.2)

## 2023-10-28 LAB — MAGNESIUM: Magnesium: 2 mg/dL (ref 1.7–2.4)

## 2023-10-28 LAB — GLUCOSE, CAPILLARY
Glucose-Capillary: 176 mg/dL — ABNORMAL HIGH (ref 70–99)
Glucose-Capillary: 150 mg/dL — ABNORMAL HIGH (ref 70–99)
Glucose-Capillary: 162 mg/dL — ABNORMAL HIGH (ref 70–99)

## 2023-10-28 LAB — PHOSPHORUS: Phosphorus: 3.6 mg/dL (ref 2.5–4.6)

## 2023-10-28 MED ORDER — POLYVINYL ALCOHOL 1.4 % OP SOLN: 1.0000 [drp] | Freq: Four times a day (QID) | OPHTHALMIC | Status: DC | PRN

## 2023-10-28 MED ORDER — GLYCOPYRROLATE 0.2 MG/ML IJ SOLN
0.2000 mg | INTRAMUSCULAR | Status: DC | PRN
  Filled 2023-10-28: qty 1

## 2023-10-28 MED ORDER — ONDANSETRON 4 MG PO TBDP: 4.0000 mg | ORAL_TABLET | Freq: Four times a day (QID) | ORAL | Status: DC | PRN

## 2023-10-28 MED ORDER — ONDANSETRON HCL 4 MG/2ML IJ SOLN
4.0000 mg | Freq: Four times a day (QID) | INTRAMUSCULAR | Status: DC | PRN
Start: 2023-10-28 — End: 2023-10-29

## 2023-10-28 MED ORDER — METOPROLOL TARTRATE 5 MG/5ML IV SOLN
15.0000 mg | Freq: Four times a day (QID) | INTRAVENOUS | Status: DC
  Administered 2023-10-28: 15 mg via INTRAVENOUS
  Filled 2023-10-28: qty 15

## 2023-10-28 MED ORDER — MORPHINE 100MG IN NS 100ML (1MG/ML) PREMIX INFUSION
0.0000 mg/h | INTRAVENOUS | Status: DC
  Administered 2023-10-28: 5 mg/h via INTRAVENOUS
  Filled 2023-10-28: qty 100

## 2023-10-28 MED ORDER — MIDAZOLAM HCL 2 MG/2ML IJ SOLN
2.0000 mg | INTRAMUSCULAR | Status: DC | PRN
  Administered 2023-10-28: 4 mg via INTRAVENOUS
  Filled 2023-10-28: qty 4

## 2023-10-28 MED ORDER — ACETAMINOPHEN 650 MG RE SUPP: 650.0000 mg | Freq: Four times a day (QID) | RECTAL | Status: DC | PRN

## 2023-10-28 MED ORDER — HALOPERIDOL LACTATE 5 MG/ML IJ SOLN
2.5000 mg | INTRAMUSCULAR | Status: DC | PRN
  Filled 2023-10-28: qty 1

## 2023-10-28 MED ORDER — ACETAMINOPHEN 325 MG PO TABS: 650.0000 mg | ORAL_TABLET | Freq: Four times a day (QID) | ORAL | Status: DC | PRN

## 2023-10-28 MED ORDER — GLYCOPYRROLATE 1 MG PO TABS: 1.0000 mg | ORAL_TABLET | ORAL | Status: DC | PRN

## 2023-10-28 MED ORDER — GLYCOPYRROLATE 0.2 MG/ML IJ SOLN
0.2000 mg | INTRAMUSCULAR | Status: DC | PRN
  Administered 2023-10-28: 0.2 mg via INTRAVENOUS

## 2023-10-28 MED ORDER — SODIUM CHLORIDE 0.9 % IV SOLN: INTRAVENOUS | Status: DC

## 2023-10-28 MED ORDER — MORPHINE BOLUS VIA INFUSION
5.0000 mg | INTRAVENOUS | Status: DC | PRN
  Administered 2023-10-28 (×3): 5 mg via INTRAVENOUS

## 2023-10-29 LAB — CYCLIC CITRUL PEPTIDE ANTIBODY, IGG/IGA: CCP Antibodies IgG/IgA: 8 U (ref 0–19)

## 2023-10-30 LAB — ANTINUCLEAR ANTIBODIES, IFA: ANA Ab, IFA: NEGATIVE

## 2023-11-09 ENCOUNTER — Encounter: Payer: Medicare HMO | Admitting: Cardiology

## 2023-11-10 NOTE — TOC Progression Note (Addendum)
 Transition of Care [facility]) - Progression Note    Patient Details  Name: Lauren Baker MRN: [id number] Date of Birth: Jun 17, 1941  [facility] CM/SW Contact  Marliss Coots, LCSW Phone Number: 10/10/2023, 1:49 PM  Clinical Narrative:     1:49 PM Per chart review, patient has transitioned to comfort care. CSW closed TOC Consult (SNF).   Expected Discharge Plan: Skilled Nursing Facility Barriers to Discharge: Continued Medical Work up  Expected Discharge Plan and Services In-house Referral: Clinical Social Work Discharge Planning Services: CM Consult Post Acute Care Choice: Skilled Nursing Facility Living arrangements for the past 2 months: Single Family Home                                       Social Determinants of Health (SDOH) Interventions SDOH Screenings   Food Insecurity: No Food Insecurity (10/20/2023)  Housing: Low Risk  (10/20/2023)  Transportation Needs: No Transportation Needs (10/20/2023)  Utilities: Not At Risk (10/20/2023)  Social Connections: Socially Isolated (10/20/2023)  Tobacco Use: Low Risk  (10/20/2023)    Readmission Risk Interventions    10/21/2023   10:15 AM 09/23/2023    1:12 PM  Readmission Risk Prevention Plan  Medication Screening  Complete  Transportation Screening Complete Complete  Home Care Screening Complete   Medication Review (RN CM) Complete

## 2023-11-10 NOTE — Progress Notes (Signed)
   10/17/2023 1345  Spiritual Encounters  Type of Visit Initial  Care provided to: Pt and family  Reason for visit End-of-life  OnCall Visit No   Chaplain responded to Page to be with Pt and her family during EOL. Chaplain provided spiritual care and comfort to Pt and 10+ members of her family who surrounded Pt. Upon request, Chaplain prayed with Pt and her family. Pt's and family's faith seems strong and all appear to be handling this trial with love and faith. No additional spiritual need at this time.   Chaplain Daron Offer

## 2023-11-10 NOTE — Death Summary Note (Signed)
 DEATH SUMMARY   Patient Details  Name: Lauren Baker MRN: [id number] DOB: 07/06/1941  Admission/Discharge Information   Admit Date:  Oct 31, 2023  Date of Death: Date of Death: 11/08/2023  Time of Death: Time of Death: November 12, 1639  Length of Stay: 8  Referring Physician: Elfredia Nevins, MD   Reason(s) for Hospitalization  Flu A Diagnoses  Preliminary cause of death: ARDS secondary to influenza A pneumonia  Secondary Diagnoses (including complications and co-morbidities):  Active Problems:   Chronic a-fib (HCC)   Primary hypertension   Acute respiratory failure with hypoxia (HCC)   Influenza A with pneumonia   Pancytopenia (HCC)   Chronic kidney disease, stage 3b [facility])   Brief Hospital Course (including significant findings, care, treatment, and services provided and events leading to death)  Lauren Baker was a 83 y.o. year old female with atrial fibrillation and hypertension who is admitted to [facility] with acute hypoxemic respiratory failure due to influenza A pneumonia requiring 13-15L of oxygen. The patient had a recent hospital admission from 09/21/2023 to 09/24/2023 for acute respiratory failure. The patient was initially started on antibiotics, but these were discontinued. The patient was discharged with a prednisone taper for alveolitis. She was discharged on 4 L. She has been since weaned to 1.5 L. The patient had been doing fairly well up until 10/19/2023. She had been making progress with her PT and ambulating with a walker over 100 feet. She did develop some coughing and chest congestion. In the evening of 10/19/2023, the patient began having worsening shortness of breath. She was noted to be hypoxic with oxygen saturation in the 70s. She was transferred to the emergency department further evaluation and treatment.    Admitted to ICU and maintain on HHFNC. Treated with steroids, tamiflu, empiric abx. Ongoing issues of AF with RVR. Failed to consistently improve over 8 days, had some  delirium as well. Family did not want aggressive measures such as intubation and did make her DNR. Transitioned to comfort measures on 11-08-23 and she passed away peacefully with family at bedside.     Pertinent Labs and Studies  Significant Diagnostic Studies DG CHEST PORT 1 VIEW Result Date: 10/27/2023 CLINICAL DATA:  Shortness of breath. EXAM: PORTABLE CHEST 1 VIEW COMPARISON:  Chest radiograph dated 10/24/2023. FINDINGS: Interval progression of bilateral pulmonary opacities concerning for worsening pneumonia. Small bilateral pleural effusions. No pneumothorax. Stable cardiac silhouette. Left pectoral pacemaker device. No acute osseous pathology. IMPRESSION: Interval progression of bilateral pulmonary opacities concerning for worsening pneumonia. Electronically Signed   By: Elgie Collard M.D.   On: 10/27/2023 11:13   DG Chest Port 1 View Result Date: 10/24/2023 CLINICAL DATA:  Respiratory failure. EXAM: PORTABLE CHEST 1 VIEW COMPARISON:  10/23/2023 FINDINGS: Left chest wall pacer with lead in the right ventricle, unchanged. Stable cardiomediastinal contours. Small effusions appear similar to previous exam. Unchanged appearance of bilateral interstitial opacities, scarring and architectural distortion. IMPRESSION: 1. No significant change in the appearance of the chest. 2. Small effusions and bilateral interstitial opacities. Electronically Signed   By: Signa Kell M.D.   On: 10/24/2023 08:42   DG CHEST PORT 1 VIEW Result Date: 10/23/2023 CLINICAL DATA:  Shortness of breath EXAM: PORTABLE CHEST 1 VIEW COMPARISON:  X-ray and CT chest without contrast Oct 31, 2023 FINDINGS: Kyphotic x-ray. This limited evaluation of the apices. Enlarged cardiopericardial silhouette. Tortuous ectatic aorta. Left upper chest pacemaker. No pneumothorax. Small effusions. Increasing interstitial changes seen lungs with some patchy parenchymal changes as well. Overlapping cardiac leads. IMPRESSION: Kyphotic  x-ray. Enlarged  cardiopericardial silhouette with pacemaker. Small effusions. Increasing interstitial and parenchymal lung opacities. Recommend follow-up Electronically Signed   By: Karen Kays M.D.   On: 10/23/2023 11:22   CT CHEST WO CONTRAST Result Date: 10/20/2023 CLINICAL DATA:  Pneumonia. Abnormal chest radiograph. Shortness of breath EXAM: CT CHEST WITHOUT CONTRAST TECHNIQUE: Multidetector CT imaging of the chest was performed following the standard protocol without IV contrast. RADIATION DOSE REDUCTION: This exam was performed according to the departmental dose-optimization program which includes automated exposure control, adjustment of the mA and/or kV according to patient size and/or use of iterative reconstruction technique. COMPARISON:  CT 09/21/2023 FINDINGS: Cardiovascular: Coronary artery calcification and aortic atherosclerotic calcification. No pericardial fluid. No acute findings. Mediastinum/Nodes: Scattered prominent mediastinal lymph nodes are similar prior. Largest node is subcarinal measuring 15 mm short axis compared to 12 mm on recent CT scan. Lungs/Pleura: There is patchy ground-glass density superimposed on interstitial thickening. There are mild bronchiectasis in the upper lobes. Small bilateral pleural effusions. Findings are not changed from comparison exam except for the effusions are slightly larger. There is bronchial mucoid material within the RIGHT bronchus intermedius (image 63/6). Upper Abdomen: Limited view of the liver, kidneys, pancreas are unremarkable. Normal adrenal glands. Musculoskeletal: No aggressive osseous lesion. 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 Kyphosis IMPRESSION: 1. Patchy ground-glass density superimposed on interstitial thickening and bronchiectasis. Findings are not changed from comparison exam except for the effusions are slightly larger. Findings are favored to represent pulmonary edema. 2. New bronchial  mucoid material within the RIGHT bronchus intermedius. 3. Stable prominent mediastinal lymph nodes. 4. 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 5.  Aortic Atherosclerosis (ICD10-I70.0). Electronically Signed   By: Genevive Bi M.D.   On: 10/20/2023 14:37   DG Chest Port 1 View Result Date: 10/20/2023 CLINICAL DATA:  shob EXAM: PORTABLE CHEST 1 VIEW COMPARISON:  September 21, 2023 FINDINGS: The cardiomediastinal silhouette is unchanged in contour.Favored small bilateral pleural effusions superimposed on bibasilar pleural blunting. LEFT chest cardiac pacing device. No pneumothorax. Increased diffuse interstitial markings with vascular indistinctness and peribronchial cuffing. Biapical pleuroparenchymal scarring. Hiatal hernia. IMPRESSION: Constellation of findings are favored to reflect pulmonary edema with small bilateral pleural effusions. Atypical infection or outer other inflammatory processes could present similarly. Electronically Signed   By: Meda Klinefelter M.D.   On: 10/20/2023 07:54    Microbiology Recent Results (from the past 240 hours)  Resp Panel by RT-PCR (Flu A&B, Covid)     Status: Abnormal   Collection Time: 10/19/23  8:45 AM  Result Value Ref Range Status   SARS Coronavirus 2 by RT PCR NEGATIVE NEGATIVE Final    Comment: (NOTE) SARS-CoV-2 target nucleic acids are NOT DETECTED.  The SARS-CoV-2 RNA is generally detectable in upper respiratory specimens during the acute phase of infection. The lowest concentration of SARS-CoV-2 viral copies this assay can detect is 138 copies/mL. A negative result does not preclude SARS-Cov-2 infection and should not be used as the sole basis for treatment or other patient management decisions. A negative result may occur with  improper specimen collection/handling, submission of specimen other than nasopharyngeal swab, presence of viral mutation(s) within the areas targeted by this assay, and inadequate number of  viral copies(<138 copies/mL). A negative result must be combined with clinical observations, patient history, and epidemiological information. The expected result is Negative.  Fact Sheet for Patients:  BloggerCourse.com  Fact Sheet for Healthcare Providers:  SeriousBroker.it  This test is no t yet approved or cleared by the [place] and  has been authorized for detection and/or diagnosis of SARS-CoV-2 by FDA under an Emergency Use Authorization (EUA). This EUA will remain  in effect (meaning this test can be used) for the duration of the COVID-19 declaration under Section 564(b)(1) of the Act, 21 U.S.C.section 360bbb-3(b)(1), unless the authorization is terminated  or revoked sooner.       Influenza A by PCR POSITIVE (A) NEGATIVE Final   Influenza B by PCR NEGATIVE NEGATIVE Final    Comment: (NOTE) The Xpert Xpress SARS-CoV-2/FLU/RSV plus assay is intended as an aid in the diagnosis of influenza from Nasopharyngeal swab specimens and should not be used as a sole basis for treatment. Nasal washings and aspirates are unacceptable for Xpert Xpress SARS-CoV-2/FLU/RSV testing.  Fact Sheet for Patients: BloggerCourse.com  Fact Sheet for Healthcare Providers: SeriousBroker.it  This test is not yet approved or cleared by the [place] FDA and has been authorized for detection and/or diagnosis of SARS-CoV-2 by FDA under an Emergency Use Authorization (EUA). This EUA will remain in effect (meaning this test can be used) for the duration of the COVID-19 declaration under Section 564(b)(1) of the Act, 21 U.S.C. section 360bbb-3(b)(1), unless the authorization is terminated or revoked.  Performed at [facility], [address], [place], [place] [address]   Resp panel by RT-PCR (RSV, Flu A&B, Covid) Anterior Nasal Swab     Status: Abnormal   Collection Time: 10/20/23  5:46  AM   Specimen: Anterior Nasal Swab  Result Value Ref Range Status   SARS Coronavirus 2 by RT PCR NEGATIVE NEGATIVE Final    Comment: (NOTE) SARS-CoV-2 target nucleic acids are NOT DETECTED.  The SARS-CoV-2 RNA is generally detectable in upper respiratory specimens during the acute phase of infection. The lowest concentration of SARS-CoV-2 viral copies this assay can detect is 138 copies/mL. A negative result does not preclude SARS-Cov-2 infection and should not be used as the sole basis for treatment or other patient management decisions. A negative result may occur with  improper specimen collection/handling, submission of specimen other than nasopharyngeal swab, presence of viral mutation(s) within the areas targeted by this assay, and inadequate number of viral copies(<138 copies/mL). A negative result must be combined with clinical observations, patient history, and epidemiological information. The expected result is Negative.  Fact Sheet for Patients:  BloggerCourse.com  Fact Sheet for Healthcare Providers:  SeriousBroker.it  This test is no t yet approved or cleared by the [place] FDA and  has been authorized for detection and/or diagnosis of SARS-CoV-2 by FDA under an Emergency Use Authorization (EUA). This EUA will remain  in effect (meaning this test can be used) for the duration of the COVID-19 declaration under Section 564(b)(1) of the Act, 21 U.S.C.section 360bbb-3(b)(1), unless the authorization is terminated  or revoked sooner.       Influenza A by PCR POSITIVE (A) NEGATIVE Final   Influenza B by PCR NEGATIVE NEGATIVE Final    Comment: (NOTE) The Xpert Xpress SARS-CoV-2/FLU/RSV plus assay is intended as an aid in the diagnosis of influenza from Nasopharyngeal swab specimens and should not be used as a sole basis for treatment. Nasal washings and aspirates are unacceptable for Xpert Xpress  SARS-CoV-2/FLU/RSV testing.  Fact Sheet for Patients: BloggerCourse.com  Fact Sheet for Healthcare Providers: SeriousBroker.it  This test is not yet approved or cleared by the [place] FDA and has been authorized for detection and/or diagnosis of  SARS-CoV-2 by FDA under an Emergency Use Authorization (EUA). This EUA will remain in effect (meaning this test can be used) for the duration of the COVID-19 declaration under Section 564(b)(1) of the Act, 21 U.S.C. section 360bbb-3(b)(1), unless the authorization is terminated or revoked.     Resp Syncytial Virus by PCR NEGATIVE NEGATIVE Final    Comment: (NOTE) Fact Sheet for Patients: BloggerCourse.com  Fact Sheet for Healthcare Providers: SeriousBroker.it  This test is not yet approved or cleared by the [place] FDA and has been authorized for detection and/or diagnosis of SARS-CoV-2 by FDA under an Emergency Use Authorization (EUA). This EUA will remain in effect (meaning this test can be used) for the duration of the COVID-19 declaration under Section 564(b)(1) of the Act, 21 U.S.C. section 360bbb-3(b)(1), unless the authorization is terminated or revoked.  Performed at [facility], [address], [place], [place] [address]   MRSA Next Gen by PCR, Nasal     Status: None   Collection Time: 10/21/23  1:00 PM   Specimen: Nasal Mucosa; Nasal Swab  Result Value Ref Range Status   MRSA by PCR Next Gen NOT DETECTED NOT DETECTED Final    Comment: (NOTE) The GeneXpert MRSA Assay (FDA approved for NASAL specimens only), is one component of a comprehensive MRSA colonization surveillance program. It is not intended to diagnose MRSA infection nor to guide or monitor treatment for MRSA infections. Test performance is not FDA approved in patients less than 38 years old. Performed at [facility], [address] [place],  [place] [address]     Lab Basic Metabolic Panel: Recent Labs  Lab 10/23/23 0534 10/24/23 0351 10/24/23 0909 10/25/23 0247 10/26/23 0705 10/27/23 0008 10/22/2023 0406  NA 135 138  --  137 135 132* 137  K 3.4* 3.4*  --  3.4* 4.1 4.1 4.6  CL 96*  --   --  96* 100 96* 96*  CO2 28  --   --  30 28 28 31   GLUCOSE 230*  --   --  150* 155* 284* 188*  BUN 26*  --   --  35* 36* 42* 43*  CREATININE 0.84  --   --  1.25* 0.95 0.79 0.88  CALCIUM 8.9  --   --  9.1 9.1 9.3 9.3  MG  --   --  2.2 2.0 2.2 2.0 2.0  PHOS  --   --   --  2.4* 3.4 2.9 3.6   Liver Function Tests: No results for input(s): "AST", "ALT", "ALKPHOS", "BILITOT", "PROT", "ALBUMIN" in the last 168 hours. No results for input(s): "LIPASE", "AMYLASE" in the last 168 hours. No results for input(s): "AMMONIA" in the last 168 hours. CBC: Recent Labs  Lab 10/23/23 0534 10/24/23 0351 10/25/23 0247 10/26/23 0705 10/27/23 0008 10/10/2023 0406  WBC 5.9  --  4.2 7.3 8.3 8.2  HGB 10.6* 9.5* 9.1* 10.2* 10.6* 10.0*  HCT 32.3* 28.0* 26.8* 29.8* 31.7* 30.3*  MCV 95.8  --  93.7 93.4 93.5 96.2  PLT 105*  --  86* 102* 126* 110*   Cardiac Enzymes: Recent Labs  Lab 10/22/23 1925  CKTOTAL 20*   Sepsis Labs: Recent Labs  Lab 10/22/23 1404 10/23/23 0534 10/25/23 0247 10/26/23 0705 10/27/23 0008 10/24/2023 0406  PROCALCITON 0.18  --   --   --   --   --   WBC  --    < > 4.2 7.3 8.3 8.2  LATICACIDVEN 4.0*  --   --   --   --   --    < > =  values in this interval not displayed.

## 2023-11-10 NOTE — Progress Notes (Signed)
 Pt taken off HHFNC for comfort care. RN at bedside, CCM MD aware.

## 2023-11-10 NOTE — Progress Notes (Signed)
 SLP Cancellation Note  Patient Details Name: YARETZY OLAZABAL MRN: [id number] DOB: 07/28/1941   Cancelled treatment:       Reason Eval/Treat Not Completed: Patient not medically ready. Pts respiratory status too impaired for oral intake at this time. Discussed with MD. Will defer FEES at this time. Will follow chart for needs.    Tessica Cupo, Riley Nearing 11/03/2023, 9:14 AM

## 2023-11-10 NOTE — Progress Notes (Signed)
 OT Cancellation Note  Patient Details Name: Lauren Baker MRN: [id number] DOB: 15-May-1941   Cancelled Treatment:    Reason Eval/Treat Not Completed: Medical issues which prohibited therapy (family present in room, per RN asking to hold at this time due to medical status. Will check back and follow up for OT tx as appropriate)  Carver Fila, OTD, OTR/L [facility] [phone]   Dalphine Handing 10/14/2023, 1:26 PM

## 2023-11-10 NOTE — Progress Notes (Signed)
 NAME:  Lauren Baker, MRN:  [id number], DOB:  1941-08-08, LOS: 8 ADMISSION DATE:  10/20/2023, CONSULTATION DATE:  10/22/23 REFERRING MD:  Lauren Bloodgood, MD CHIEF COMPLAINT:  Resp Failure/Flu A   History of Present Illness:  Lauren Baker is an 83 year old woman with atrial fibrillation and hypertension who is admitted to [facility] with acute hypoxemic respiratory failure due to influenza A pneumonia requiring 13-15L of oxygen.   She is being treated with oseltamavir and empiric antibiotics with cefepime. Vancomycin discontinued as MRSA screen was negative. She is receiving IV solumedrol 60mg  IV twice daily and budesonide, brovana and atrovent nebs. She was started on amiodarone for atrial fibrillation with RVR which is better rate controlled. She has been consistently on eliquis as outpatient and remains on it since being in the hospital.   Family reports she had second hand smoke from her husband for many years. Never smoked herself. Has otherwise been healthy and not been sick like this before. She has productive cough, some blood tinged secretions.   The patient had a recent hospital admission from 09/21/2023 to 09/24/2023 for acute respiratory failure. The patient was initially started on antibiotics, but these were discontinued. The patient was discharged with a prednisone taper for alveolitis. She was discharged on 4 L. She has been since weaned to 1.5 L. The patient had been doing fairly well up until 10/19/2023. She had been making progress with her PT and ambulating with a walker over 100 feet. She did develop some coughing and chest congestion. In the evening of 10/19/2023, the patient began having worsening shortness of breath. She was noted to be hypoxic with oxygen saturation in the 70s. She was transferred to the emergency department further evaluation and treatment.   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 Continued on BiPAP 3/15 Transition to high flow nasal cannula, Foley catheter placed for urinary retention 3/16 held diuresis 2/2 AKI. On diet  Interim History / Subjective:  RVR with borderline pressures. Agitated and tachypneic. Heart rates >130 despite IV diltiazem and and metoprolol.  This morning patient was examined at the bedside and continued to be tachypneic, responding intermittently to questions with yes and no answers.  Family was at bedside after our conversation on 3/18.  At 12:30 PM, we have another family meeting with an spouses. We discussed her recent illness prior to this hospitalization and all the treatment measures that we have done thus far to keep her stabilized and is comfortable as she can be.  Family shared with me that their desire to not proceed with cortrack placement.  They reminisced about Ms. Lauren Baker life and her desires.  They also share with me that she has started to speak with some of her family members who have passed.  We discussed what comfort measures mean and what it may look like.  After a lengthy and heartfelt discussion, through shared decision making family concluded to transition Ms. Lauren Baker to comfort care.  They would like time to say goodbyes prior to changing her current medications.  They also requested a chaplain at the bedside.  It is important for the family to keep Ms. Lauren Baker pain-and anxiety free. Her morning nurse was part of this conversation.     Objective  Blood pressure 109/79, pulse (!) 129, temperature 99.5 F (37.5 C), temperature source Oral, resp. rate 18, height 5\' 9"  (1.753 m), weight 82.9 kg, SpO2 100%.    FiO2 (%):  [94 %-100 %] 94 %   Intake/Output Summary (Last 24 hours) at 11/09/2023  0604 Last data filed at 10/17/2023 0000 Gross per 24 hour  Intake 241.19 ml  Output 900 ml  Net -658.81 ml   Filed Weights   10/21/23 2300 10/24/23 0500 10/25/23 0500  Weight: 82.3 kg 82.6 kg 82.9 kg   Examination: Gen:     Elderly woman in moderate distress HEENT:  EOMI, sclera anicteric Neck:     No massess. Supple.  Lungs:    Rhonchi throughout; crackles in bilateral bases.  No wheezing CV:         Tachycardia >140, irregular Abd:      Soft non tender, non distended Ext:   Warm and dry Neuro:    Intermittently awake, intermittently at responding to yes/no questions oriented to self.  Recognizes daughter.  Not oriented to time, place, difficult to ascertain whether she knows her current status.  Pertinent labs Na 137 K 4.6 BUN 43 Cr 0.88 Mg 2.0 Phosp 3.6 -  Pertinent imaging 3/15: interstitial lung opacities with RLL  Chest x-ray 3/15-stable small effusion, interstitial opacities CT 3/11 - Pulmonary edema/ R bronchus intermedius with mucoid mater 3/18 CXR: increased diffused  alveolar opacities   Resolved Hospital Problem list     Assessment & Plan:   Lauren Baker is a 83yo F with hx of atrial fibrillation, HTN, and acute respiratory failure due to influenza, acute on chronic heart failure, and atrial fibrillation with rapid ventricular response who was admitted to the ICU on 10/22/2023 now with failure to thrive thrive, DNR/DNI, who his family members made shared decision making to transition to comfort care given poor functional status and prognosis  COMFORT CARE Acute hypoxemic respiratory failure Influenza pneumonia Permanent Atrial fibrillation with RVR Acute on Chronic HFpEF - Chaplain consult placed - Comfort orders placed - Expect in hospital death  Tachybrady syndrome - Has pacemanker  Best Practice (right click and "Reselect all SmartList Selections" daily)   Diet/type: dysphagia diet (see orders) DVT prophylaxis: DOAC GI prophylaxis:  PPI Lines: N/A Foley:  Yes, and it is still needed Code Status:  full code Last date of multidisciplinary goals of care discussion [--]  Critical care time:    Lauren Crocker, MD [facility] - PGY-2 10/30/2023, 6:04 AM

## 2023-11-10 DEATH — deceased

## 2024-01-25 ENCOUNTER — Ambulatory Visit: Payer: Medicare HMO | Admitting: Cardiology
